# Patient Record
Sex: Male | Born: 1955 | Race: White | Hispanic: No | Marital: Married | Smoking: Never smoker
Health system: Southern US, Community
[De-identification: ages and names within clinical notes are randomized; demographics above are authoritative.]

## PROBLEM LIST (undated history)

## (undated) DIAGNOSIS — H409 Unspecified glaucoma: Secondary | ICD-10-CM

## (undated) DIAGNOSIS — K219 Gastro-esophageal reflux disease without esophagitis: Secondary | ICD-10-CM

## (undated) DIAGNOSIS — E119 Type 2 diabetes mellitus without complications: Secondary | ICD-10-CM

## (undated) DIAGNOSIS — I10 Essential (primary) hypertension: Secondary | ICD-10-CM

## (undated) DIAGNOSIS — D126 Benign neoplasm of colon, unspecified: Secondary | ICD-10-CM

## (undated) DIAGNOSIS — G4730 Sleep apnea, unspecified: Secondary | ICD-10-CM

## (undated) DIAGNOSIS — K649 Unspecified hemorrhoids: Secondary | ICD-10-CM

## (undated) DIAGNOSIS — R739 Hyperglycemia, unspecified: Secondary | ICD-10-CM

## (undated) DIAGNOSIS — E291 Testicular hypofunction: Secondary | ICD-10-CM

## (undated) DIAGNOSIS — E785 Hyperlipidemia, unspecified: Secondary | ICD-10-CM

## (undated) DIAGNOSIS — H269 Unspecified cataract: Secondary | ICD-10-CM

## (undated) DIAGNOSIS — K648 Other hemorrhoids: Secondary | ICD-10-CM

## (undated) HISTORY — DX: Essential (primary) hypertension: I10

## (undated) HISTORY — DX: Hyperglycemia, unspecified: R73.9

## (undated) HISTORY — DX: Sleep apnea, unspecified: G47.30

## (undated) HISTORY — DX: Unspecified glaucoma: H40.9

## (undated) HISTORY — DX: Type 2 diabetes mellitus without complications: E11.9

## (undated) HISTORY — PX: EYE SURGERY: SHX253

## (undated) HISTORY — DX: Hyperlipidemia, unspecified: E78.5

## (undated) HISTORY — PX: COLONOSCOPY: SHX174

## (undated) HISTORY — DX: Unspecified hemorrhoids: K64.9

## (undated) HISTORY — DX: Benign neoplasm of colon, unspecified: D12.6

## (undated) HISTORY — DX: Unspecified cataract: H26.9

## (undated) HISTORY — DX: Testicular hypofunction: E29.1

## (undated) HISTORY — DX: Other hemorrhoids: K64.8

## (undated) HISTORY — DX: Gastro-esophageal reflux disease without esophagitis: K21.9

---
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
## 1998-11-05 HISTORY — PX: LIPOMA EXCISION: SHX5283

## 2005-09-07 ENCOUNTER — Ambulatory Visit: Payer: Self-pay | Admitting: Family Medicine

## 2005-09-26 ENCOUNTER — Encounter: Admission: RE | Admit: 2005-09-26 | Discharge: 2005-09-26 | Payer: Self-pay | Admitting: Family Medicine

## 2005-10-19 ENCOUNTER — Ambulatory Visit: Payer: Self-pay | Admitting: Family Medicine

## 2006-07-27 ENCOUNTER — Emergency Department (HOSPITAL_COMMUNITY): Admission: EM | Admit: 2006-07-27 | Discharge: 2006-07-27 | Payer: Self-pay | Admitting: Emergency Medicine

## 2006-10-02 ENCOUNTER — Ambulatory Visit (HOSPITAL_COMMUNITY): Admission: RE | Admit: 2006-10-02 | Discharge: 2006-10-02 | Payer: Self-pay | Admitting: Gastroenterology

## 2008-02-25 ENCOUNTER — Ambulatory Visit (HOSPITAL_COMMUNITY): Admission: RE | Admit: 2008-02-25 | Discharge: 2008-02-25 | Payer: Self-pay | Admitting: Gastroenterology

## 2008-02-25 ENCOUNTER — Encounter (INDEPENDENT_AMBULATORY_CARE_PROVIDER_SITE_OTHER): Payer: Self-pay | Admitting: Gastroenterology

## 2008-10-26 ENCOUNTER — Emergency Department (HOSPITAL_COMMUNITY): Admission: EM | Admit: 2008-10-26 | Discharge: 2008-10-26 | Payer: Self-pay | Admitting: Emergency Medicine

## 2010-11-26 ENCOUNTER — Encounter: Payer: Self-pay | Admitting: Family Medicine

## 2011-01-09 LAB — FECAL OCCULT BLOOD, GUAIAC: Fecal Occult Blood: NEGATIVE

## 2011-01-23 ENCOUNTER — Encounter: Payer: Self-pay | Admitting: Family Medicine

## 2011-01-23 DIAGNOSIS — H409 Unspecified glaucoma: Secondary | ICD-10-CM | POA: Insufficient documentation

## 2011-01-23 DIAGNOSIS — E349 Endocrine disorder, unspecified: Secondary | ICD-10-CM | POA: Insufficient documentation

## 2011-01-23 DIAGNOSIS — I10 Essential (primary) hypertension: Secondary | ICD-10-CM | POA: Insufficient documentation

## 2011-01-23 DIAGNOSIS — E785 Hyperlipidemia, unspecified: Secondary | ICD-10-CM

## 2011-01-23 DIAGNOSIS — E291 Testicular hypofunction: Secondary | ICD-10-CM

## 2011-01-23 DIAGNOSIS — R739 Hyperglycemia, unspecified: Secondary | ICD-10-CM | POA: Insufficient documentation

## 2011-01-23 DIAGNOSIS — K649 Unspecified hemorrhoids: Secondary | ICD-10-CM | POA: Insufficient documentation

## 2011-01-23 DIAGNOSIS — K219 Gastro-esophageal reflux disease without esophagitis: Secondary | ICD-10-CM | POA: Insufficient documentation

## 2011-01-23 DIAGNOSIS — N400 Enlarged prostate without lower urinary tract symptoms: Secondary | ICD-10-CM | POA: Insufficient documentation

## 2011-03-20 NOTE — Op Note (Signed)
NAMECUINN, WESTERHOLD             ACCOUNT NO.:  [id number]   MEDICAL RECORD NO.:  [id number]          PATIENT TYPE:  AMB   LOCATION:  ENDO                         FACILITY:  [facility]   PHYSICIAN:  Anselmo Rod, M.D.  DATE OF BIRTH:  [birth year]   DATE OF PROCEDURE:  02/25/2008  DATE OF DISCHARGE:                               OPERATIVE REPORT   PROCEDURE PERFORMED:  Screening colonoscopy.   ENDOSCOPIST:  Anselmo Rod, M.D.   INSTRUMENT USED:  Pentax video panendoscope.   INDICATIONS FOR PROCEDURE:  This is a 55 year old white male underwent  a screening colonoscopy to rule out colonic polyps, mass, etc.   PREPROCEDURE PREPARATION:  Informed consent was obtained from the  patient and the patient had fasted for four hours prior to the procedure  and prep with 20 Osmoprep pills the night before and 12 pills the  morning of the procedure. The risks and benefits of the procedure  including a 10% risk for cancer and polyp were discussed with the  patient as well.   PREPROCEDURE PHYSICAL:  VITAL SIGNS: The patient had stable vital signs.  NECK: Supple.  CHEST: Clear to auscultation.  HEART: S1, S2 regular.  ABDOMEN: Soft with normal bowel sounds.   DESCRIPTION OF PROCEDURE:  The patient was placed in the left lateral  decubitus position. Sedated with Fentanyl and Versed for the EGD. No  additional sedation  was used for the colonoscopy. Once the patient was  adequately sedated and maintained on normal flow oxygen, cardiac  monitoring. The Pentax video colonoscope was advanced from the rectum to  the cecum. The appendiceal orifice was thoroughly visualized and  photographed. The terminal ileum appeared healthy without lesions. No  masses, polyps, erosions, ulcerations or diverticula were noted.  Small  internal hemorrhoids were appreciated on retroflexion in the rectum. The  patient tolerated the procedure well without any complications.   IMPRESSION:  Normal colonoscopy of  the terminal  ileum except for small  internal hemorrhoids seen on retroflexion.   RECOMMENDATIONS:  1. Continue a high fiber diet with adequate fluid intake.  2. Repeat colonoscopy in the next ten years unless the patient has any      abnormal symptoms in which case he should contact the office      immediately for  further recommendations.      Anselmo Rod, M.D.  Electronically Signed     JNM/MEDQ  D:  02/26/2008  T:  02/26/2008  Job:  [id number]   cc:   Ernestina Penna, M.D.  Fax: [phone]

## 2011-03-23 NOTE — Op Note (Signed)
Terry [name], Terry [name]             ACCOUNT NO.:  [id number]   MEDICAL RECORD NO.:  [id number]          PATIENT TYPE:  AMB   LOCATION:  ENDO                         FACILITY:  [facility]   PHYSICIAN:  Anselmo Rod, M.D.  DATE OF BIRTH:  1955-11-08   DATE OF PROCEDURE:  02/26/2008  DATE OF DISCHARGE:                               OPERATIVE REPORT   PROCEDURE PERFORMED:  Esophagogastroduodenoscopy with gastric biopsies.   ENDOSCOPIST:  Anselmo Rod, M.D.   INSTRUMENT USED:  Pentax video panendoscope.   INDICATIONS FOR PROCEDURE:  A 55 year old white male with a history of  epigastric pain, reflux on PPI undergoing EGD to rule out peptic ulcer  disease, esophagitis, gastritis, etc.   PREPROCEDURE PREPARATION:  Informed consent was procured from the  patient.  The patient fasted for 4 hours prior to the procedure and  risks and benefits of the procedure were discussed with the patient in  detail.   PREPROCEDURE PHYSICAL:  VITAL SIGNS:  Patient with stable vital signs.  NECK:  Supple.  CHEST:  Clear to auscultation.  HEART:  S1, S2 regular.  ABDOMEN:  Soft with normal bowel sounds.   DESCRIPTION OF PROCEDURE:  The patient was placed in the left lateral  decubitus position and sedated with 75 mcg of fentanyl and 5 mg of  Versed given intravenously in slow incremental doses.  Once the patient  was adequately sedated, maintained on low-flow oxygen, and continuous  cardiac monitoring, the Pentax video panendoscope was advanced through  the mouthpiece over the tongue into the esophagus under direct vision.  Grade 1 distal esophagitis was noted the rest of the esophagus was  widely patent with no evidence of ulcers or erosions.  The scope was  then advanced into the stomach.  Moderate diffuse gastritis was noted.  Antral biopsies were done to rule out the presence of H. pylori by  pathology.  The proximal small bowel appeared normal.  Retroflexion in  the high cardia revealed no  abnormalities.  The patient tolerated the  procedure well without immediate complications.   IMPRESSION:  1. Grade 1 distal esophagitis.  2. Diffuse gastritis.  Antral biopsies done for Helicobacter pylori.  3. Normal proximal small bowel.   RECOMMENDATIONS:  1. Continue PPI.  2. Avoid nonsteroidals.  3. Await pathology results.  4. Treat with antibiotics if H. pylori present on biopsies.  5. Proceed with a colonoscopy at this time.  Further recommendations      to be made thereafter.      Anselmo Rod, M.D.  Electronically Signed     JNM/MEDQ  D:  02/26/2008  T:  02/26/2008  Job:  [id number]   cc:   Ernestina Penna, M.D.  Fax: [phone]

## 2011-03-23 NOTE — Op Note (Signed)
NAMELAVONNE, CASS             ACCOUNT NO.:  [id number]   MEDICAL RECORD NO.:  [id number]          PATIENT TYPE:  AMB   LOCATION:  ENDO                         FACILITY:  [facility]   PHYSICIAN:  Anselmo Rod, M.D.  DATE OF BIRTH:  1956/01/22   DATE OF PROCEDURE:  10/02/2006  DATE OF DISCHARGE:                               OPERATIVE REPORT   PROCEDURE PERFORMED:  A screening colonoscopy.   ENDOSCOPIST:  Anselmo Rod, M.D.   INSTRUMENT USED:  Olympus video colonoscope.   INDICATIONS FOR PROCEDURE:  55 year old white male undergoing screening  colonoscopy to rule out colonic polyps, mass, etc.   PREPROCEDURE PREPARATION:  Informed consent was procured from the  patient.  The patient was fasted for 8 hours prior to the procedure and  prepped with Dulcolax pills and a gallon of TriLyte the night prior to  procedure.  Risks and benefits of the procedure including a 10% miss  rate of cancer and polyp were discussed with the patient as well.   PREPROCEDURE PHYSICAL:  The patient had stable vital signs.  NECK:  Supple.  Chest clear to auscultation.  S1, S2 regular.  Abdomen soft  with normal bowel sounds.   DESCRIPTION OF PROCEDURE:  The patient was placed in the left lateral  decubitus position and sedated with 100 mcg of fentanyl and 7.5 mg of  Versed given intravenously in slow incremental doses.  Once the patient  was adequately sedated and maintained on low-flow oxygen and continuous  cardiac monitoring, the Olympus video colonoscope was advanced from the  rectum to cecum.  The appendiceal orifice and ileocecal valve were  visualized and photographed.  The patient has significant amount of  residual stool in the colon.  No masses, polyps, erosions, ulcerations  or diverticula were seen.  Small internal hemorrhoids were seen on  retroflexion in the rectum.  The patient tolerated the procedure well  without complication.  Small lesions could be missed.   IMPRESSION:  1.  Normal colonoscopy up to the cecum.  No masses, polyps or      diverticula seen.  2. Small internal hemorrhoids seen on retroflexion.  3. Large amount of residual stool in the colon.  Small lesions could      be missed.   RECOMMENDATIONS:  1. Continue high fiber diet with liberal fluid intake.  2. Repeat colonoscopy in the 5 years unless the patient develops any      abnormal symptoms in interim.  3. Outpatient follow-up as need arises in the future.      Anselmo Rod, M.D.  Electronically Signed     JNM/MEDQ  D:  10/02/2006  T:  10/02/2006  Job:  [id number]   cc:   Fleet Contras, M.D.

## 2011-08-10 LAB — URINALYSIS, ROUTINE W REFLEX MICROSCOPIC
Bilirubin Urine: NEGATIVE
Glucose, UA: NEGATIVE mg/dL
Ketones, ur: NEGATIVE mg/dL
Protein, ur: NEGATIVE mg/dL

## 2013-02-18 ENCOUNTER — Telehealth: Payer: Self-pay | Admitting: Family Medicine

## 2013-02-18 NOTE — Telephone Encounter (Signed)
LMOM

## 2013-02-19 ENCOUNTER — Ambulatory Visit (INDEPENDENT_AMBULATORY_CARE_PROVIDER_SITE_OTHER): Admitting: Family Medicine

## 2013-02-19 ENCOUNTER — Encounter: Payer: Self-pay | Admitting: Family Medicine

## 2013-02-19 VITALS — BP 118/70 | HR 52 | Temp 98.0°F | Ht 68.25 in | Wt 177.2 lb

## 2013-02-19 DIAGNOSIS — I10 Essential (primary) hypertension: Secondary | ICD-10-CM

## 2013-02-19 DIAGNOSIS — R7309 Other abnormal glucose: Secondary | ICD-10-CM

## 2013-02-19 DIAGNOSIS — E349 Endocrine disorder, unspecified: Secondary | ICD-10-CM

## 2013-02-19 DIAGNOSIS — E785 Hyperlipidemia, unspecified: Secondary | ICD-10-CM

## 2013-02-19 DIAGNOSIS — Z79899 Other long term (current) drug therapy: Secondary | ICD-10-CM

## 2013-02-19 DIAGNOSIS — E291 Testicular hypofunction: Secondary | ICD-10-CM

## 2013-02-19 DIAGNOSIS — K649 Unspecified hemorrhoids: Secondary | ICD-10-CM

## 2013-02-19 DIAGNOSIS — E559 Vitamin D deficiency, unspecified: Secondary | ICD-10-CM

## 2013-02-19 LAB — POCT CBC
Granulocyte percent: 45.1 %G (ref 37–80)
Lymph, poc: 3 (ref 0.6–3.4)
MCV: 90.2 fL (ref 80–97)
MPV: 8.6 fL (ref 0–99.8)
POC Granulocyte: 2.8 (ref 2–6.9)
Platelet Count, POC: 248 10*3/uL (ref 142–424)
RBC: 4.7 M/uL (ref 4.69–6.13)
RDW, POC: 13.3 %

## 2013-02-19 LAB — HEPATIC FUNCTION PANEL
Bilirubin, Direct: 0.1 mg/dL (ref 0.0–0.3)
Indirect Bilirubin: 0.5 mg/dL (ref 0.0–0.9)
Total Bilirubin: 0.6 mg/dL (ref 0.3–1.2)

## 2013-02-19 LAB — BASIC METABOLIC PANEL WITH GFR
Chloride: 104 mEq/L (ref 96–112)
GFR, Est African American: >89 mL/min
GFR, Est Non African American: 79 mL/min
Potassium: 4.7 mEq/L (ref 3.5–5.3)

## 2013-02-19 NOTE — Patient Instructions (Addendum)
Continue current meds and therapeutic lifestyle changes Will hold Testim, see how he feels until we see him the next visit For allergic rhinitis try Nasacort AQ over-the-counter 1-2 sprays each nostril daily

## 2013-02-19 NOTE — Telephone Encounter (Signed)
Wants [place] to send labs to participating lab with their insurance She will find out which lab this is and let us know for the future

## 2013-02-19 NOTE — Progress Notes (Signed)
  Subjective:    Patient ID: Terry [name], male    DOB: March 03, 1956, 57 y.o.   MRN: [id number]  HPI This patient presents for recheck of multiple medical problems. No one accompanies the patient today.  Patient Active Problem List  Diagnosis  . Esophageal reflux  . Elevated blood sugar  . BPH (benign prostatic hyperplasia)  . Unspecified hemorrhoids without mention of complication  . testosterone deficiency  . hyperlipidemia  . Essential hypertension, benign  . Unspecified glaucoma    In addition, see ROS. Also has concerns about recurring hemorrhoid problems. That he ran out of testim  about a month ago, and he is not felt any different energy wise.  The allergies, current medications, past medical history, surgical history, family and social history are reviewed.  Immunizations reviewed.  Health maintenance reviewed.  The following items are outstanding:none.      Review of Systems  Constitutional: Negative.   HENT: Negative.   Eyes: Negative.   Respiratory: Negative.   Cardiovascular: Negative.   Gastrointestinal: Negative.   Genitourinary: Negative.   Musculoskeletal: Negative.   Allergic/Immunologic: Positive for environmental allergies (sesonal).  Neurological: Negative.   Psychiatric/Behavioral: Negative.        Objective:   Physical Exam BP 118/70  Pulse 52  Temp(Src) 98 F (36.7 C) (Oral)  Ht 5' 8.25" (1.734 m)  Wt 177 lb 3.2 oz (80.377 kg)  BMI 26.73 kg/m2  The patient appeared well nourished and normally developed, alert and oriented to time and place. Speech, behavior and judgement appear normal. Vital signs as documented.  Head exam is unremarkable. No scleral icterus or pallor noted. Nasal congestion bilaterally. Neck is without jugular venous distension, thyromegally, or carotid bruits. Carotid upstrokes are brisk bilaterally. No cervical adenopathy. Lungs are clear anteriorly and posteriorly to auscultation. Normal respiratory  effort. Cardiac exam reveals regular rate and rhythm. First and second heart sounds normal. No murmurs, rubs or gallops.  Abdominal exam reveals normal bowl sounds, no masses, no organomegaly and no aortic enlargement. No inguinal adenopathy.He has small external hemorrhoids. Extremities are nonedematous and both femoral and pedal pulses are normal. Skin without pallor or jaundice.  Warm and dry, without rash. Neurologic exam reveals normal deep tendon reflexes and normal sensation. Diabetic foot exam done today.         Assessment & Plan:  1. Essential hypertension, benign  2. Hyperlipemia - Hepatic function panel; Standing - NMR Lipoprofile with Lipids; Standing - Hepatic function panel - NMR Lipoprofile with Lipids  3. Testosterone deficiency - Testosterone, Total & Free Direct  4. Vitamin D deficiency - Vitamin D 25 hydroxy; Standing - Vitamin D 25 hydroxy  5. High risk medication use - POCT CBC; Standing - BASIC METABOLIC PANEL WITH GFR; Standing - POCT CBC - BASIC METABOLIC PANEL WITH GFR  6. Hemorrhoids Continue using hemorrhoid creams. Problem gets worse patient will let me know and we will get a surgeon to see him Patient Instructions  Continue current meds and therapeutic lifestyle changes Will hold Testim, see how he feels until we see him the next visit For allergic rhinitis try Nasacort AQ over-the-counter 1-2 sprays each nostril daily

## 2013-02-20 LAB — NMR LIPOPROFILE WITH LIPIDS
Cholesterol, Total: 132 mg/dL (ref <200)
HDL Particle Number: 46 umol/L (ref >=30.5)
HDL Size: 9.1 nm — ABNORMAL LOW (ref >=9.2)
HDL-C: 57 mg/dL (ref >=40)
LDL (calc): 58 mg/dL (ref <100)
LDL Particle Number: 646 nmol/L (ref <1000)
LDL Size: 19.7 nm — ABNORMAL LOW (ref >20.5)
LP-IR Score: 44 (ref <=45)
Large HDL-P: 3.2 umol/L — ABNORMAL LOW (ref >=4.8)
Large VLDL-P: 1.7 nmol/L (ref <=2.7)
Small LDL Particle Number: 491 nmol/L (ref <=527)
Triglycerides: 87 mg/dL (ref <150)
VLDL Size: 44.6 nm (ref <=46.6)

## 2013-02-20 LAB — VITAMIN D 25 HYDROXY (VIT D DEFICIENCY, FRACTURES): Vit D, 25-Hydroxy: 59 ng/mL (ref 30–89)

## 2013-02-24 LAB — POCT GLYCOSYLATED HEMOGLOBIN (HGB A1C): Hemoglobin A1C: 5.6

## 2013-02-24 NOTE — Addendum Note (Signed)
Addended by: Orma Render F on: 02/24/2013 03:20 PM   Modules accepted: Orders

## 2013-03-11 ENCOUNTER — Other Ambulatory Visit: Payer: Self-pay | Admitting: *Deleted

## 2013-03-11 MED ORDER — CANDESARTAN CILEXETIL 16 MG PO TABS: 16.0000 mg | ORAL_TABLET | Freq: Every day | ORAL | Status: DC

## 2013-04-15 ENCOUNTER — Other Ambulatory Visit: Payer: Self-pay | Admitting: *Deleted

## 2013-04-15 MED ORDER — OMEGA-3-ACID ETHYL ESTERS 1 G PO CAPS: 2.0000 g | ORAL_CAPSULE | Freq: Two times a day (BID) | ORAL | Status: DC

## 2013-04-15 NOTE — Telephone Encounter (Signed)
Have kay call pt. When printed to pick up at [phone]

## 2013-04-16 NOTE — Telephone Encounter (Signed)
Pt.notified

## 2013-04-28 ENCOUNTER — Other Ambulatory Visit: Payer: Self-pay | Admitting: Family Medicine

## 2013-06-22 ENCOUNTER — Ambulatory Visit: Admitting: Family Medicine

## 2013-07-02 ENCOUNTER — Other Ambulatory Visit: Payer: Self-pay | Admitting: Family Medicine

## 2013-07-16 ENCOUNTER — Ambulatory Visit: Payer: Self-pay | Admitting: Family Medicine

## 2013-08-24 ENCOUNTER — Ambulatory Visit (INDEPENDENT_AMBULATORY_CARE_PROVIDER_SITE_OTHER)

## 2013-08-24 ENCOUNTER — Ambulatory Visit (INDEPENDENT_AMBULATORY_CARE_PROVIDER_SITE_OTHER): Admitting: Family Medicine

## 2013-08-24 ENCOUNTER — Encounter (INDEPENDENT_AMBULATORY_CARE_PROVIDER_SITE_OTHER): Payer: Self-pay

## 2013-08-24 ENCOUNTER — Encounter: Payer: Self-pay | Admitting: Family Medicine

## 2013-08-24 VITALS — BP 135/85 | HR 69 | Temp 97.5°F | Ht 68.25 in | Wt 179.0 lb

## 2013-08-24 DIAGNOSIS — N400 Enlarged prostate without lower urinary tract symptoms: Secondary | ICD-10-CM

## 2013-08-24 DIAGNOSIS — K219 Gastro-esophageal reflux disease without esophagitis: Secondary | ICD-10-CM

## 2013-08-24 DIAGNOSIS — E559 Vitamin D deficiency, unspecified: Secondary | ICD-10-CM

## 2013-08-24 DIAGNOSIS — I10 Essential (primary) hypertension: Secondary | ICD-10-CM

## 2013-08-24 DIAGNOSIS — E119 Type 2 diabetes mellitus without complications: Secondary | ICD-10-CM

## 2013-08-24 DIAGNOSIS — E291 Testicular hypofunction: Secondary | ICD-10-CM

## 2013-08-24 DIAGNOSIS — E785 Hyperlipidemia, unspecified: Secondary | ICD-10-CM

## 2013-08-24 MED ORDER — GLUCOSE BLOOD VI STRP: ORAL_STRIP | Status: DC

## 2013-08-24 NOTE — Patient Instructions (Addendum)
Continue current medications. Continue good therapeutic lifestyle changes.  Fall precautions discussed with patient. Follow up as planned and earlier as needed.  You will be given FOBT today to return.  Try to work on the weight and get more exercise

## 2013-08-24 NOTE — Addendum Note (Signed)
Addended by: Magdalene River on: 08/24/2013 11:04 AM   Modules accepted: Orders

## 2013-08-24 NOTE — Progress Notes (Signed)
Subjective:    Patient ID: Lavance Beazer, male    DOB: 1956-06-29, 57 y.o.   MRN: [id number]  HPI Pt here for follow up and management of chronic medical problems. Patient notes that he has been under a lot of stress over the past several months. He had a recent administer retreat and indicates this is helping some. Also of note he has not been using the Testim for several months and he says that being off of this he cannot tell that it helped him anymore than being on it so he continues to not use the Testim he brings in his blood sugars and blood pressures from outside and these were reviewed and will be scanned into the chart.      Patient Active Problem List   Diagnosis Date Noted  . Esophageal reflux   . Elevated blood sugar   . BPH (benign prostatic hyperplasia)   . Unspecified hemorrhoids without mention of complication   . testosterone deficiency   . hyperlipidemia   . Essential hypertension, benign   . Unspecified glaucoma    Outpatient Encounter Prescriptions as of 08/24/2013  Medication Sig Dispense Refill  . aspirin 81 MG EC tablet Take 81 mg by mouth daily.        Marland Kitchen atorvastatin (LIPITOR) 20 MG tablet TAKE 1 TABLET DAILY AS DIRECTED  90 tablet  1  . candesartan (ATACAND) 16 MG tablet Take 1 tablet (16 mg total) by mouth daily.  90 tablet  4  . Cholecalciferol (VITAMIN D3) 2000 UNITS TABS Take 2 tablets by mouth daily.       Marland Kitchen omega-3 acid ethyl esters (LOVAZA) 1 G capsule Take 2 capsules (2 g total) by mouth 2 (two) times daily.  480 capsule  3  . TRILIPIX 135 MG capsule TAKE 1 CAPSULE DAILY  90 capsule  0  . testosterone (TESTIM) 50 MG/5GM GEL Place 5 g onto the skin daily.       No facility-administered encounter medications on file as of 08/24/2013.    Review of Systems  Constitutional: Negative.   HENT: Negative.   Eyes: Negative.   Respiratory: Negative.   Cardiovascular: Negative.   Gastrointestinal: Negative.   Endocrine: Negative.   Genitourinary:  Negative.   Musculoskeletal: Negative.   Skin: Negative.   Allergic/Immunologic: Negative.   Neurological: Negative.   Hematological: Negative.   Psychiatric/Behavioral: Negative.        Objective:   Physical Exam  Nursing note and vitals reviewed. Constitutional: He is oriented to person, place, and time. He appears well-developed and well-nourished. No distress.  HENT:  Head: Normocephalic and atraumatic.  Right Ear: External ear normal.  Left Ear: External ear normal.  Nose: Nose normal.  Mouth/Throat: Oropharynx is clear and moist. No oropharyngeal exudate.  Eyes: Conjunctivae and EOM are normal. Right eye exhibits no discharge. Left eye exhibits no discharge. No scleral icterus.  Neck: Normal range of motion. Neck supple. No tracheal deviation present. No thyromegaly present.  No bruits in the neck  Cardiovascular: Normal rate, regular rhythm, normal heart sounds and intact distal pulses.  Exam reveals no gallop and no friction rub.   No murmur heard. At 84 per minute  Pulmonary/Chest: Effort normal and breath sounds normal. No respiratory distress. He has no wheezes. He has no rales.  No axillary nodes  Abdominal: Soft. Bowel sounds are normal. He exhibits no distension and no mass. There is no tenderness. There is no rebound and no guarding.  Musculoskeletal: Normal range of  motion. He exhibits no edema and no tenderness.  Lymphadenopathy:    He has no cervical adenopathy.  Neurological: He is alert and oriented to person, place, and time. He has normal reflexes. No cranial nerve deficit.  Skin: Skin is warm and dry. No rash noted. No erythema. No pallor.  Psychiatric: He has a normal mood and affect. His behavior is normal. Judgment and thought content normal.   BP 135/85  Pulse 69  Temp(Src) 97.5 F (36.4 C) (Oral)  Ht 5' 8.25" (1.734 m)  Wt 179 lb (81.194 kg)  BMI 27 kg/m2  A. diabetic foot exam was done today.      Assessment & Plan:    1. Essential  hypertension, benign   2. Esophageal reflux   3. BPH (benign prostatic hyperplasia)   4. testosterone deficiency   5. hyperlipidemia   6. Vitamin D deficiency   7. Diabetes mellitus type 2, controlled    Orders Placed This Encounter  Procedures  . DG Chest 2 View    Standing Status: Future     Number of Occurrences:      Standing Expiration Date: 10/24/2014    Order Specific Question:  Reason for Exam (SYMPTOM  OR DIAGNOSIS REQUIRED)    Answer:  htn    Order Specific Question:  Preferred imaging location?    Answer:  Internal  . Hepatic function panel  . BMP8+EGFR  . NMR, lipoprofile  . Vit D  25 hydroxy (rtn osteoporosis monitoring)  . POCT CBC   Meds ordered this encounter  Medications  . DISCONTD: glucose blood (EQL TRUETEST TEST) test strip    Sig: Use as instructed- check blood sugar BID and PRN    Dispense:  100 each    Refill:  12  . glucose blood (EQL TRUETEST TEST) test strip    Sig: Use as instructed- check blood sugar BID and PRN    Dispense:  300 each    Refill:  3   Nyra Capes MD

## 2013-09-13 ENCOUNTER — Other Ambulatory Visit: Payer: Self-pay | Admitting: Family Medicine

## 2013-09-25 ENCOUNTER — Other Ambulatory Visit

## 2013-10-15 ENCOUNTER — Other Ambulatory Visit (INDEPENDENT_AMBULATORY_CARE_PROVIDER_SITE_OTHER)

## 2013-10-15 DIAGNOSIS — E119 Type 2 diabetes mellitus without complications: Secondary | ICD-10-CM

## 2013-10-15 DIAGNOSIS — K219 Gastro-esophageal reflux disease without esophagitis: Secondary | ICD-10-CM

## 2013-10-15 DIAGNOSIS — I10 Essential (primary) hypertension: Secondary | ICD-10-CM

## 2013-10-15 DIAGNOSIS — E785 Hyperlipidemia, unspecified: Secondary | ICD-10-CM

## 2013-10-15 DIAGNOSIS — N400 Enlarged prostate without lower urinary tract symptoms: Secondary | ICD-10-CM

## 2013-10-15 DIAGNOSIS — E559 Vitamin D deficiency, unspecified: Secondary | ICD-10-CM

## 2013-10-15 DIAGNOSIS — Z1212 Encounter for screening for malignant neoplasm of rectum: Secondary | ICD-10-CM

## 2013-10-15 DIAGNOSIS — E291 Testicular hypofunction: Secondary | ICD-10-CM

## 2013-10-16 LAB — CBC WITH DIFFERENTIAL/PLATELET
Basophils Absolute: 0 10*3/uL (ref 0.0–0.2)
Eosinophils Absolute: 0.1 10*3/uL (ref 0.0–0.4)
HCT: 40.2 % (ref 37.5–51.0)
Immature Grans (Abs): 0 10*3/uL (ref 0.0–0.1)
Immature Granulocytes: 0 %
Lymphs: 49 %
MCH: 30.4 pg (ref 26.6–33.0)
MCV: 89 fL (ref 79–97)
Monocytes Absolute: 0.4 10*3/uL (ref 0.1–0.9)
Monocytes: 6 %
Neutrophils Absolute: 2.5 10*3/uL (ref 1.4–7.0)
Neutrophils Relative %: 43 %
RBC: 4.51 x10E6/uL (ref 4.14–5.80)
RDW: 13.4 % (ref 12.3–15.4)
WBC: 5.8 10*3/uL (ref 3.4–10.8)

## 2013-10-18 LAB — BMP8+EGFR
BUN/Creatinine Ratio: 24 — ABNORMAL HIGH (ref 9–20)
BUN: 25 mg/dL — ABNORMAL HIGH (ref 6–24)
CO2: 23 mmol/L (ref 18–29)
Calcium: 10.2 mg/dL (ref 8.7–10.2)
Chloride: 100 mmol/L (ref 97–108)
Creatinine, Ser: 1.05 mg/dL (ref 0.76–1.27)
GFR calc non Af Amer: 78 mL/min/{1.73_m2} (ref >59)
Sodium: 142 mmol/L (ref 134–144)

## 2013-10-18 LAB — PSA, TOTAL AND FREE
PSA, Free: 0.78 ng/mL
PSA: 1.7 ng/mL (ref 0.0–4.0)

## 2013-10-18 LAB — TESTOSTERONE,FREE AND TOTAL: Testosterone: 202 ng/dL — ABNORMAL LOW (ref 348–1197)

## 2013-10-18 LAB — NMR, LIPOPROFILE
HDL Cholesterol by NMR: 56 mg/dL (ref >=40)
HDL Particle Number: 45.7 umol/L (ref >=30.5)
LDL Particle Number: 988 nmol/L (ref <1000)
LDL Size: 20.5 nm — ABNORMAL LOW (ref >20.5)
LDLC SERPL CALC-MCNC: 56 mg/dL (ref <100)
Triglycerides by NMR: 133 mg/dL (ref <150)

## 2013-10-18 LAB — HEPATIC FUNCTION PANEL
ALT: 41 IU/L (ref 0–44)
AST: 26 IU/L (ref 0–40)
Albumin: 5 g/dL (ref 3.5–5.5)
Bilirubin, Direct: 0.13 mg/dL (ref 0.00–0.40)

## 2013-11-03 ENCOUNTER — Telehealth: Payer: Self-pay | Admitting: Family Medicine

## 2013-11-03 NOTE — Telephone Encounter (Signed)
Message copied by Azalee Course on Tue Nov 03, 2013  2:25 PM ------      Message from: Ernestina Penna      Created: Sun Oct 18, 2013 10:49 AM       LFTs are within normal limits      The blood sugar is elevated at 120. The creatinine and electrolytes are within normal limit      Advanced lipid testing the total LDL particle number, which is the most important number on this profile, is at goal of less than 1000. Triglycerides are slightly elevated at 133. The HDL particle number is excellent.------- continue current medication and aggressive therapeutic lifestyle changes      the PSA is 1.7-----os is low and within the normal range.++++++++ please compare this reading to his previous PSA value in the paper chart+++++++      The. free and total testosterone are in the low range.------- please confirm with patient if he is using the Testim gel topically?????????????--- we may need to increase this      Vitamin D level was 39.8, have patient increase his vitamin D3 1000 daily ------

## 2013-11-23 ENCOUNTER — Encounter: Payer: Self-pay | Admitting: *Deleted

## 2013-12-28 ENCOUNTER — Ambulatory Visit: Admitting: Family Medicine

## 2014-03-06 ENCOUNTER — Other Ambulatory Visit: Payer: Self-pay | Admitting: Family Medicine

## 2014-03-17 ENCOUNTER — Ambulatory Visit: Admitting: Family Medicine

## 2014-04-06 ENCOUNTER — Other Ambulatory Visit: Payer: Self-pay | Admitting: *Deleted

## 2014-04-06 MED ORDER — TRILIPIX 135 MG PO CPDR: DELAYED_RELEASE_CAPSULE | ORAL | Status: DC

## 2014-04-06 NOTE — Telephone Encounter (Signed)
Done and up front- pt aware to pick up from the front

## 2014-04-10 ENCOUNTER — Other Ambulatory Visit: Payer: Self-pay | Admitting: Family Medicine

## 2014-04-12 NOTE — Telephone Encounter (Signed)
Patient last seen in office on 08-24-13. Please advise on refill

## 2014-07-08 ENCOUNTER — Encounter: Payer: Self-pay | Admitting: Family Medicine

## 2014-07-08 ENCOUNTER — Ambulatory Visit (INDEPENDENT_AMBULATORY_CARE_PROVIDER_SITE_OTHER): Admitting: Family Medicine

## 2014-07-08 VITALS — BP 127/79 | HR 51 | Temp 98.3°F | Ht 68.25 in | Wt 178.0 lb

## 2014-07-08 DIAGNOSIS — N400 Enlarged prostate without lower urinary tract symptoms: Secondary | ICD-10-CM

## 2014-07-08 DIAGNOSIS — I10 Essential (primary) hypertension: Secondary | ICD-10-CM

## 2014-07-08 DIAGNOSIS — R739 Hyperglycemia, unspecified: Secondary | ICD-10-CM

## 2014-07-08 DIAGNOSIS — J302 Other seasonal allergic rhinitis: Secondary | ICD-10-CM

## 2014-07-08 DIAGNOSIS — R7309 Other abnormal glucose: Secondary | ICD-10-CM

## 2014-07-08 DIAGNOSIS — E785 Hyperlipidemia, unspecified: Secondary | ICD-10-CM

## 2014-07-08 DIAGNOSIS — E559 Vitamin D deficiency, unspecified: Secondary | ICD-10-CM

## 2014-07-08 DIAGNOSIS — Z0000 Encounter for general adult medical examination without abnormal findings: Secondary | ICD-10-CM

## 2014-07-08 DIAGNOSIS — E291 Testicular hypofunction: Secondary | ICD-10-CM

## 2014-07-08 DIAGNOSIS — J3089 Other allergic rhinitis: Secondary | ICD-10-CM

## 2014-07-08 LAB — POCT CBC
GRANULOCYTE PERCENT: 51 % (ref 37–80)
HCT, POC: 39.1 % — AB (ref 43.5–53.7)
Hemoglobin: 13.9 g/dL — AB (ref 14.1–18.1)
LYMPH, POC: 2.1 (ref 0.6–3.4)
MCH, POC: 31.3 pg — AB (ref 27–31.2)
MCHC: 35.4 g/dL (ref 31.8–35.4)
MCV: 88.4 fL (ref 80–97)
MPV: 8.8 fL (ref 0–99.8)
PLATELET COUNT, POC: 212 10*3/uL (ref 142–424)
POC GRANULOCYTE: 2.5 (ref 2–6.9)
POC LYMPH %: 42.3 % (ref 10–50)
RBC: 4.4 M/uL — AB (ref 4.69–6.13)
RDW, POC: 12.9 %
WBC: 4.9 10*3/uL (ref 4.6–10.2)

## 2014-07-08 LAB — POCT UA - MICROSCOPIC ONLY
Bacteria, U Microscopic: NEGATIVE
Casts, Ur, LPF, POC: NEGATIVE
Crystals, Ur, HPF, POC: NEGATIVE
Mucus, UA: NEGATIVE
RBC, urine, microscopic: NEGATIVE
YEAST UA: NEGATIVE

## 2014-07-08 LAB — POCT URINALYSIS DIPSTICK
BILIRUBIN UA: NEGATIVE
Blood, UA: NEGATIVE
Glucose, UA: NEGATIVE
Ketones, UA: NEGATIVE
LEUKOCYTES UA: NEGATIVE
NITRITE UA: NEGATIVE
PROTEIN UA: NEGATIVE
Spec Grav, UA: 1.015
Urobilinogen, UA: NEGATIVE
pH, UA: 6.5

## 2014-07-08 LAB — POCT GLYCOSYLATED HEMOGLOBIN (HGB A1C): Hemoglobin A1C: 6.1

## 2014-07-08 MED ORDER — TRILIPIX 135 MG PO CPDR: DELAYED_RELEASE_CAPSULE | ORAL | Status: DC

## 2014-07-08 MED ORDER — CANDESARTAN CILEXETIL 16 MG PO TABS: ORAL_TABLET | ORAL | Status: DC

## 2014-07-08 NOTE — Patient Instructions (Addendum)
Continue current medications. Continue good therapeutic lifestyle changes which include good diet and exercise. Fall precautions discussed with patient. If an FOBT was given today- please return it to our front desk. If you are over 58 years old - you may need Prevnar 31 or the adult Pneumonia vaccine.  Flu Shots will be available at our office starting mid- September. Please call and schedule a FLU CLINIC APPOINTMENT.   Continue to monitor blood pressures and blood sugars Although strength plenty of fluids to keep constipation at a minimum If problems with the hemorrhoids were snoring get worse call us back and we will further evaluate these at the time If you develop any increasing daytime fatigue, we will consider getting a sleep study. Keep using saline nose spray and during allergy season, he may want to use your Flonase or fluticasone more regularly. This is over-the-counter. Continue to stay active physically and keep your weight down Check with your insurance regarding the Prevnar vaccine and the Zostavax vaccine

## 2014-07-08 NOTE — Progress Notes (Signed)
Subjective:    Patient ID: Terry [name], male    DOB: 1956/04/03, 58 y.o.   MRN: [id number]  HPI Patient is here today for annual wellness exam and follow up of chronic medical problems. He does complain of increased snoring issues. He also complains of hemorrhoids. He needs to his medications refill. He will get lab work today. He'll also get a urine specimen. He will check with his insurance regarding the Prevnar vaccine and shingles shot . He brings in blood sugars and blood pressures for review the before breakfast blood sugars are running anywhere from 101 12/06/2012. They are not above 100 during the day except one time after breakfast it was 103. Blood pressures at home are running anywhere from 119/74 up to as high as 147/92. His weight is stable.          Patient Active Problem List   Diagnosis Date Noted  . Esophageal reflux   . Elevated blood sugar   . BPH (benign prostatic hyperplasia)   . Unspecified hemorrhoids without mention of complication   . testosterone deficiency   . hyperlipidemia   . Essential hypertension, benign   . Unspecified glaucoma    Outpatient Encounter Prescriptions as of 07/08/2014  Medication Sig  . aspirin 81 MG EC tablet Take 81 mg by mouth daily.    Marland Kitchen atorvastatin (LIPITOR) 20 MG tablet TAKE 1 TABLET DAILY AS DIRECTED  . candesartan (ATACAND) 16 MG tablet TAKE 1 TABLET DAILY  . Cholecalciferol (VITAMIN D3) 2000 UNITS TABS Take 2 tablets by mouth daily.   Marland Kitchen glucose blood (EQL TRUETEST TEST) test strip Use as instructed- check blood sugar BID and PRN  . LOVAZA 1 G capsule TAKE 2 CAPSULES (2 GRAMS) TWICE A DAY  . TRILIPIX 135 MG capsule TAKE 1 CAPSULE DAILY  . [DISCONTINUED] testosterone (TESTIM) 50 MG/5GM GEL Place 5 g onto the skin daily.    Review of Systems  Constitutional: Negative.   HENT: Negative.   Eyes: Negative.   Respiratory: Negative.        Snoring  Cardiovascular: Negative.   Gastrointestinal: Negative.    Hemmorrhoids  Endocrine: Negative.   Genitourinary: Negative.   Musculoskeletal: Negative.   Skin: Negative.   Allergic/Immunologic: Negative.   Neurological: Negative.   Hematological: Negative.   Psychiatric/Behavioral: Negative.        Objective:   Physical Exam  Nursing note and vitals reviewed. Constitutional: He is oriented to person, place, and time. He appears well-developed and well-nourished. No distress.  HENT:  Head: Normocephalic and atraumatic.  Right Ear: External ear normal.  Left Ear: External ear normal.  Mouth/Throat: Oropharynx is clear and moist. No oropharyngeal exudate.  There is nasal congestion bilateral  Eyes: Conjunctivae and EOM are normal. Pupils are equal, round, and reactive to light. Right eye exhibits no discharge. Left eye exhibits no discharge. No scleral icterus.  Neck: Normal range of motion. Neck supple. No thyromegaly present.  Cardiovascular: Normal rate, regular rhythm, normal heart sounds and intact distal pulses.  Exam reveals no gallop and no friction rub.   No murmur heard. At 60 per minute  Pulmonary/Chest: Effort normal and breath sounds normal. No respiratory distress. He has no wheezes. He has no rales. He exhibits no tenderness.  Abdominal: Soft. Bowel sounds are normal. He exhibits no mass. There is no tenderness. There is no rebound and no guarding.  Genitourinary: Rectum normal and penis normal.  The prostate is slightly enlarged and there are no rectal masses or  lumps in the prostate. There is an external hemorrhoid which is not inflamed. There no hernias bilaterally. The testicles and external genitalia were within normal limits. There are no inguinal nodes.  Musculoskeletal: Normal range of motion. He exhibits no edema and no tenderness.  Lymphadenopathy:    He has no cervical adenopathy.  Neurological: He is alert and oriented to person, place, and time. He has normal reflexes. No cranial nerve deficit.  Skin: Skin is warm  and dry. No rash noted. No erythema. No pallor.  Psychiatric: He has a normal mood and affect. His behavior is normal. Judgment and thought content normal.    BP 127/79  Pulse 51  Temp(Src) 98.3 F (36.8 C) (Oral)  Ht 5' 8.25" (1.734 m)  Wt 178 lb (80.74 kg)  BMI 26.85 kg/m2       Assessment & Plan:  1. BPH (benign prostatic hyperplasia) - POCT CBC - POCT UA - Microscopic Only - POCT urinalysis dipstick - PSA, total and free - Testosterone,Free and Total - Urine culture  2. Elevated blood sugar - POCT CBC - POCT glycosylated hemoglobin (Hb A1C)  3. Essential hypertension, benign - POCT CBC - BMP8+EGFR - Hepatic function panel  4. hyperlipidemia - POCT CBC - NMR, lipoprofile  5. testosterone deficiency - POCT CBC - PSA, total and free - Testosterone,Free and Total  6. Vitamin D deficiency - Vit D  25 hydroxy (rtn osteoporosis monitoring)  7. Annual physical exam - POCT CBC - POCT glycosylated hemoglobin (Hb A1C) - POCT UA - Microscopic Only - POCT urinalysis dipstick - BMP8+EGFR - Hepatic function panel - NMR, lipoprofile - PSA, total and free - Testosterone,Free and Total - Vit D  25 hydroxy (rtn osteoporosis monitoring) - Urine culture  8. Other seasonal allergic rhinitis   Meds ordered this encounter  Medications  . candesartan (ATACAND) 16 MG tablet    Sig: TAKE 1 TABLET DAILY    Dispense:  90 tablet    Refill:  3  . TRILIPIX 135 MG capsule    Sig: TAKE 1 CAPSULE DAILY    Dispense:  90 capsule    Refill:  3   Patient Instructions  Continue current medications. Continue good therapeutic lifestyle changes which include good diet and exercise. Fall precautions discussed with patient. If an FOBT was given today- please return it to our front desk. If you are over 66 years old - you may need Prevnar 74 or the adult Pneumonia vaccine.  Flu Shots will be available at our office starting mid- September. Please call and schedule a FLU CLINIC  APPOINTMENT.   Continue to monitor blood pressures and blood sugars Although strength plenty of fluids to keep constipation at a minimum If problems with the hemorrhoids were snoring get worse call us back and we will further evaluate these at the time If you develop any increasing daytime fatigue, we will consider getting a sleep study. Keep using saline nose spray and during allergy season, he may want to use your Flonase or fluticasone more regularly. This is over-the-counter. Continue to stay active physically and keep your weight down Check with your insurance regarding the Prevnar vaccine and the Zostavax vaccine   Arrie Senate MD

## 2014-07-09 ENCOUNTER — Telehealth: Payer: Self-pay | Admitting: Family Medicine

## 2014-07-09 LAB — BMP8+EGFR
BUN/Creatinine Ratio: 18 (ref 9–20)
BUN: 21 mg/dL (ref 6–24)
CO2: 23 mmol/L (ref 18–29)
Calcium: 10.1 mg/dL (ref 8.7–10.2)
Chloride: 100 mmol/L (ref 97–108)
Creatinine, Ser: 1.15 mg/dL (ref 0.76–1.27)
GFR calc Af Amer: 81 mL/min/{1.73_m2} (ref >59)
GFR, EST NON AFRICAN AMERICAN: 70 mL/min/{1.73_m2} (ref >59)
GLUCOSE: 115 mg/dL — AB (ref 65–99)
Potassium: 4.9 mmol/L (ref 3.5–5.2)
Sodium: 141 mmol/L (ref 134–144)

## 2014-07-09 LAB — HEPATIC FUNCTION PANEL
ALT: 45 IU/L — ABNORMAL HIGH (ref 0–44)
AST: 36 IU/L (ref 0–40)
Albumin: 4.9 g/dL (ref 3.5–5.5)
Alkaline Phosphatase: 39 IU/L (ref 39–117)
BILIRUBIN DIRECT: 0.13 mg/dL (ref 0.00–0.40)
TOTAL PROTEIN: 7.1 g/dL (ref 6.0–8.5)
Total Bilirubin: 0.4 mg/dL (ref 0.0–1.2)

## 2014-07-09 LAB — NMR, LIPOPROFILE
CHOLESTEROL: 113 mg/dL (ref 100–199)
HDL Cholesterol by NMR: 51 mg/dL (ref >39)
HDL PARTICLE NUMBER: 38.7 umol/L (ref >=30.5)
LDL Particle Number: 696 nmol/L (ref <1000)
LDL SIZE: 19.9 nm (ref >20.5)
LDLC SERPL CALC-MCNC: 40 mg/dL (ref 0–99)
LP-IR SCORE: 43 (ref <=45)
Small LDL Particle Number: 475 nmol/L (ref <=527)
Triglycerides by NMR: 112 mg/dL (ref 0–149)

## 2014-07-09 LAB — PSA, TOTAL AND FREE
PSA, Free Pct: 38.9 %
PSA, Free: 0.7 ng/mL
PSA: 1.8 ng/mL (ref 0.0–4.0)

## 2014-07-09 LAB — TESTOSTERONE,FREE AND TOTAL
Testosterone, Free: 6.1 pg/mL — ABNORMAL LOW (ref 7.2–24.0)
Testosterone: 311 ng/dL — ABNORMAL LOW (ref 348–1197)

## 2014-07-09 LAB — VITAMIN D 25 HYDROXY (VIT D DEFICIENCY, FRACTURES): VIT D 25 HYDROXY: 58.9 ng/mL (ref 30.0–100.0)

## 2014-07-11 NOTE — Telephone Encounter (Signed)
Please change to fenofibrate 135 mg

## 2014-07-12 LAB — URINE CULTURE

## 2014-07-13 MED ORDER — FENOFIBRIC ACID 135 MG PO CPDR: 135.0000 mg | DELAYED_RELEASE_CAPSULE | Freq: Every day | ORAL | Status: DC

## 2014-07-14 ENCOUNTER — Other Ambulatory Visit: Payer: Self-pay | Admitting: Family Medicine

## 2014-07-14 ENCOUNTER — Telehealth: Payer: Self-pay | Admitting: Family Medicine

## 2014-07-14 MED ORDER — CIPROFLOXACIN HCL 500 MG PO TABS: 500.0000 mg | ORAL_TABLET | Freq: Two times a day (BID) | ORAL | Status: DC

## 2014-07-14 NOTE — Telephone Encounter (Signed)
Message copied by Waverly Ferrari on Wed Jul 14, 2014 11:24 AM ------      Message from: Chipper Herb      Created: Mon Jul 12, 2014  6:29 PM       The urinalysis was clear however a urine culture done did grow out Escherichia coli and an enterococcus species both sensitive to Cipro Floxin------ please call prescription in for Cipro 500 mg twice daily for 7 days ------

## 2014-07-14 NOTE — Telephone Encounter (Signed)
lmtcb

## 2014-07-14 NOTE — Telephone Encounter (Signed)
In lab results left message on both phone numbers

## 2014-07-14 NOTE — Telephone Encounter (Signed)
Please call Kailash at [phone] with lab results.

## 2014-07-14 NOTE — Telephone Encounter (Signed)
Message copied by Waverly Ferrari on Wed Jul 14, 2014 11:23 AM ------      Message from: Chipper Herb      Created: Fri Jul 09, 2014  3:16 PM       The blood sugar was elevated at 1:15. The creatinine, the most important kidney function test was within normal limits. The electrolytes including potassium were within normal limits.      One liver function test was slightly elevated, all of the other ones were normal       All cholesterol numbers by advanced lipid testing were excellent and at goal, continue current treatment and as aggressive therapeutic lifestyle changes as possible.      The PSA was low and within normal limits.      The free and direct testosterone and total testosterone were both low. The patient is having no symptoms in regard to this and at this time is not interested in any treatment.      The vitamin D level was good at 58.9. Continue current treatment ------

## 2014-07-30 ENCOUNTER — Other Ambulatory Visit: Payer: Self-pay | Admitting: Family Medicine

## 2014-07-30 ENCOUNTER — Telehealth: Payer: Self-pay | Admitting: Family Medicine

## 2014-07-30 MED ORDER — ATORVASTATIN CALCIUM 20 MG PO TABS: ORAL_TABLET | ORAL | Status: DC

## 2014-07-30 NOTE — Telephone Encounter (Signed)
done

## 2014-08-05 ENCOUNTER — Other Ambulatory Visit: Payer: Self-pay | Admitting: *Deleted

## 2014-08-05 MED ORDER — GLUCOSE BLOOD VI STRP: ORAL_STRIP | Status: DC

## 2014-12-07 ENCOUNTER — Other Ambulatory Visit: Payer: Self-pay | Admitting: *Deleted

## 2014-12-07 MED ORDER — ATORVASTATIN CALCIUM 20 MG PO TABS: ORAL_TABLET | ORAL | Status: DC

## 2015-01-06 ENCOUNTER — Ambulatory Visit: Admitting: Family Medicine

## 2015-01-25 ENCOUNTER — Ambulatory Visit (INDEPENDENT_AMBULATORY_CARE_PROVIDER_SITE_OTHER): Admitting: Physician Assistant

## 2015-01-25 ENCOUNTER — Encounter: Payer: Self-pay | Admitting: Physician Assistant

## 2015-01-25 VITALS — BP 131/86 | HR 90 | Temp 97.2°F | Ht 68.25 in | Wt 186.0 lb

## 2015-01-25 DIAGNOSIS — J018 Other acute sinusitis: Secondary | ICD-10-CM | POA: Diagnosis not present

## 2015-01-25 DIAGNOSIS — R6883 Chills (without fever): Secondary | ICD-10-CM

## 2015-01-25 LAB — POCT INFLUENZA A/B
Influenza A, POC: NEGATIVE
Influenza B, POC: NEGATIVE

## 2015-01-25 MED ORDER — AZITHROMYCIN 250 MG PO TABS: ORAL_TABLET | ORAL | Status: DC

## 2015-01-25 NOTE — Progress Notes (Signed)
   Subjective:    Patient ID: Terry [name], male    DOB: Feb 12, 1956, 59 y.o.   MRN: [id number]  HPI 59 y/o male presents with sneezing, nasal congestion, low grade fever, chills x 3 days. Has tried Dayquil and nyquil with relief but started feeling worse yesterday.     Review of Systems  Constitutional: Positive for fever (low grade), chills, appetite change (decreased) and fatigue.  HENT: Positive for congestion (nasal ) and sneezing. Negative for ear pain, postnasal drip, rhinorrhea, sinus pressure and sore throat.   Eyes: Negative.   Respiratory: Positive for cough (nonproductive, dry). Negative for shortness of breath and wheezing.   Cardiovascular: Negative.   Gastrointestinal: Negative.        Objective:   Physical Exam  Constitutional: He appears well-developed and well-nourished. No distress.  HENT:  Right Ear: External ear normal.  Left Ear: External ear normal.  Nose: Nose normal.  Mouth/Throat: Oropharynx is clear and moist. No oropharyngeal exudate.  Eyes: Right eye exhibits no discharge. Left eye exhibits no discharge.  Cardiovascular: Normal rate, regular rhythm and normal heart sounds.  Exam reveals no gallop and no friction rub.   No murmur heard. Pulmonary/Chest: Effort normal and breath sounds normal. No respiratory distress. He has no wheezes. He has no rales.  Skin: He is not diaphoretic.  Nursing note and vitals reviewed.         Assessment & Plan:  1. Sinusitis: Azithromycin 250mg  as directed.  2. Allergic Rhinitis: Flonase as directed.   If patient is still having symptoms after 1 week, rtc for f/u

## 2015-01-25 NOTE — Patient Instructions (Signed)
Drink plenty of fluids, rest, take antibiotic as prescribed. RTC at the end of next week if no improvement.

## 2015-01-27 IMAGING — CR DG CHEST 2V
2 series · 2 of 2 positions shown · non-contrast
Comparison: October 26, 2008.

CLINICAL DATA: Benign essential hypertension.

EXAM:
CHEST  2 VIEW

[view not recorded (1 of 2)]
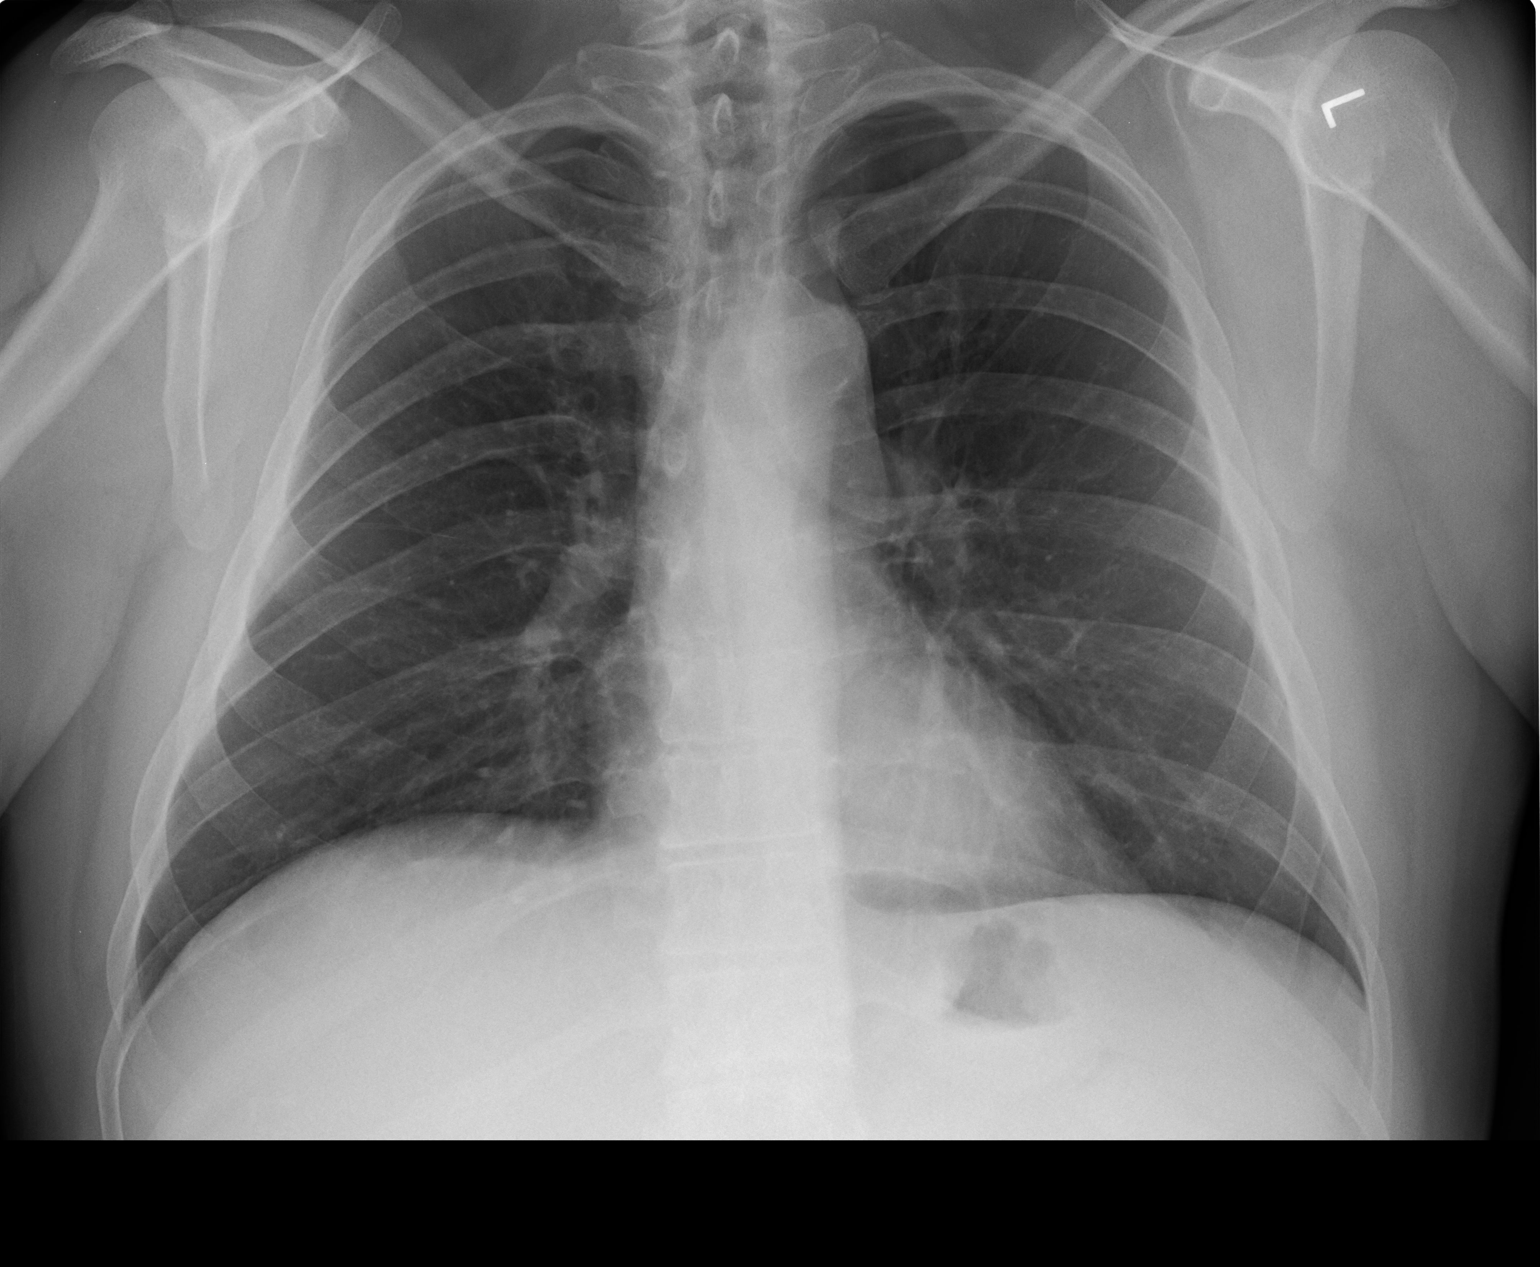

[view not recorded (2 of 2)]
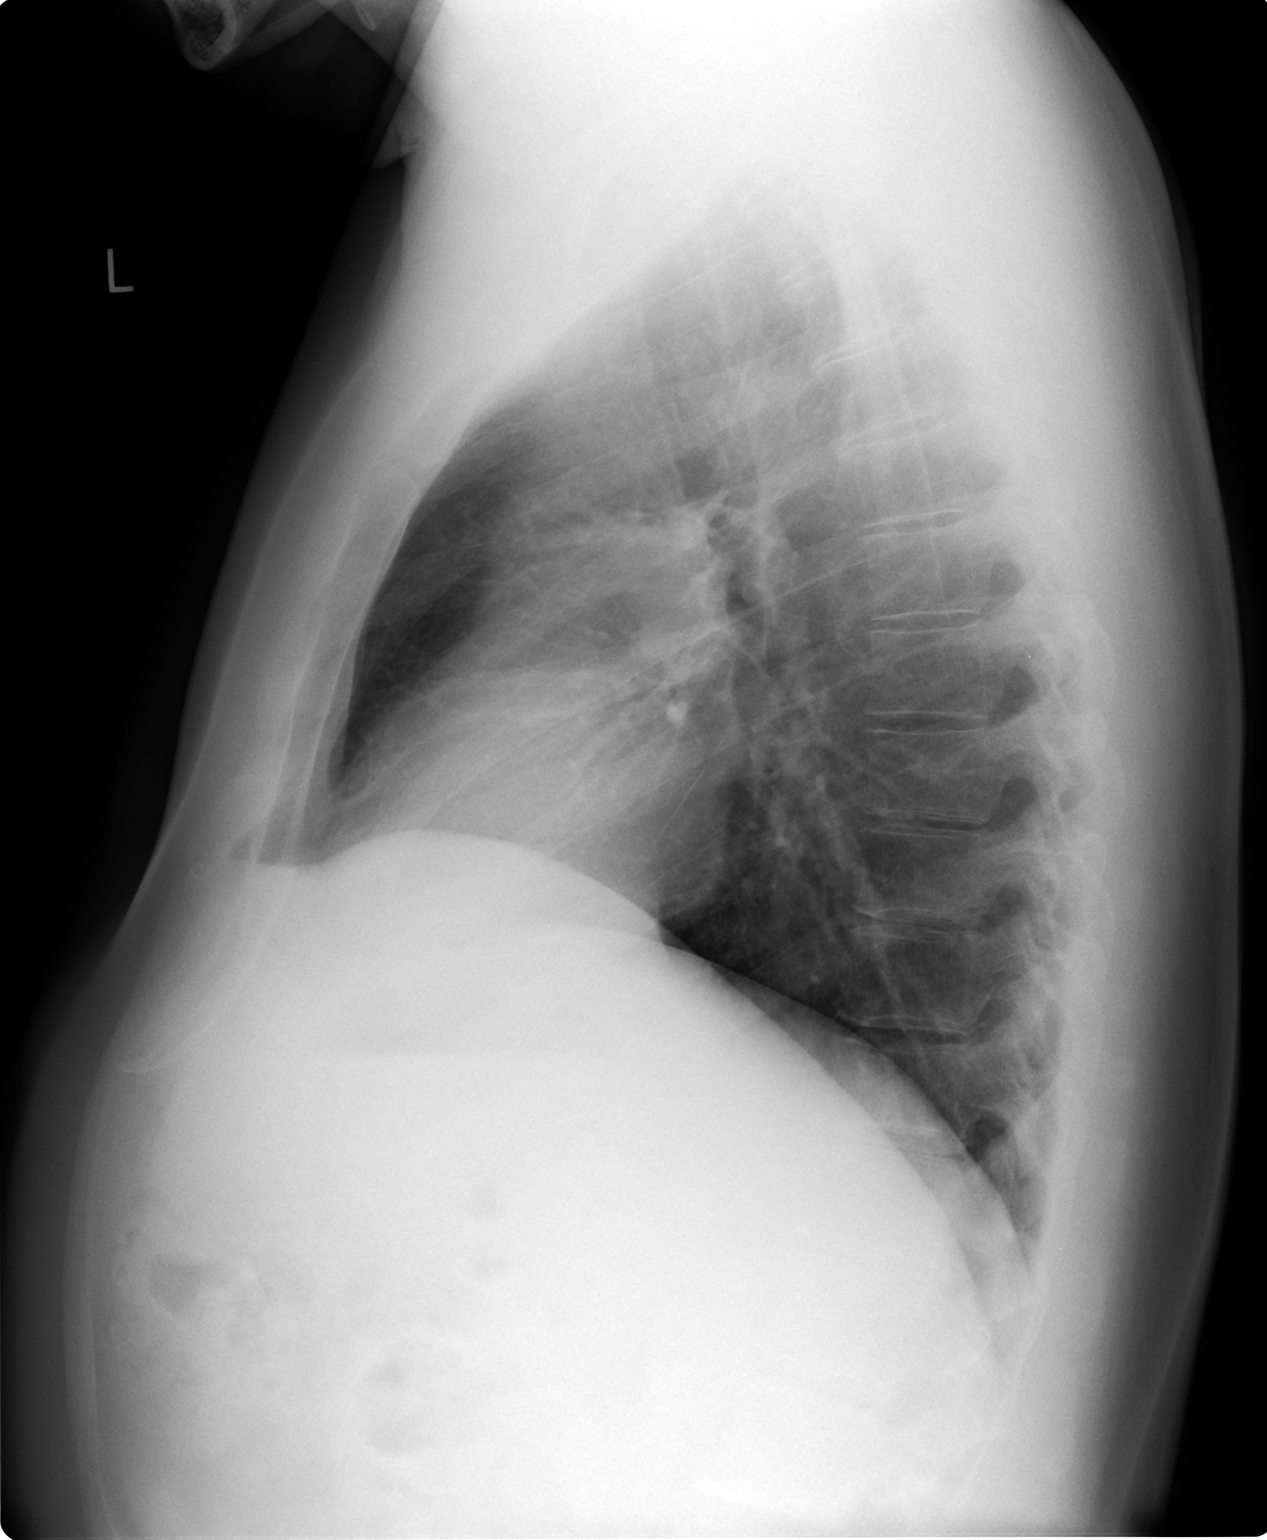

[2 of 2 positions shown; findings below may reference images not displayed]

FINDINGS: The heart size and mediastinal contours are within normal limits.
Both lungs are clear. The visualized skeletal structures are
unremarkable.
IMPRESSION: No active cardiopulmonary disease.

## 2015-02-09 ENCOUNTER — Ambulatory Visit: Admitting: Family Medicine

## 2015-02-11 ENCOUNTER — Ambulatory Visit: Admitting: Family Medicine

## 2015-03-04 ENCOUNTER — Encounter: Payer: Self-pay | Admitting: Family Medicine

## 2015-03-04 ENCOUNTER — Ambulatory Visit (INDEPENDENT_AMBULATORY_CARE_PROVIDER_SITE_OTHER): Admitting: Family Medicine

## 2015-03-04 VITALS — BP 139/80 | HR 46 | Temp 97.5°F | Ht 68.25 in | Wt 184.0 lb

## 2015-03-04 DIAGNOSIS — E291 Testicular hypofunction: Secondary | ICD-10-CM | POA: Diagnosis not present

## 2015-03-04 DIAGNOSIS — R0683 Snoring: Secondary | ICD-10-CM

## 2015-03-04 DIAGNOSIS — I10 Essential (primary) hypertension: Secondary | ICD-10-CM | POA: Diagnosis not present

## 2015-03-04 DIAGNOSIS — R5383 Other fatigue: Secondary | ICD-10-CM | POA: Diagnosis not present

## 2015-03-04 DIAGNOSIS — E349 Endocrine disorder, unspecified: Secondary | ICD-10-CM

## 2015-03-04 DIAGNOSIS — R7309 Other abnormal glucose: Secondary | ICD-10-CM

## 2015-03-04 DIAGNOSIS — E559 Vitamin D deficiency, unspecified: Secondary | ICD-10-CM

## 2015-03-04 DIAGNOSIS — R739 Hyperglycemia, unspecified: Secondary | ICD-10-CM

## 2015-03-04 LAB — POCT CBC
GRANULOCYTE PERCENT: 52.1 % (ref 37–80)
HCT, POC: 41.4 % — AB (ref 43.5–53.7)
Hemoglobin: 13.2 g/dL — AB (ref 14.1–18.1)
Lymph, poc: 2.5 (ref 0.6–3.4)
MCH, POC: 28.5 pg (ref 27–31.2)
MCHC: 31.8 g/dL (ref 31.8–35.4)
MCV: 89.5 fL (ref 80–97)
MPV: 8.8 fL (ref 0–99.8)
POC GRANULOCYTE: 2.9 (ref 2–6.9)
POC LYMPH PERCENT: 45.1 %L (ref 10–50)
Platelet Count, POC: 290 10*3/uL (ref 142–424)
RBC: 4.62 M/uL — AB (ref 4.69–6.13)
RDW, POC: 13.5 %
WBC: 5.6 10*3/uL (ref 4.6–10.2)

## 2015-03-04 NOTE — Progress Notes (Signed)
Subjective:    Patient ID: Terry [name], male    DOB: 08/11/1956, 59 y.o.   MRN: [id number]  HPI Pt here for follow up and management of chronic medical problems which includes hyperlipidemia. He is taking medications regularly. The patient has some questions regarding his testosterone deficiency for which she is not taking any medicine in a good while. His also got some complaints concerning snoring. He has a bite of some sort on the scrotum and he would like for [place] to look at this. He will get lab work today. He does not need any refills. The patient brings in blood sugars for review as well as blood pressures for review the blood pressures appear to be running anywhere from the 120s to the 150s over the 80s to 90 range. The blood sugars are running in the 120-140 range. They seem to be higher in the morning and better during the day. The patient's weight has been stable. He brings all these readings in for review and I will be scanned into the record. The patient denies chest pain shortness of breath trouble swallowing or any GI symptoms. He is voiding okay. He is concerned about his blood pressure is running slightly elevated and has trouble focusing on getting his weight down with exercise and diet.       Patient Active Problem List   Diagnosis Date Noted  . Esophageal reflux   . Elevated blood sugar   . BPH (benign prostatic hyperplasia)   . Unspecified hemorrhoids without mention of complication   . testosterone deficiency   . hyperlipidemia   . Essential hypertension, benign   . Unspecified glaucoma    Outpatient Encounter Prescriptions as of 03/04/2015  Medication Sig  . aspirin 81 MG EC tablet Take 81 mg by mouth daily.    Marland Kitchen atorvastatin (LIPITOR) 20 MG tablet TAKE 1 TABLET DAILY AS DIRECTED  . azithromycin (ZITHROMAX) 250 MG tablet Take 2 tablets on day 1, 1 tablet on day 2-5  . bimatoprost (LUMIGAN) 0.03 % ophthalmic solution 1 drop at bedtime.  . candesartan (ATACAND) 16  MG tablet TAKE 1 TABLET DAILY  . Cholecalciferol (VITAMIN D3) 2000 UNITS TABS Take 2 tablets by mouth daily.   . Choline Fenofibrate (FENOFIBRIC ACID) 135 MG CPDR Take 135 mg by mouth daily.  Marland Kitchen glucose blood (FREESTYLE LITE) test strip Test blood sugar bid Dx 250.00  . LOVAZA 1 G capsule TAKE 2 CAPSULES (2 GRAMS) TWICE A DAY  . Magnesium 500 MG CAPS Take 1 capsule by mouth daily.  . timolol (BETIMOL) 0.25 % ophthalmic solution 1-2 drops 2 (two) times daily.     Review of Systems  Constitutional: Negative.   HENT: Negative.   Eyes: Negative.   Respiratory: Negative.        Snoring  Cardiovascular: Negative.   Gastrointestinal: Negative.   Endocrine: Negative.   Genitourinary: Negative.        Bite/ bump- scrotum  Musculoskeletal: Negative.   Skin: Negative.   Allergic/Immunologic: Negative.   Neurological: Negative.   Hematological: Negative.   Psychiatric/Behavioral: Negative.        Objective:   Physical Exam  Constitutional: He is oriented to person, place, and time. He appears well-developed and well-nourished.  HENT:  Head: Normocephalic and atraumatic.  Right Ear: External ear normal.  Left Ear: External ear normal.  Mouth/Throat: Oropharynx is clear and moist. No oropharyngeal exudate.  Nasal congestion bilaterally  Eyes: Conjunctivae and EOM are normal. Pupils are equal, round, and  reactive to light. Right eye exhibits no discharge. Left eye exhibits no discharge. No scleral icterus.  Neck: Normal range of motion. Neck supple. No thyromegaly present.  No thyromegaly anterior cervical nodes or carotid bruits  Cardiovascular: Normal rate, regular rhythm, normal heart sounds and intact distal pulses.   No murmur heard. At 60/m  Pulmonary/Chest: Effort normal and breath sounds normal. No respiratory distress. He has no wheezes. He has no rales. He exhibits no tenderness.  Clear anteriorly and posteriorly  Abdominal: Soft. Bowel sounds are normal. He exhibits no mass.  There is no tenderness. There is no rebound and no guarding.  Musculoskeletal: Normal range of motion. He exhibits no edema or tenderness.  Lymphadenopathy:    He has no cervical adenopathy.  Neurological: He is alert and oriented to person, place, and time. He has normal reflexes. No cranial nerve deficit.  Skin: Skin is warm and dry. No rash noted.  There was a superficial cystic like skin lesion on the left scrotal area and this appears benign and we will continue to monitor this.  Psychiatric: He has a normal mood and affect. His behavior is normal. Judgment and thought content normal.  Nursing note and vitals reviewed.  BP 146/83 mmHg  Pulse 46  Temp(Src) 97.5 F (36.4 C) (Oral)  Ht 5' 8.25" (1.734 m)  Wt 184 lb (83.462 kg)  BMI 27.76 kg/m2        Assessment & Plan:  1. Elevated blood sugar -The patient should begin a regular exercise regimen and continue to watch his blood sugars as closely as possible. - POCT CBC  2. Vitamin D deficiency -Continue current treatment pending results of lab work - POCT CBC - Vit D  25 hydroxy (rtn osteoporosis monitoring)  3. Essential hypertension, benign -The patient's blood pressure is borderline and I got 139/80 on recheck. He should continue to watch his sodium intake and try to get more exercise and lose a little bit of weight and this will be a tremendous help in getting his blood pressure under better control. - POCT CBC - BMP8+EGFR - Hepatic function panel - NMR, lipoprofile  4. Testosterone deficiency -Because the patient is experiencing a little more fatigued than usual he was like to have the testosterone levels rechecked. He is not taking any testosterone replacement at the present time and may need to restart this. - POCT CBC - Testosterone,Free and Total  5. Other fatigue -Because of snoring and the possibility of sleep apnea we will do a sleep apnea evaluation.  6. Snoring -Sleep apnea evaluation is going to be  planned.  Meds ordered this encounter  Medications  . timolol (BETIMOL) 0.25 % ophthalmic solution    Sig: 1-2 drops 2 (two) times daily.  . bimatoprost (LUMIGAN) 0.03 % ophthalmic solution    Sig: 1 drop at bedtime.  . Magnesium 500 MG CAPS    Sig: Take 1 capsule by mouth daily.   Patient Instructions  Continue current medications. Continue good therapeutic lifestyle changes which include good diet and exercise. Fall precautions discussed with patient. If an FOBT was given today- please return it to our front desk. If you are over 27 years old - you may need Prevnar 77 or the adult Pneumonia vaccine.  Flu Shots are still available at our office. If you still haven't had one please call to set up a nurse visit to get one.   After your visit with [place] today you will receive a survey in the mail or  online from [employer] regarding your care with [place]. Please take a moment to fill this out. Your feedback is very important to [place] as you can help [place] better understand your patient needs as well as improve your experience and satisfaction. WE CARE ABOUT YOU!!!   The patient will make a special effort to try to get more exercise and, watch his sodium intake, drink more fluids and eat a healthier diet. We will consider starting him back on testosterone therapy once his lab work is returned He should return his blood sugar readings and blood pressure readings for review in 4 weeks, he should keep the hard copy and let us make a copy of that for the review. If he needs help with his dietary regimen he should contact our clinical pharmacist for more dietary education. He should also consider a weight watchers group to join. We will also make arrangements to have a sleep apnea evaluation completed. The patient should return his FOBT   Arrie Senate MD

## 2015-03-04 NOTE — Patient Instructions (Addendum)
Continue current medications. Continue good therapeutic lifestyle changes which include good diet and exercise. Fall precautions discussed with patient. If an FOBT was given today- please return it to our front desk. If you are over 59 years old - you may need Prevnar 54 or the adult Pneumonia vaccine.  Flu Shots are still available at our office. If you still haven't had one please call to set up a nurse visit to get one.   After your visit with [place] today you will receive a survey in the mail or online from [employer] regarding your care with [place]. Please take a moment to fill this out. Your feedback is very important to [place] as you can help [place] better understand your patient needs as well as improve your experience and satisfaction. WE CARE ABOUT YOU!!!   The patient will make a special effort to try to get more exercise and, watch his sodium intake, drink more fluids and eat a healthier diet. We will consider starting him back on testosterone therapy once his lab work is returned He should return his blood sugar readings and blood pressure readings for review in 4 weeks, he should keep the hard copy and let us make a copy of that for the review. If he needs help with his dietary regimen he should contact our clinical pharmacist for more dietary education. He should also consider a weight watchers group to join. We will also make arrangements to have a sleep apnea evaluation completed. The patient should return his FOBT

## 2015-03-05 LAB — TESTOSTERONE,FREE AND TOTAL
TESTOSTERONE: 233 ng/dL — AB (ref 348–1197)
Testosterone, Free: 6.3 pg/mL — ABNORMAL LOW (ref 7.2–24.0)

## 2015-03-05 LAB — HEPATIC FUNCTION PANEL
ALBUMIN: 5 g/dL (ref 3.5–5.5)
ALK PHOS: 39 IU/L (ref 39–117)
ALT: 44 IU/L (ref 0–44)
AST: 34 IU/L (ref 0–40)
Bilirubin Total: 0.4 mg/dL (ref 0.0–1.2)
Bilirubin, Direct: 0.14 mg/dL (ref 0.00–0.40)
Total Protein: 7.2 g/dL (ref 6.0–8.5)

## 2015-03-05 LAB — NMR, LIPOPROFILE
Cholesterol: 124 mg/dL (ref 100–199)
HDL Cholesterol by NMR: 58 mg/dL (ref >39)
HDL Particle Number: 39.8 umol/L (ref >=30.5)
LDL Particle Number: 575 nmol/L (ref <1000)
LDL SIZE: 20.6 nm (ref >20.5)
LDL-C: 46 mg/dL (ref 0–99)
LP-IR Score: 47 — ABNORMAL HIGH (ref <=45)
SMALL LDL PARTICLE NUMBER: 281 nmol/L (ref <=527)
Triglycerides by NMR: 102 mg/dL (ref 0–149)

## 2015-03-05 LAB — BMP8+EGFR
BUN / CREAT RATIO: 17 (ref 9–20)
BUN: 17 mg/dL (ref 6–24)
CHLORIDE: 104 mmol/L (ref 97–108)
CO2: 21 mmol/L (ref 18–29)
Calcium: 10.2 mg/dL (ref 8.7–10.2)
Creatinine, Ser: 1.03 mg/dL (ref 0.76–1.27)
GFR calc Af Amer: 92 mL/min/{1.73_m2} (ref >59)
GFR calc non Af Amer: 80 mL/min/{1.73_m2} (ref >59)
Glucose: 121 mg/dL — ABNORMAL HIGH (ref 65–99)
Potassium: 5.2 mmol/L (ref 3.5–5.2)
Sodium: 141 mmol/L (ref 134–144)

## 2015-03-05 LAB — VITAMIN D 25 HYDROXY (VIT D DEFICIENCY, FRACTURES): VIT D 25 HYDROXY: 50.3 ng/mL (ref 30.0–100.0)

## 2015-03-09 ENCOUNTER — Other Ambulatory Visit: Payer: Self-pay | Admitting: *Deleted

## 2015-03-09 ENCOUNTER — Telehealth: Payer: Self-pay | Admitting: *Deleted

## 2015-03-09 ENCOUNTER — Telehealth: Payer: Self-pay | Admitting: Family Medicine

## 2015-03-09 LAB — POCT GLYCOSYLATED HEMOGLOBIN (HGB A1C): Hemoglobin A1C: 6.3

## 2015-03-09 MED ORDER — METFORMIN HCL 500 MG PO TABS: 500.0000 mg | ORAL_TABLET | Freq: Every day | ORAL | Status: DC

## 2015-03-09 MED ORDER — TESTOSTERONE 20.25 MG/ACT (1.62%) TD GEL: 4.0000 "application " | Freq: Every day | TRANSDERMAL | Status: DC

## 2015-03-09 NOTE — Telephone Encounter (Signed)
-----   Message from Chipper Herb, MD sent at 03/07/2015 11:40 AM EDT ----- Please call the patient with his results

## 2015-03-09 NOTE — Addendum Note (Signed)
Addended by: Thana Ates on: 03/09/2015 02:38 PM   Modules accepted: Orders

## 2015-03-09 NOTE — Addendum Note (Signed)
Addended by: Zannie Cove on: 03/09/2015 04:18 PM   Modules accepted: Orders

## 2015-03-09 NOTE — Telephone Encounter (Signed)
Patient aware of results.

## 2015-03-10 ENCOUNTER — Other Ambulatory Visit

## 2015-03-10 DIAGNOSIS — Z1212 Encounter for screening for malignant neoplasm of rectum: Secondary | ICD-10-CM

## 2015-03-10 NOTE — Progress Notes (Signed)
Lab only 

## 2015-03-13 LAB — FECAL OCCULT BLOOD, IMMUNOCHEMICAL: Fecal Occult Bld: NEGATIVE

## 2015-05-17 ENCOUNTER — Other Ambulatory Visit: Payer: Self-pay | Admitting: Family Medicine

## 2015-05-30 ENCOUNTER — Encounter (INDEPENDENT_AMBULATORY_CARE_PROVIDER_SITE_OTHER): Payer: Self-pay

## 2015-05-30 ENCOUNTER — Ambulatory Visit (INDEPENDENT_AMBULATORY_CARE_PROVIDER_SITE_OTHER): Admitting: Internal Medicine

## 2015-05-30 ENCOUNTER — Encounter: Payer: Self-pay | Admitting: Internal Medicine

## 2015-05-30 VITALS — BP 122/78 | HR 54 | Ht 69.0 in | Wt 184.0 lb

## 2015-05-30 DIAGNOSIS — G4733 Obstructive sleep apnea (adult) (pediatric): Secondary | ICD-10-CM

## 2015-05-30 NOTE — Patient Instructions (Signed)
Order- Schedule unattended home sleep study   Dx OSA  We will try to schedule you back a couple of weeks after the sleep study to go over results\\  Please call as needed

## 2015-05-30 NOTE — Progress Notes (Signed)
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 He has tried various over-the-counter products and a nonprescription oral appliance. Not usually sleepy during the daytime but nods off watching TV in the evening. Sleep pattern is adequate on review. No ENT surgery. History of seasonal allergic rhinitis between April and June treated OTC. Some perennial sinus congestion. Hypertension. He denies other heart or lung disease.  Prior to Admission medications   Medication Sig Start Date End Date Taking? Authorizing Provider  aspirin 81 MG EC tablet Take 81 mg by mouth daily.     Yes Historical Provider, MD  atorvastatin (LIPITOR) 20 MG tablet TAKE 1 TABLET DAILY AS DIRECTED 12/07/14  Yes Chipper Herb, MD  bimatoprost (LUMIGAN) 0.03 % ophthalmic solution 1 drop at bedtime.   Yes Historical Provider, MD  Cholecalciferol (VITAMIN D3) 2000 UNITS TABS Take 2 tablets by mouth daily.    Yes Historical Provider, MD  Choline Fenofibrate (FENOFIBRIC ACID) 135 MG CPDR Take 135 mg by mouth daily. 07/13/14  Yes Chipper Herb, MD  glucose blood (FREESTYLE LITE) test strip Test blood sugar bid Dx 250.00 08/05/14  Yes Chipper Herb, MD  LOVAZA 1 G capsule TAKE 2 CAPSULES (2 GRAMS) TWICE A DAY   Yes Chipper Herb, MD  Magnesium 500 MG CAPS Take 1 capsule by mouth daily.   Yes Historical Provider, MD  metFORMIN (GLUCOPHAGE) 500 MG tablet Take 1 tablet (500 mg total) by mouth daily with breakfast. 03/09/15  Yes Chipper Herb, MD  Testosterone (ANDROGEL PUMP) 20.25 MG/ACT (1.62%) GEL Place 4 application onto the skin daily. 03/09/15  Yes Chipper Herb, MD  timolol (BETIMOL) 0.25 % ophthalmic solution 1-2 drops 2 (two) times daily.   Yes Historical Provider, MD  valsartan (DIOVAN) 80 MG tablet Take 1 tablet (80 mg total) by mouth daily. 06/03/15   Chipper Herb, MD   Past Medical  History  Diagnosis Date  . Esophageal reflux   . Elevated blood sugar   . Hyperplasia of prostate   . Unspecified hemorrhoids without mention of complication   . Other testicular hypofunction   . Other and unspecified hyperlipidemia   . Essential hypertension, benign   . Unspecified glaucoma    Past Surgical History  Procedure Laterality Date  . Lipoma excision Right 2000    shoulder   Family History  Problem Relation Age of Onset  . Heart disease Mother   . Stroke Father    History   Social History  . Marital Status: Married    Spouse Name: N/A  . Number of Children: N/A  . Years of Education: N/A   Occupational History  . Not on file.   Social History Main Topics  . Smoking status: Never Smoker   . Smokeless tobacco: Not on file  . Alcohol Use: Yes     Comment: VERY RARE  . Drug Use: No  . Sexual Activity: Not on file   Other Topics Concern  . Not on file   Social History Narrative   ROS-see HPI   Negative unless "+" Constitutional:    weight loss, night sweats, fevers, chills, fatigue, lassitude. HEENT:    headaches, difficulty swallowing, tooth/dental problems, sore throat,       sneezing, itching, ear ache, +nasal congestion, post nasal drip, snoring CV:    chest pain, orthopnea, PND, swelling  in lower extremities, anasarca,                                                     dizziness, palpitations Resp:   shortness of breath with exertion or at rest.                productive cough,   non-productive cough, coughing up of blood.              change in color of mucus.  wheezing.   Skin:    rash or lesions. GI:  No-   heartburn, indigestion, abdominal pain, nausea, vomiting, diarrhea,                 change in bowel habits, loss of appetite GU: dysuria, change in color of urine, no urgency or frequency.   flank pain. MS:   joint pain, stiffness, decreased range of motion, back pain. Neuro-     nothing unusual Psych:  change in mood or affect.   depression or anxiety.   memory loss.  OBJ- Physical Exam General- Alert, Oriented, Affect-appropriate, Distress- none acute, medium build Skin- rash-none, lesions- none, excoriation- none Lymphadenopathy- none Head- atraumatic            Eyes- Gross vision intact, PERRLA, conjunctivae and secretions clear            Ears- Hearing, canals-normal            Nose- Clear, Septal dev+, mucus, polyps, erosion, perforation             Throat- Mallampati IV , mucosa clear , drainage- none, tonsils- atrophic, own teeth Neck- flexible , trachea midline, no stridor , thyroid nl, carotid no bruit Chest - symmetrical excursion , unlabored           Heart/CV- RRR , no murmur , no gallop  , no rub, nl s1 s2                           - JVD- none , edema- none, stasis changes- none, varices- none           Lung- clear to P&A, wheeze- none, cough- none , dullness-none, rub- none           Chest wall-  Abd-  Br/ Gen/ Rectal- Not done, not indicated Extrem- cyanosis- none, clubbing, none, atrophy- none, strength- nl Neuro- grossly intact to observation

## 2015-06-03 ENCOUNTER — Other Ambulatory Visit: Payer: Self-pay | Admitting: *Deleted

## 2015-06-03 MED ORDER — VALSARTAN 80 MG PO TABS: 80.0000 mg | ORAL_TABLET | Freq: Every day | ORAL | Status: DC

## 2015-06-03 NOTE — Telephone Encounter (Signed)
Ins wouldn't cover Atacand. Switched to Diovan per Dr Laurance Flatten and faxed back to [facility].

## 2015-06-05 DIAGNOSIS — G4733 Obstructive sleep apnea (adult) (pediatric): Secondary | ICD-10-CM | POA: Insufficient documentation

## 2015-06-05 NOTE — Assessment & Plan Note (Signed)
Based on physical exam and history, obstructive sleep apnea is likely. We discussed distinction between simple snoring and obstructive sleep apnea and reviewed basic good sleep hygiene. Plan-schedule polysomnogram

## 2015-06-23 ENCOUNTER — Other Ambulatory Visit: Payer: Self-pay | Admitting: Family Medicine

## 2015-06-27 DIAGNOSIS — G4733 Obstructive sleep apnea (adult) (pediatric): Secondary | ICD-10-CM | POA: Diagnosis not present

## 2015-07-14 ENCOUNTER — Telehealth: Payer: Self-pay | Admitting: Internal Medicine

## 2015-07-14 DIAGNOSIS — G4733 Obstructive sleep apnea (adult) (pediatric): Secondary | ICD-10-CM

## 2015-07-14 NOTE — Telephone Encounter (Signed)
Left detailed message advising patient that we do not have the results in the chart, I will check on the results and call him back with results.  Dr. Annamaria Boots, please advise if you have received patient's Home Sleep Test results.

## 2015-07-15 ENCOUNTER — Other Ambulatory Visit: Payer: Self-pay | Admitting: *Deleted

## 2015-07-15 DIAGNOSIS — G4733 Obstructive sleep apnea (adult) (pediatric): Secondary | ICD-10-CM | POA: Diagnosis not present

## 2015-07-15 NOTE — Telephone Encounter (Signed)
Left message for pt to call back  °

## 2015-07-15 NOTE — Telephone Encounter (Signed)
The sleep study showed moderate obstructive sleep apnea. Usually the best treatment for this is to wear a CPAP air pressure mask while sleeping. Suggest order New DME, new CPAP, auto 5-20, mask of choice, humidifier, supplies   For dx OSA  We would need to see him back  31-90 days after getting CPAP, per insurance regs.

## 2015-07-18 NOTE — Telephone Encounter (Signed)
Called pt and spoke regarding results. He verbalized understanding and order placed. Pt will call to schedule appt once he is set up on CPAP. Nothing further needed

## 2015-07-18 NOTE — Telephone Encounter (Signed)
Patient returned call and can be reached at [phone]

## 2015-07-29 ENCOUNTER — Other Ambulatory Visit: Payer: Self-pay | Admitting: Family Medicine

## 2015-09-16 ENCOUNTER — Encounter: Payer: Self-pay | Admitting: Internal Medicine

## 2015-09-19 ENCOUNTER — Ambulatory Visit: Admitting: Internal Medicine

## 2015-09-20 ENCOUNTER — Other Ambulatory Visit: Payer: Self-pay | Admitting: Family Medicine

## 2015-10-07 ENCOUNTER — Other Ambulatory Visit: Payer: Self-pay | Admitting: Family Medicine

## 2015-10-07 NOTE — Telephone Encounter (Signed)
Please refill this 

## 2015-10-07 NOTE — Telephone Encounter (Signed)
This is okay to refill plus this patient needs an appointment for follow-up. He needs to be seen at least every 6 months and remind him of this.

## 2015-10-07 NOTE — Telephone Encounter (Signed)
Last seen 03/04/15  DWM  Requesting 90 day supply

## 2015-10-11 ENCOUNTER — Other Ambulatory Visit: Payer: Self-pay | Admitting: *Deleted

## 2015-10-11 MED ORDER — FENOFIBRIC ACID 135 MG PO CPDR: 135.0000 mg | DELAYED_RELEASE_CAPSULE | Freq: Every day | ORAL | Status: DC

## 2015-10-25 ENCOUNTER — Other Ambulatory Visit: Payer: Self-pay | Admitting: Family Medicine

## 2015-10-25 NOTE — Telephone Encounter (Signed)
Last seen and last lipid 03/04/15  DWM

## 2015-11-02 ENCOUNTER — Ambulatory Visit: Admitting: Internal Medicine

## 2015-11-02 ENCOUNTER — Encounter: Payer: Self-pay | Admitting: Internal Medicine

## 2015-11-02 VITALS — BP 130/76 | HR 65 | Ht 69.0 in | Wt 184.0 lb

## 2015-11-02 DIAGNOSIS — G4733 Obstructive sleep apnea (adult) (pediatric): Secondary | ICD-10-CM

## 2015-11-02 NOTE — Progress Notes (Signed)
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 He has tried various over-the-counter products and a nonprescription oral appliance. Not usually sleepy during the daytime but nods off watching TV in the evening. Sleep pattern is adequate on review. No ENT surgery. History of seasonal allergic rhinitis between April and June treated OTC. Some perennial sinus congestion. Hypertension. He denies other heart or lung disease.  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 HEENT:    headaches, difficulty swallowing, tooth/dental problems, sore throat,       sneezing, itching, ear ache, +nasal congestion, post nasal drip, snoring CV:    chest pain, orthopnea, PND, swelling in lower extremities, anasarca,                                                     dizziness, palpitations Resp:   shortness of breath with exertion or at rest.                productive cough,   non-productive cough, coughing up of blood.              change in color of mucus.  wheezing.   Skin:    rash or lesions. GI:  No-   heartburn, indigestion, abdominal pain, nausea, vomiting, diarrhea,                 change in bowel habits, loss of appetite GU: dysuria, change in color of urine, no urgency or frequency.   flank pain. MS:   joint pain, stiffness, decreased range of motion, back pain. Neuro-     nothing unusual Psych:  change in mood or affect.  depression or anxiety.   memory loss.  OBJ- Physical Exam General- Alert, Oriented, Affect-appropriate, Distress- none acute, medium build Skin- rash-none, lesions- none, excoriation-  none Lymphadenopathy- none Head- atraumatic            Eyes- Gross vision intact, PERRLA, conjunctivae and secretions clear            Ears- Hearing, canals-normal            Nose- Clear, Septal dev+, mucus, polyps, erosion, perforation             Throat- Mallampati IV , mucosa clear , drainage- none, tonsils- atrophic, own teeth Neck- flexible , trachea midline, no stridor , thyroid nl, carotid no bruit Chest - symmetrical excursion , unlabored           Heart/CV- RRR , no murmur , no gallop  , no rub, nl s1 s2                           - JVD- none , edema- none, stasis changes- none, varices- none           Lung- clear to P&A, wheeze- none, cough- none , dullness-none, rub- none           Chest wall-  Abd-  Br/ Gen/ Rectal- Not done, not indicated Extrem- cyanosis- none, clubbing, none, atrophy- none, strength- nl Neuro- grossly intact to observation

## 2015-11-02 NOTE — Patient Instructions (Signed)
Order- [facility]- change CPAP auto to 5-15 cwp       Dx OSA  Please call as needed

## 2015-11-04 ENCOUNTER — Other Ambulatory Visit: Payer: Self-pay | Admitting: *Deleted

## 2015-11-04 MED ORDER — OMEGA-3-ACID ETHYL ESTERS 1 G PO CAPS: ORAL_CAPSULE | ORAL | Status: DC

## 2015-11-08 ENCOUNTER — Other Ambulatory Visit: Payer: Self-pay | Admitting: *Deleted

## 2015-11-08 MED ORDER — TESTOSTERONE 50 MG/5GM (1%) TD GEL: 5.0000 g | Freq: Every day | TRANSDERMAL | Status: DC

## 2015-11-08 NOTE — Telephone Encounter (Signed)
PER tbe - dwm  RX READY AND PT AWARE

## 2015-11-15 ENCOUNTER — Telehealth: Payer: Self-pay | Admitting: Family Medicine

## 2015-11-16 NOTE — Telephone Encounter (Signed)
Order given verbally per Dr. Laurance Flatten, it was sent locally and he wanted it mail order

## 2015-12-18 ENCOUNTER — Other Ambulatory Visit: Payer: Self-pay | Admitting: Family Medicine

## 2015-12-20 ENCOUNTER — Other Ambulatory Visit: Payer: Self-pay | Admitting: *Deleted

## 2015-12-20 MED ORDER — ATORVASTATIN CALCIUM 20 MG PO TABS: ORAL_TABLET | ORAL | Status: DC

## 2016-01-03 ENCOUNTER — Other Ambulatory Visit: Payer: Self-pay | Admitting: Family Medicine

## 2016-01-03 NOTE — Telephone Encounter (Signed)
Last seen 03/04/15  DWM

## 2016-02-08 ENCOUNTER — Other Ambulatory Visit: Payer: Self-pay | Admitting: Family Medicine

## 2016-02-08 NOTE — Telephone Encounter (Signed)
Last seen 03/04/15 DWM

## 2016-02-27 ENCOUNTER — Other Ambulatory Visit: Payer: Self-pay | Admitting: Family Medicine

## 2016-03-07 ENCOUNTER — Encounter: Payer: Self-pay | Admitting: Family Medicine

## 2016-03-07 ENCOUNTER — Encounter (INDEPENDENT_AMBULATORY_CARE_PROVIDER_SITE_OTHER): Payer: Self-pay

## 2016-03-07 ENCOUNTER — Ambulatory Visit (INDEPENDENT_AMBULATORY_CARE_PROVIDER_SITE_OTHER): Admitting: Family Medicine

## 2016-03-07 ENCOUNTER — Encounter: Payer: Self-pay | Admitting: *Deleted

## 2016-03-07 ENCOUNTER — Ambulatory Visit (INDEPENDENT_AMBULATORY_CARE_PROVIDER_SITE_OTHER)

## 2016-03-07 VITALS — BP 123/80 | HR 56 | Temp 98.1°F | Ht 69.0 in | Wt 180.0 lb

## 2016-03-07 DIAGNOSIS — E1169 Type 2 diabetes mellitus with other specified complication: Secondary | ICD-10-CM | POA: Diagnosis not present

## 2016-03-07 DIAGNOSIS — Z1211 Encounter for screening for malignant neoplasm of colon: Secondary | ICD-10-CM | POA: Diagnosis not present

## 2016-03-07 DIAGNOSIS — E291 Testicular hypofunction: Secondary | ICD-10-CM | POA: Diagnosis not present

## 2016-03-07 DIAGNOSIS — I10 Essential (primary) hypertension: Secondary | ICD-10-CM

## 2016-03-07 DIAGNOSIS — E785 Hyperlipidemia, unspecified: Secondary | ICD-10-CM

## 2016-03-07 DIAGNOSIS — E559 Vitamin D deficiency, unspecified: Secondary | ICD-10-CM | POA: Diagnosis not present

## 2016-03-07 DIAGNOSIS — E349 Endocrine disorder, unspecified: Secondary | ICD-10-CM

## 2016-03-07 LAB — URINALYSIS, COMPLETE
Bilirubin, UA: NEGATIVE
Glucose, UA: NEGATIVE
KETONES UA: NEGATIVE
Leukocytes, UA: NEGATIVE
Nitrite, UA: NEGATIVE
PH UA: 8 — AB (ref 5.0–7.5)
Protein, UA: NEGATIVE
RBC UA: NEGATIVE
Specific Gravity, UA: 1.015 (ref 1.005–1.030)
Urobilinogen, Ur: 0.2 mg/dL (ref 0.2–1.0)

## 2016-03-07 LAB — MICROSCOPIC EXAMINATION
Bacteria, UA: NONE SEEN
EPITHELIAL CELLS (NON RENAL): NONE SEEN /HPF (ref 0–10)
RBC MICROSCOPIC, UA: NONE SEEN /HPF (ref 0–?)

## 2016-03-07 NOTE — Patient Instructions (Addendum)
Continue current medications. Continue good therapeutic lifestyle changes which include good diet and exercise. Fall precautions discussed with patient. If an FOBT was given today- please return it to our front desk. If you are over 60 years old - you may need Prevnar 72 or the adult Pneumonia vaccine.  **Flu shots are available--- please call and schedule a FLU-CLINIC appointment**  After your visit with [place] today you will receive a survey in the mail or online from [employer] regarding your care with [place]. Please take a moment to fill this out. Your feedback is very important to [place] as you can help [place] better understand your patient needs as well as improve your experience and satisfaction. WE CARE ABOUT YOU!!!   Bring medicines in the morning so we can get the blood pressure medicines correctly printed It is important that you come to the office at least every 4 months because of your blood sugar history We will do rectal exams and PSAs every 8 months Bring pill bottles in the morning as directed

## 2016-03-07 NOTE — Progress Notes (Signed)
Subjective:    Patient ID: Terry [name], male    DOB: 21-May-1956, 60 y.o.   MRN: [id number]  HPI Pt here for follow up and management of chronic medical problems which includes testosterone def. And hypertension. He is taking medications regularly.The last office visit was over 1 year ago. The patient brings in blood sugars and blood pressures for review over the past several months. The blood sugars seem to be pretty stable and running in the 120-150 range in the morning and in the 150 range at bedtime. Blood pressures appear to be running somewhat higher in the 140 over the 80-90 range. Also the patient appears to be taking two angiotensin receptor blockers. Patient today has no specific complaints.He denies chest pain or shortness of breath. He has no trouble with his GI tract and no trouble with swallowing heartburn indigestion nausea vomiting diarrhea blood in the stool or black tarry bowel movements. He is passing his water without problems. He is back on a testosterone gel that he gets from the [place]. He has had an eye exam in the past couple of months with Dr. Dolores Lory office. His energy level is good and he exercises regularly.     Patient Active Problem List   Diagnosis Date Noted  . Obstructive sleep apnea 06/05/2015  . Esophageal reflux   . Elevated blood sugar   . BPH (benign prostatic hyperplasia)   . Unspecified hemorrhoids without mention of complication   . testosterone deficiency   . hyperlipidemia   . Essential hypertension, benign   . Unspecified glaucoma    Outpatient Encounter Prescriptions as of 03/07/2016  Medication Sig  . aspirin 81 MG EC tablet Take 81 mg by mouth daily.    . ATACAND 16 MG tablet TAKE 1 TABLET DAILY  . atorvastatin (LIPITOR) 20 MG tablet TAKE 1 TABLET DAILY AS DIRECTED  . bimatoprost (LUMIGAN) 0.01 % SOLN 1 drop at bedtime.  . Cholecalciferol (VITAMIN D3) 2000 UNITS TABS Take 2 tablets by mouth daily.   . Choline Fenofibrate (FENOFIBRIC ACID) 135 MG  CPDR Take 135 mg by mouth daily.  Marland Kitchen FREESTYLE LITE test strip USE TO TEST BLOOD SUGAR TWICE A DAY  . Magnesium 500 MG CAPS Take 1 capsule by mouth daily.  . metFORMIN (GLUCOPHAGE) 500 MG tablet Take 1 tablet (500 mg total) by mouth daily with breakfast.  . omega-3 acid ethyl esters (LOVAZA) 1 g capsule TAKE 2 CAPSULES TWICE A DAY  . OVER THE COUNTER MEDICATION Allergy relief otc, nasal spray, fluticasone spray  . testosterone (ANDROGEL) 50 MG/5GM (1%) GEL Place 5 g onto the skin daily.  . timolol (BETIMOL) 0.25 % ophthalmic solution 1-2 drops 2 (two) times daily.  . valsartan (DIOVAN) 80 MG tablet Take 1 tablet (80 mg total) by mouth daily.  . [DISCONTINUED] bimatoprost (LUMIGAN) 0.03 % ophthalmic solution 1 drop at bedtime.   No facility-administered encounter medications on file as of 03/07/2016.      Review of Systems  Constitutional: Negative.   HENT: Negative.   Eyes: Negative.   Respiratory: Negative.   Cardiovascular: Negative.   Gastrointestinal: Negative.   Endocrine: Negative.   Genitourinary: Negative.   Musculoskeletal: Negative.   Skin: Negative.   Allergic/Immunologic: Negative.   Neurological: Negative.   Hematological: Negative.   Psychiatric/Behavioral: Negative.        Objective:   Physical Exam  Constitutional: He is oriented to person, place, and time. He appears well-developed and well-nourished. No distress.  HENT:  Head: Normocephalic  and atraumatic.  Right Ear: External ear normal.  Left Ear: External ear normal.  Nose: Nose normal.  Mouth/Throat: Oropharynx is clear and moist. No oropharyngeal exudate.  Eyes: Conjunctivae and EOM are normal. Pupils are equal, round, and reactive to light. Right eye exhibits no discharge. Left eye exhibits no discharge. No scleral icterus.  Neck: Normal range of motion. Neck supple. No thyromegaly present.  Cardiovascular: Normal rate, regular rhythm, normal heart sounds and intact distal pulses.   No murmur  heard. At 72/m  Pulmonary/Chest: Effort normal and breath sounds normal. No respiratory distress. He has no wheezes. He has no rales. He exhibits no tenderness.  Clear anteriorly and posteriorly  Abdominal: Soft. Bowel sounds are normal. He exhibits no mass. There is no tenderness. There is no rebound and no guarding.  No bruits organ enlargement or masses  Genitourinary: Rectum normal and penis normal.  The prostate is slightly enlarged but smooth. There are no rectal masses. The external genitalia appear within normal limits with no inguinal hernias or inguinal nodes palpable.  Musculoskeletal: Normal range of motion. He exhibits no edema or tenderness.  Lymphadenopathy:    He has no cervical adenopathy.  Neurological: He is alert and oriented to person, place, and time. He has normal reflexes. No cranial nerve deficit.  Skin: Skin is warm and dry. No rash noted.  Psychiatric: He has a normal mood and affect. His behavior is normal. Judgment and thought content normal.  Nursing note and vitals reviewed.   BP 123/80 mmHg  Pulse 56  Temp(Src) 98.1 F (36.7 C) (Oral)  Ht _0  (1.753 m)  Wt 180 lb (81.647 kg)  BMI 26.57 kg/m2       Assessment & Plan:  1. Vitamin D deficiency -Continue current treatment pending results of lab work - CBC with Differential/Platelet - VITAMIN D 25 Hydroxy (Vit-D Deficiency, Fractures)  2. Essential hypertension, benign -The patient currently has 2 angiotensin receptor blockers. We not exactly certain of the milligrams of the dosage of these pills. He will bring these bottles to the office in the morning so we can take him off of one and increase the other. He is currently taking Atacand and valsartan. - BMP8+EGFR - CBC with Differential/Platelet - Hepatic function panel - NMR, lipoprofile - DG Chest 2 View; Future  3. Testosterone deficiency -He is applying testosterone gel once daily. He stopped this in the past but has started using this  again. - CBC with Differential/Platelet - PSA, total and free - Testosterone,Free and Total - Urinalysis, Complete  4. Special screening for malignant neoplasms, colon - Fecal occult blood, imunochemical; Future  5. Hyperlipidemia associated with type 2 diabetes mellitus ([place]) -Continue with aggressive therapeutic lifestyle changes and metformin therapy. -Continue with atorvastatin. This is all pending results of lab work.  Meds ordered this encounter  Medications  . bimatoprost (LUMIGAN) 0.01 % SOLN    Sig: 1 drop at bedtime.  Marland Kitchen OVER THE COUNTER MEDICATION    Sig: Allergy relief otc, nasal spray, fluticasone spray   Patient Instructions  Continue current medications. Continue good therapeutic lifestyle changes which include good diet and exercise. Fall precautions discussed with patient. If an FOBT was given today- please return it to our front desk. If you are over 17 years old - you may need Prevnar 4 or the adult Pneumonia vaccine.  **Flu shots are available--- please call and schedule a FLU-CLINIC appointment**  After your visit with [place] today you will receive a survey in the  mail or online from [employer] regarding your care with [place]. Please take a moment to fill this out. Your feedback is very important to [place] as you can help [place] better understand your patient needs as well as improve your experience and satisfaction. WE CARE ABOUT YOU!!!   Bring medicines in the morning so we can get the blood pressure medicines correctly printed It is important that you come to the office at least every 4 months because of your blood sugar history We will do rectal exams and PSAs every 8 months Bring pill bottles in the morning as directed   Arrie Senate MD

## 2016-03-08 ENCOUNTER — Telehealth: Payer: Self-pay | Admitting: *Deleted

## 2016-03-08 LAB — CBC WITH DIFFERENTIAL/PLATELET
BASOS ABS: 0 10*3/uL (ref 0.0–0.2)
Basos: 1 %
EOS (ABSOLUTE): 0.1 10*3/uL (ref 0.0–0.4)
Eos: 2 %
HEMATOCRIT: 42.3 % (ref 37.5–51.0)
HEMOGLOBIN: 14 g/dL (ref 12.6–17.7)
IMMATURE GRANS (ABS): 0 10*3/uL (ref 0.0–0.1)
Immature Granulocytes: 0 %
LYMPHS ABS: 3 10*3/uL (ref 0.7–3.1)
Lymphs: 53 %
MCH: 29.9 pg (ref 26.6–33.0)
MCHC: 33.1 g/dL (ref 31.5–35.7)
MCV: 90 fL (ref 79–97)
MONOS ABS: 0.4 10*3/uL (ref 0.1–0.9)
Monocytes: 6 %
NEUTROS ABS: 2.1 10*3/uL (ref 1.4–7.0)
Neutrophils: 38 %
Platelets: 260 10*3/uL (ref 150–379)
RBC: 4.68 x10E6/uL (ref 4.14–5.80)
RDW: 13.7 % (ref 12.3–15.4)
WBC: 5.6 10*3/uL (ref 3.4–10.8)

## 2016-03-08 LAB — PSA, TOTAL AND FREE
PROSTATE SPECIFIC AG, SERUM: 2.4 ng/mL (ref 0.0–4.0)
PSA FREE: 0.9 ng/mL
PSA, Free Pct: 37.5 %

## 2016-03-08 LAB — NMR, LIPOPROFILE
CHOLESTEROL: 160 mg/dL (ref 100–199)
HDL Cholesterol by NMR: 52 mg/dL (ref >39)
HDL PARTICLE NUMBER: 40 umol/L (ref >=30.5)
LDL PARTICLE NUMBER: 1004 nmol/L — AB (ref <1000)
LDL SIZE: 20.5 nm (ref >20.5)
LDL-C: 74 mg/dL (ref 0–99)
LP-IR SCORE: 52 — AB (ref <=45)
SMALL LDL PARTICLE NUMBER: 458 nmol/L (ref <=527)
Triglycerides by NMR: 169 mg/dL — ABNORMAL HIGH (ref 0–149)

## 2016-03-08 LAB — BMP8+EGFR
BUN / CREAT RATIO: 18 (ref 9–20)
BUN: 20 mg/dL (ref 6–24)
CALCIUM: 10.1 mg/dL (ref 8.7–10.2)
CHLORIDE: 101 mmol/L (ref 96–106)
CO2: 22 mmol/L (ref 18–29)
Creatinine, Ser: 1.09 mg/dL (ref 0.76–1.27)
GFR calc non Af Amer: 74 mL/min/{1.73_m2} (ref >59)
GFR, EST AFRICAN AMERICAN: 85 mL/min/{1.73_m2} (ref >59)
Glucose: 101 mg/dL — ABNORMAL HIGH (ref 65–99)
POTASSIUM: 4.8 mmol/L (ref 3.5–5.2)
SODIUM: 141 mmol/L (ref 134–144)

## 2016-03-08 LAB — HEPATIC FUNCTION PANEL
ALK PHOS: 39 IU/L (ref 39–117)
ALT: 42 IU/L (ref 0–44)
AST: 37 IU/L (ref 0–40)
Albumin: 5.1 g/dL (ref 3.5–5.5)
BILIRUBIN, DIRECT: 0.15 mg/dL (ref 0.00–0.40)
Bilirubin Total: 0.5 mg/dL (ref 0.0–1.2)
TOTAL PROTEIN: 7.5 g/dL (ref 6.0–8.5)

## 2016-03-08 LAB — VITAMIN D 25 HYDROXY (VIT D DEFICIENCY, FRACTURES): Vit D, 25-Hydroxy: 47.2 ng/mL (ref 30.0–100.0)

## 2016-03-08 LAB — TESTOSTERONE,FREE AND TOTAL
TESTOSTERONE: 214 ng/dL — AB (ref 348–1197)
Testosterone, Free: 6.6 pg/mL — ABNORMAL LOW (ref 7.2–24.0)

## 2016-03-08 MED ORDER — VALSARTAN 160 MG PO TABS: 160.0000 mg | ORAL_TABLET | Freq: Every day | ORAL | Status: DC

## 2016-03-08 MED ORDER — METFORMIN HCL 500 MG PO TABS: 500.0000 mg | ORAL_TABLET | Freq: Every day | ORAL | Status: DC

## 2016-03-08 NOTE — Addendum Note (Signed)
Addended by: Zannie Cove on: 03/08/2016 09:05 AM   Modules accepted: SmartSet

## 2016-03-08 NOTE — Telephone Encounter (Signed)
Pt came by today with all medications in hand - we needed to clarify what all he is currently taking.  meds changed in chart to reflect correct meds.  Note - DWM will  D/C Atacand 16 mg and we are increasing Valsartan from 80 to 160mg . ----    Pt aware to come by for BP and BMP in 4 weeks.  We are waiting in the testosterone labs to return - this may need to be changed as well

## 2016-03-08 NOTE — Addendum Note (Signed)
Addended by: Zannie Cove on: 03/08/2016 09:16 AM   Modules accepted: Miquel Dunn

## 2016-03-13 ENCOUNTER — Other Ambulatory Visit

## 2016-03-13 DIAGNOSIS — Z1211 Encounter for screening for malignant neoplasm of colon: Secondary | ICD-10-CM

## 2016-03-16 LAB — FECAL OCCULT BLOOD, IMMUNOCHEMICAL: Fecal Occult Bld: NEGATIVE

## 2016-03-17 ENCOUNTER — Other Ambulatory Visit: Payer: Self-pay | Admitting: Family Medicine

## 2016-04-06 ENCOUNTER — Other Ambulatory Visit

## 2016-04-06 ENCOUNTER — Telehealth: Payer: Self-pay | Admitting: Family Medicine

## 2016-04-06 DIAGNOSIS — I10 Essential (primary) hypertension: Secondary | ICD-10-CM

## 2016-04-06 DIAGNOSIS — R7309 Other abnormal glucose: Secondary | ICD-10-CM

## 2016-04-07 LAB — BMP8+EGFR
BUN/Creatinine Ratio: 21 — ABNORMAL HIGH (ref 9–20)
BUN: 21 mg/dL (ref 6–24)
CO2: 21 mmol/L (ref 18–29)
CREATININE: 0.98 mg/dL (ref 0.76–1.27)
Calcium: 10 mg/dL (ref 8.7–10.2)
Chloride: 102 mmol/L (ref 96–106)
GFR calc Af Amer: 97 mL/min/{1.73_m2} (ref >59)
GFR, EST NON AFRICAN AMERICAN: 84 mL/min/{1.73_m2} (ref >59)
GLUCOSE: 151 mg/dL — AB (ref 65–99)
Potassium: 4.6 mmol/L (ref 3.5–5.2)
SODIUM: 142 mmol/L (ref 134–144)

## 2016-04-09 ENCOUNTER — Other Ambulatory Visit: Payer: Self-pay | Admitting: *Deleted

## 2016-04-09 MED ORDER — VALSARTAN 160 MG PO TABS: ORAL_TABLET | ORAL | Status: DC

## 2016-04-09 NOTE — Addendum Note (Signed)
Addended by: Wardell Heath on: 04/09/2016 04:18 PM   Modules accepted: Orders

## 2016-04-09 NOTE — Telephone Encounter (Signed)
diovan dose change - per DWM  Paper BP readings - to be scanned in

## 2016-04-13 NOTE — Telephone Encounter (Signed)
Pt aware by CB today

## 2016-05-21 ENCOUNTER — Ambulatory Visit: Admitting: Internal Medicine

## 2016-05-21 ENCOUNTER — Ambulatory Visit (INDEPENDENT_AMBULATORY_CARE_PROVIDER_SITE_OTHER): Admitting: Internal Medicine

## 2016-05-21 ENCOUNTER — Encounter: Payer: Self-pay | Admitting: Internal Medicine

## 2016-05-21 VITALS — BP 110/78 | HR 45 | Ht 69.0 in | Wt 182.6 lb

## 2016-05-21 DIAGNOSIS — J309 Allergic rhinitis, unspecified: Secondary | ICD-10-CM

## 2016-05-21 DIAGNOSIS — G4733 Obstructive sleep apnea (adult) (pediatric): Secondary | ICD-10-CM

## 2016-05-21 DIAGNOSIS — J302 Other seasonal allergic rhinitis: Secondary | ICD-10-CM

## 2016-05-21 DIAGNOSIS — J3089 Other allergic rhinitis: Secondary | ICD-10-CM

## 2016-05-21 NOTE — Patient Instructions (Signed)
We can continue CPAP auto 5-15/ [facility]  We talked about trying Sudafed at bedtime, otc nasal saline gel, Breathe Right nasal strips, resumption of fluticasone nasal spray, as ways to help your nose work a little more comfortably  Please call as needed

## 2016-05-21 NOTE — Assessment & Plan Note (Signed)
We discussed compliance goals. He seems to be doing very well. Download demonstrates sufficient use. Apparently nasal congestion gets in his way at times.

## 2016-05-21 NOTE — Assessment & Plan Note (Signed)
Recognizes seasonal variation. Never skin tested. He has some septal deviation but has not been interested in ENT evaluation for possible repair. We discussed resumption of Flonase which has helped in the past, consider Singulair, consider Breathe Right nasal strips.

## 2016-05-21 NOTE — Progress Notes (Signed)
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 Morrie Sheldon consult for sleeping & snoring,wife states he makes a pa-pa-pa noise while sleeping His snoring disturbs his wife's sleep. He has tried various over-the-counter products and a nonprescription oral appliance. Not usually sleepy during the daytime but nods off watching TV in the evening. Sleep pattern is adequate on review. No ENT surgery. History of seasonal allergic rhinitis between April and June treated OTC. Some perennial sinus congestion. Hypertension. He denies other heart or lung disease.  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 needs new supplies.  sometimes gets nasal congestion at night wearing CPAP.  He is very comfortable with this pressure. Compliance is adequate and control is good. He notices some nasal congestion especially in pollen season that sometimes interferes with CPAP use, but is not caused by CPAP. Uses occasional decongestant nasal spray and understands to watch out for overuse and rebound.  ROS-see HPI   Negative unless "+" Constitutional:    weight loss, night sweats, fevers, chills, fatigue, lassitude. HEENT:    headaches, difficulty swallowing, tooth/dental problems, sore throat,       sneezing, itching, ear ache, +nasal congestion, post nasal drip, snoring CV:    chest pain, orthopnea, PND, swelling in lower extremities, anasarca,                                                     dizziness, palpitations Resp:   shortness of breath with exertion or at rest.                productive cough,   non-productive cough, coughing up of blood.              change in color of mucus.  wheezing.   Skin:     rash or lesions. GI:  No-   heartburn, indigestion, abdominal pain, nausea, vomiting, diarrhea,                 change in bowel habits, loss of appetite GU: dysuria, change in color of urine, no urgency or frequency.   flank pain. MS:   joint pain, stiffness, decreased range of motion, back pain. Neuro-     nothing unusual Psych:  change in mood or affect.  depression or anxiety.   memory loss.  OBJ- Physical Exam General- Alert, Oriented, Affect-appropriate, Distress- none acute, medium build Skin- rash-none, lesions- none, excoriation- none Lymphadenopathy- none Head- atraumatic            Eyes- Gross vision intact, PERRLA, conjunctivae and secretions clear            Ears- Hearing, canals-normal            Nose- Clear, Septal dev+, mucus, polyps, erosion, perforation             Throat- Mallampati IV , mucosa clear , drainage- none, tonsils- atrophic, own teeth Neck- flexible , trachea midline, no stridor , thyroid nl, carotid no bruit Chest - symmetrical excursion , unlabored           Heart/CV- RRR , no  murmur , no gallop  , no rub, nl s1 s2                           - JVD- none , edema- none, stasis changes- none, varices- none           Lung- clear to P&A, wheeze- none, cough- none , dullness-none, rub- none           Chest wall-  Abd-  Br/ Gen/ Rectal- Not done, not indicated Extrem- cyanosis- none, clubbing, none, atrophy- none, strength- nl Neuro- grossly intact to observation

## 2016-05-24 ENCOUNTER — Other Ambulatory Visit

## 2016-05-24 DIAGNOSIS — E875 Hyperkalemia: Secondary | ICD-10-CM

## 2016-05-24 DIAGNOSIS — R7309 Other abnormal glucose: Secondary | ICD-10-CM

## 2016-05-24 LAB — BAYER DCA HB A1C WAIVED: HB A1C (BAYER DCA - WAIVED): 6.5 % (ref <7.0)

## 2016-05-25 ENCOUNTER — Other Ambulatory Visit: Payer: Self-pay | Admitting: *Deleted

## 2016-05-25 DIAGNOSIS — E875 Hyperkalemia: Secondary | ICD-10-CM

## 2016-05-25 LAB — BMP8+EGFR
BUN/Creatinine Ratio: 23 — ABNORMAL HIGH (ref 9–20)
BUN: 24 mg/dL (ref 6–24)
CALCIUM: 10.5 mg/dL — AB (ref 8.7–10.2)
CHLORIDE: 100 mmol/L (ref 96–106)
CO2: 23 mmol/L (ref 18–29)
Creatinine, Ser: 1.04 mg/dL (ref 0.76–1.27)
GFR calc Af Amer: 90 mL/min/{1.73_m2} (ref >59)
GFR calc non Af Amer: 78 mL/min/{1.73_m2} (ref >59)
GLUCOSE: 122 mg/dL — AB (ref 65–99)
POTASSIUM: 5.9 mmol/L — AB (ref 3.5–5.2)
SODIUM: 141 mmol/L (ref 134–144)

## 2016-05-29 ENCOUNTER — Encounter: Payer: Self-pay | Admitting: Internal Medicine

## 2016-06-01 ENCOUNTER — Other Ambulatory Visit

## 2016-06-01 ENCOUNTER — Telehealth: Payer: Self-pay | Admitting: Family Medicine

## 2016-06-01 ENCOUNTER — Other Ambulatory Visit: Payer: Self-pay | Admitting: Family Medicine

## 2016-06-01 DIAGNOSIS — E875 Hyperkalemia: Secondary | ICD-10-CM

## 2016-06-02 LAB — BMP8+EGFR
BUN / CREAT RATIO: 25 — AB (ref 9–20)
BUN: 24 mg/dL (ref 6–24)
CALCIUM: 10.5 mg/dL — AB (ref 8.7–10.2)
CHLORIDE: 101 mmol/L (ref 96–106)
CO2: 23 mmol/L (ref 18–29)
CREATININE: 0.96 mg/dL (ref 0.76–1.27)
GFR, EST AFRICAN AMERICAN: 100 mL/min/{1.73_m2} (ref >59)
GFR, EST NON AFRICAN AMERICAN: 86 mL/min/{1.73_m2} (ref >59)
Glucose: 115 mg/dL — ABNORMAL HIGH (ref 65–99)
Potassium: 4.5 mmol/L (ref 3.5–5.2)
Sodium: 140 mmol/L (ref 134–144)

## 2016-06-04 ENCOUNTER — Other Ambulatory Visit: Payer: Self-pay | Admitting: *Deleted

## 2016-06-04 MED ORDER — TESTOSTERONE 50 MG/5GM (1%) TD GEL: 5.0000 g | Freq: Every day | TRANSDERMAL | 1 refills | Status: DC

## 2016-06-04 MED ORDER — OMEGA-3-ACID ETHYL ESTERS 1 G PO CAPS: 2.0000 | ORAL_CAPSULE | Freq: Two times a day (BID) | ORAL | 3 refills | Status: DC

## 2016-06-04 NOTE — Telephone Encounter (Signed)
Pt aware to pick one up and other sent to mail order

## 2016-06-05 ENCOUNTER — Telehealth: Payer: Self-pay | Admitting: Family Medicine

## 2016-06-06 NOTE — Telephone Encounter (Signed)
lmtcb 8/2/jhb

## 2016-06-07 NOTE — Telephone Encounter (Signed)
Pt called and wants PTH and vit d  - labs planned and he will also get shingles with nurse next week

## 2016-06-14 ENCOUNTER — Other Ambulatory Visit

## 2016-06-15 LAB — PTH, INTACT AND CALCIUM
Calcium: 10.2 mg/dL (ref 8.6–10.2)
PTH: 17 pg/mL (ref 15–65)

## 2016-06-15 LAB — VITAMIN D 25 HYDROXY (VIT D DEFICIENCY, FRACTURES): VIT D 25 HYDROXY: 53.3 ng/mL (ref 30.0–100.0)

## 2016-07-11 ENCOUNTER — Ambulatory Visit: Admitting: Family Medicine

## 2016-07-12 ENCOUNTER — Other Ambulatory Visit: Payer: Self-pay | Admitting: Family Medicine

## 2016-07-26 ENCOUNTER — Other Ambulatory Visit: Payer: Self-pay | Admitting: Family Medicine

## 2016-07-26 NOTE — Telephone Encounter (Signed)
Refill request for Candesartan, I don't see on his med list. Ok to refill?

## 2016-08-01 ENCOUNTER — Encounter: Payer: Self-pay | Admitting: Family Medicine

## 2016-08-01 ENCOUNTER — Ambulatory Visit (INDEPENDENT_AMBULATORY_CARE_PROVIDER_SITE_OTHER): Admitting: Family Medicine

## 2016-08-01 VITALS — BP 119/81 | HR 42 | Temp 97.7°F | Ht 69.0 in | Wt 175.0 lb

## 2016-08-01 DIAGNOSIS — E785 Hyperlipidemia, unspecified: Secondary | ICD-10-CM | POA: Diagnosis not present

## 2016-08-01 DIAGNOSIS — E291 Testicular hypofunction: Secondary | ICD-10-CM | POA: Diagnosis not present

## 2016-08-01 DIAGNOSIS — E559 Vitamin D deficiency, unspecified: Secondary | ICD-10-CM | POA: Diagnosis not present

## 2016-08-01 DIAGNOSIS — I10 Essential (primary) hypertension: Secondary | ICD-10-CM

## 2016-08-01 DIAGNOSIS — E119 Type 2 diabetes mellitus without complications: Secondary | ICD-10-CM | POA: Diagnosis not present

## 2016-08-01 DIAGNOSIS — Z23 Encounter for immunization: Secondary | ICD-10-CM | POA: Diagnosis not present

## 2016-08-01 DIAGNOSIS — E349 Endocrine disorder, unspecified: Secondary | ICD-10-CM

## 2016-08-01 DIAGNOSIS — E1169 Type 2 diabetes mellitus with other specified complication: Secondary | ICD-10-CM

## 2016-08-01 NOTE — Progress Notes (Signed)
Subjective:    Patient ID: Terry [name], male    DOB: 1956/10/20, 60 y.o.   MRN: [id number]  HPI Pt here for follow up and management of chronic medical problems which includes hyperlipidemia and diabetes. He is taking medications regularly.The patient is doing well overall. He brings in a good record of blood sugars and blood pressures from the past several months for review today. The blood sugars appear to be improving and the blood pressures also appear to be improving. His weight today is down about 8 pounds from his last visit here back in July. His vital signs are stable. Pulse rate was a little bit low and we will recheck that we go into the room. He is going to get his flu shot today and will check with his insurance regarding the Prevnar vaccine. The patient indicates he is sort of been down and out and has recently gotten motivated to do more about his weight and exercise more and this has made him feel a lot better. His weight is down 8 pounds. He exercises regularly goes to the fitness center when he doesn't exercise and this has made him feel better. He denies any chest pain palpitations or shortness of breath. He denies any problems with his intestinal tract including nausea vomiting or blood in the stool black tarry bowel movements or change in bowel habits. He's passing his water well. He continues to use his testosterone regularly and feels good with this and has lots of energy. His attitude is definitely more positive.     Patient Active Problem List   Diagnosis Date Noted  . Seasonal and perennial allergic rhinitis 05/21/2016  . Obstructive sleep apnea 06/05/2015  . Esophageal reflux   . Elevated blood sugar   . BPH (benign prostatic hyperplasia)   . Unspecified hemorrhoids without mention of complication   . Testosterone deficiency   . Hyperlipidemia associated with type 2 diabetes mellitus ([place])   . Essential hypertension, benign   . Unspecified glaucoma    Outpatient  Encounter Prescriptions as of 08/01/2016  Medication Sig  . aspirin 81 MG EC tablet Take 81 mg by mouth daily.    Marland Kitchen atorvastatin (LIPITOR) 20 MG tablet TAKE 1 TABLET DAILY AS DIRECTED (MUST BE SEEN BEFORE NEXT REFILL)  . bimatoprost (LUMIGAN) 0.01 % SOLN 1 drop at bedtime.  . candesartan (ATACAND) 16 MG tablet TAKE 1 TABLET DAILY  . Cholecalciferol (VITAMIN D3) 2000 UNITS TABS Take 2 tablets by mouth daily.   . Choline Fenofibrate (FENOFIBRIC ACID) 135 MG CPDR TAKE 1 CAPSULE DAILY  . FREESTYLE LITE test strip USE TO TEST BLOOD SUGAR TWICE A DAY  . metFORMIN (GLUCOPHAGE) 500 MG tablet Take 1 tablet (500 mg total) by mouth daily with breakfast.  . omega-3 acid ethyl esters (LOVAZA) 1 g capsule Take 2 capsules (2 g total) by mouth 2 (two) times daily.  Marland Kitchen testosterone (ANDROGEL) 50 MG/5GM (1%) GEL Place 5 g onto the skin daily.  . timolol (BETIMOL) 0.25 % ophthalmic solution 1-2 drops 2 (two) times daily.  . valsartan (DIOVAN) 160 MG tablet Take 1 whole tab in the AM and 1/2 tab in the afternoon  . OVER THE COUNTER MEDICATION Allergy relief otc, nasal spray, fluticasone spray  . [DISCONTINUED] Magnesium 500 MG CAPS Take 1 capsule by mouth daily.   No facility-administered encounter medications on file as of 08/01/2016.       Review of Systems  Constitutional: Negative.   HENT: Negative.   Eyes:  Negative.   Respiratory: Negative.   Cardiovascular: Negative.   Gastrointestinal: Negative.   Endocrine: Negative.   Genitourinary: Negative.   Musculoskeletal: Negative.   Skin: Negative.   Allergic/Immunologic: Negative.   Neurological: Negative.   Hematological: Negative.   Psychiatric/Behavioral: Negative.        Objective:   Physical Exam  Constitutional: He is oriented to person, place, and time. He appears well-developed and well-nourished. No distress.  HENT:  Head: Normocephalic and atraumatic.  Right Ear: External ear normal.  Left Ear: External ear normal.  Nose: Nose  normal.  Mouth/Throat: Oropharynx is clear and moist. No oropharyngeal exudate.  Eyes: Conjunctivae and EOM are normal. Pupils are equal, round, and reactive to light. Right eye exhibits no discharge. Left eye exhibits no discharge. No scleral icterus.  Neck: Normal range of motion. Neck supple. No thyromegaly present.  Cardiovascular: Normal rate, regular rhythm, normal heart sounds and intact distal pulses.   No murmur heard. The heart was regular at 48/m.  Pulmonary/Chest: Effort normal and breath sounds normal. No respiratory distress. He has no wheezes. He has no rales. He exhibits no tenderness.  No axillary adenopathy and lungs were clear anteriorly and posteriorly  Abdominal: Soft. Bowel sounds are normal. He exhibits no mass. There is no tenderness. There is no rebound and no guarding.  No abdominal tenderness masses or organ enlargement  Genitourinary: Rectum normal and penis normal.  Genitourinary Comments: The prostate was minimally enlarged. There were no lumps or masses. It was smooth in character. The rectal exam was clear of masses. The external genitalia revealed no hernia and testicles were normal and everything was within normal limits.  Musculoskeletal: Normal range of motion. He exhibits no edema.  Lymphadenopathy:    He has no cervical adenopathy.  Neurological: He is alert and oriented to person, place, and time. He has normal reflexes. No cranial nerve deficit.  Skin: Skin is warm and dry. No rash noted.  Psychiatric: He has a normal mood and affect. His behavior is normal. Judgment and thought content normal.  Nursing note and vitals reviewed.  BP 119/81 (BP Location: Left Arm)   Pulse (!) 42   Temp 97.7 F (36.5 C) (Oral)   Ht _0  (1.753 m)   Wt 175 lb (79.4 kg)   BMI 25.84 kg/m         Assessment & Plan:  1. Vitamin D deficiency -Continue current treatment pending results of lab work - CBC with Differential/Platelet; Future - VITAMIN D 25 Hydroxy  (Vit-D Deficiency, Fractures); Future  2. Essential hypertension, benign -The blood pressure was good today and all of his home readings especially in the past couple months have been better since his weight loss. He will continue to manage this with his current treatment and with sodium restriction and exercise - BMP8+EGFR; Future - CBC with Differential/Platelet; Future - Hepatic function panel; Future  3. Hyperlipidemia associated with type 2 diabetes mellitus ([place]) -Continue with current treatment pending results of lab work - CBC with Differential/Platelet; Future - Lipid panel; Future  4. Testosterone deficiency -The patient's energy level is good and improved and he will continue with his current AndroGel treatment. - CBC with Differential/Platelet; Future - Testosterone,Free and Total; Future - PSA, total and free; Future  5. Type 2 diabetes mellitus without complication, without long-term current use of insulin ([place]) -Continue with metformin and therapeutic lifestyle changes pending results of lab work - [facility] Hb A1c Waived; Future - CBC with Differential/Platelet; Future  Patient Instructions  Continue current medications. Continue good therapeutic lifestyle changes which include good diet and exercise. Fall precautions discussed with patient. If an FOBT was given today- please return it to our front desk. If you are over 80 years old - you may need Prevnar 54 or the adult Pneumonia vaccine.  **Flu shots are available--- please call and schedule a FLU-CLINIC appointment**  After your visit with [place] today you will receive a survey in the mail or online from [employer] regarding your care with [place]. Please take a moment to fill this out. Your feedback is very important to [place] as you can help [place] better understand your patient needs as well as improve your experience and satisfaction. WE CARE ABOUT YOU!!!   The patient should check with his insurance regarding the Prevnar  vaccine and the shingles shot He will receive the flu shot today and it may make his arm sore He is to be congratulated on losing weight and taking care of his body by exercising more and following his diet more closely. He should continue these therapeutic lifestyle changes Continue to monitor blood pressures and blood sugars at home and bring these readings to the next visit    Arrie Senate MD

## 2016-08-01 NOTE — Patient Instructions (Addendum)
Continue current medications. Continue good therapeutic lifestyle changes which include good diet and exercise. Fall precautions discussed with patient. If an FOBT was given today- please return it to our front desk. If you are over 60 years old - you may need Prevnar 31 or the adult Pneumonia vaccine.  **Flu shots are available--- please call and schedule a FLU-CLINIC appointment**  After your visit with [place] today you will receive a survey in the mail or online from [employer] regarding your care with [place]. Please take a moment to fill this out. Your feedback is very important to [place] as you can help [place] better understand your patient needs as well as improve your experience and satisfaction. WE CARE ABOUT YOU!!!   The patient should check with his insurance regarding the Prevnar vaccine and the shingles shot He will receive the flu shot today and it may make his arm sore He is to be congratulated on losing weight and taking care of his body by exercising more and following his diet more closely. He should continue these therapeutic lifestyle changes Continue to monitor blood pressures and blood sugars at home and bring these readings to the next visit

## 2016-08-13 ENCOUNTER — Encounter: Payer: Self-pay | Admitting: *Deleted

## 2016-08-17 ENCOUNTER — Other Ambulatory Visit: Payer: Self-pay | Admitting: *Deleted

## 2016-08-17 MED ORDER — VALSARTAN 160 MG PO TABS: ORAL_TABLET | ORAL | 1 refills | Status: DC

## 2016-09-07 ENCOUNTER — Other Ambulatory Visit (INDEPENDENT_AMBULATORY_CARE_PROVIDER_SITE_OTHER)

## 2016-09-07 DIAGNOSIS — E349 Endocrine disorder, unspecified: Secondary | ICD-10-CM

## 2016-09-07 DIAGNOSIS — I10 Essential (primary) hypertension: Secondary | ICD-10-CM

## 2016-09-07 DIAGNOSIS — E785 Hyperlipidemia, unspecified: Secondary | ICD-10-CM

## 2016-09-07 DIAGNOSIS — E559 Vitamin D deficiency, unspecified: Secondary | ICD-10-CM

## 2016-09-07 DIAGNOSIS — E1169 Type 2 diabetes mellitus with other specified complication: Secondary | ICD-10-CM

## 2016-09-07 DIAGNOSIS — E119 Type 2 diabetes mellitus without complications: Secondary | ICD-10-CM

## 2016-09-07 LAB — BAYER DCA HB A1C WAIVED: HB A1C (BAYER DCA - WAIVED): 6.1 % (ref <7.0)

## 2016-09-08 LAB — BMP8+EGFR
BUN/Creatinine Ratio: 23 (ref 10–24)
BUN: 24 mg/dL (ref 8–27)
CALCIUM: 10.3 mg/dL — AB (ref 8.6–10.2)
CO2: 21 mmol/L (ref 18–29)
CREATININE: 1.05 mg/dL (ref 0.76–1.27)
Chloride: 99 mmol/L (ref 96–106)
GFR calc Af Amer: 89 mL/min/{1.73_m2} (ref >59)
GFR calc non Af Amer: 77 mL/min/{1.73_m2} (ref >59)
GLUCOSE: 123 mg/dL — AB (ref 65–99)
Potassium: 5.4 mmol/L — ABNORMAL HIGH (ref 3.5–5.2)
Sodium: 141 mmol/L (ref 134–144)

## 2016-09-08 LAB — LIPID PANEL
CHOL/HDL RATIO: 2.6 ratio (ref 0.0–5.0)
Cholesterol, Total: 139 mg/dL (ref 100–199)
HDL: 54 mg/dL (ref >39)
LDL CALC: 60 mg/dL (ref 0–99)
TRIGLYCERIDES: 126 mg/dL (ref 0–149)
VLDL CHOLESTEROL CAL: 25 mg/dL (ref 5–40)

## 2016-09-08 LAB — TESTOSTERONE,FREE AND TOTAL
Testosterone, Free: 14 pg/mL (ref 6.6–18.1)
Testosterone: 529 ng/dL (ref 264–916)

## 2016-09-08 LAB — HEPATIC FUNCTION PANEL
ALBUMIN: 5.1 g/dL — AB (ref 3.6–4.8)
ALT: 41 IU/L (ref 0–44)
AST: 29 IU/L (ref 0–40)
Alkaline Phosphatase: 40 IU/L (ref 39–117)
BILIRUBIN TOTAL: 0.5 mg/dL (ref 0.0–1.2)
Bilirubin, Direct: 0.14 mg/dL (ref 0.00–0.40)
TOTAL PROTEIN: 7.3 g/dL (ref 6.0–8.5)

## 2016-09-08 LAB — CBC WITH DIFFERENTIAL/PLATELET
Basophils Absolute: 0 10*3/uL (ref 0.0–0.2)
Basos: 1 %
EOS (ABSOLUTE): 0.1 10*3/uL (ref 0.0–0.4)
EOS: 1 %
HEMATOCRIT: 41.9 % (ref 37.5–51.0)
HEMOGLOBIN: 14.3 g/dL (ref 12.6–17.7)
IMMATURE GRANULOCYTES: 0 %
Immature Grans (Abs): 0 10*3/uL (ref 0.0–0.1)
Lymphocytes Absolute: 2.9 10*3/uL (ref 0.7–3.1)
Lymphs: 43 %
MCH: 30.1 pg (ref 26.6–33.0)
MCHC: 34.1 g/dL (ref 31.5–35.7)
MCV: 88 fL (ref 79–97)
MONOCYTES: 8 %
MONOS ABS: 0.5 10*3/uL (ref 0.1–0.9)
NEUTROS PCT: 47 %
Neutrophils Absolute: 3.1 10*3/uL (ref 1.4–7.0)
Platelets: 315 10*3/uL (ref 150–379)
RBC: 4.75 x10E6/uL (ref 4.14–5.80)
RDW: 13.2 % (ref 12.3–15.4)
WBC: 6.7 10*3/uL (ref 3.4–10.8)

## 2016-09-08 LAB — PSA, TOTAL AND FREE
PROSTATE SPECIFIC AG, SERUM: 2.4 ng/mL (ref 0.0–4.0)
PSA, Free Pct: 45.4 %
PSA, Free: 1.09 ng/mL

## 2016-09-08 LAB — VITAMIN D 25 HYDROXY (VIT D DEFICIENCY, FRACTURES): Vit D, 25-Hydroxy: 29.5 ng/mL — ABNORMAL LOW (ref 30.0–100.0)

## 2016-09-10 ENCOUNTER — Other Ambulatory Visit: Payer: Self-pay | Admitting: *Deleted

## 2016-09-10 MED ORDER — CANDESARTAN CILEXETIL 16 MG PO TABS: 16.0000 mg | ORAL_TABLET | Freq: Every day | ORAL | 2 refills | Status: DC

## 2016-09-14 ENCOUNTER — Other Ambulatory Visit

## 2016-09-14 DIAGNOSIS — E875 Hyperkalemia: Secondary | ICD-10-CM

## 2016-09-14 LAB — BMP8+EGFR
BUN / CREAT RATIO: 25 — AB (ref 10–24)
BUN: 28 mg/dL — AB (ref 8–27)
CALCIUM: 10.2 mg/dL (ref 8.6–10.2)
CO2: 25 mmol/L (ref 18–29)
Chloride: 102 mmol/L (ref 96–106)
Creatinine, Ser: 1.1 mg/dL (ref 0.76–1.27)
GFR, EST AFRICAN AMERICAN: 84 mL/min/{1.73_m2} (ref >59)
GFR, EST NON AFRICAN AMERICAN: 73 mL/min/{1.73_m2} (ref >59)
Glucose: 118 mg/dL — ABNORMAL HIGH (ref 65–99)
Potassium: 5.6 mmol/L — ABNORMAL HIGH (ref 3.5–5.2)
Sodium: 140 mmol/L (ref 134–144)

## 2016-09-15 ENCOUNTER — Other Ambulatory Visit: Payer: Self-pay | Admitting: Family Medicine

## 2016-09-21 ENCOUNTER — Encounter: Payer: Self-pay | Admitting: Pharmacist

## 2016-09-21 ENCOUNTER — Ambulatory Visit (INDEPENDENT_AMBULATORY_CARE_PROVIDER_SITE_OTHER): Admitting: Pharmacist

## 2016-09-21 VITALS — BP 132/80 | HR 64

## 2016-09-21 DIAGNOSIS — I10 Essential (primary) hypertension: Secondary | ICD-10-CM | POA: Diagnosis not present

## 2016-09-21 DIAGNOSIS — E875 Hyperkalemia: Secondary | ICD-10-CM | POA: Diagnosis not present

## 2016-09-21 NOTE — Progress Notes (Signed)
Subjective:    Patient here for medication review and to see if any meds are cause of recent elevated potassium.  Terry [name] is a 60 yo male.  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, [place].  09/07/16 patient has labs checked and potassium was found to be elevated at 5.4.  BMET was repeated 09/14/16 and potassium was 5.6.   When I reviewed medications 09/17/16 - noticed that it appreared patient was taking both candasartan 16mg  and valsartan 160mg  1 tablet qam and 1/2 tablet qpm. We was and he stopped candasartan 16mg  on Tuesday, November 14th.  He did not realize that candasartan and Atacand were the same medication.  Review of Systems  Cardiovascular: Negative for chest pain, palpitations, orthopnea and leg swelling.  Neurological: Negative for dizziness.     Current Outpatient Prescriptions:  .  aspirin 81 MG EC tablet, Take 81 mg by mouth daily.  , Disp: , Rfl:  .  atorvastatin (LIPITOR) 20 MG tablet, TAKE 1 TABLET DAILY AS DIRECTED (MUST BE SEEN BEFORE NEXT REFILL), Disp: 90 tablet, Rfl: 1 .  bimatoprost (LUMIGAN) 0.01 % SOLN, 1 drop at bedtime., Disp: , Rfl:   - candasartan 16mg  - take 1 tablet daily (just stopped Tuesday 09/18/16 at our request) .  Cholecalciferol (VITAMIN D3) 5000 units CAPS, Take 1 capsule by mouth 3 (three) times a week., Disp: , Rfl:  .  Choline Fenofibrate (FENOFIBRIC ACID) 135 MG CPDR, TAKE 1 CAPSULE DAILY, Disp: 90 capsule, Rfl: 0 .  FREESTYLE LITE test strip, USE TO TEST BLOOD SUGAR TWICE A DAY, Disp: 300 each, Rfl: 2 .  metFORMIN (GLUCOPHAGE) 500 MG tablet, Take 1 tablet (500 mg total) by mouth daily with breakfast., Disp: 90 tablet, Rfl: 3 .  omega-3 acid ethyl esters (LOVAZA) 1 g capsule, Take 2 capsules (2 g total) by mouth 2 (two) times daily., Disp: 360 capsule, Rfl: 3 .  OVER THE COUNTER MEDICATION, Allergy relief otc, nasal spray, fluticasone spray, Disp: , Rfl:  .  testosterone (ANDROGEL) 50 MG/5GM (1%) GEL, Place 5 g onto the skin  daily., Disp: 450 Tube, Rfl: 1 .  timolol (BETIMOL) 0.25 % ophthalmic solution, 1-2 drops 2 (two) times daily., Disp: , Rfl:  .  valsartan (DIOVAN) 160 MG tablet, Take 1 whole tab in the AM and 1/2 tab in the afternoon, Disp: 135 tablet, Rfl: 1  Objective:   Vitals:   09/21/16 1220  BP: 132/80  Pulse: 64      Assessment:    Hypertension, controlled - even with discontinuation of candasartan Hyperkalemia - likely related to taking 2 ARBs   Plan:    1.  Remain off candasartan 2.  Reviewed all other medications and update medication list.  3.  RTC in about 7 days to recheck BMET / potassium  Cherre Robins, PharmD, CPP

## 2016-10-04 ENCOUNTER — Other Ambulatory Visit

## 2016-10-04 DIAGNOSIS — E875 Hyperkalemia: Secondary | ICD-10-CM

## 2016-10-04 LAB — BMP8+EGFR
BUN / CREAT RATIO: 20 (ref 10–24)
BUN: 23 mg/dL (ref 8–27)
CHLORIDE: 99 mmol/L (ref 96–106)
CO2: 25 mmol/L (ref 18–29)
Calcium: 10 mg/dL (ref 8.6–10.2)
Creatinine, Ser: 1.16 mg/dL (ref 0.76–1.27)
GFR, EST AFRICAN AMERICAN: 79 mL/min/{1.73_m2} (ref >59)
GFR, EST NON AFRICAN AMERICAN: 68 mL/min/{1.73_m2} (ref >59)
Glucose: 155 mg/dL — ABNORMAL HIGH (ref 65–99)
POTASSIUM: 4.7 mmol/L (ref 3.5–5.2)
SODIUM: 143 mmol/L (ref 134–144)

## 2016-10-05 ENCOUNTER — Telehealth: Payer: Self-pay | Admitting: Family Medicine

## 2016-10-05 NOTE — Telephone Encounter (Signed)
NA

## 2016-10-19 ENCOUNTER — Ambulatory Visit (INDEPENDENT_AMBULATORY_CARE_PROVIDER_SITE_OTHER): Admitting: Physician Assistant

## 2016-10-19 ENCOUNTER — Encounter: Payer: Self-pay | Admitting: Physician Assistant

## 2016-10-19 ENCOUNTER — Other Ambulatory Visit: Payer: Self-pay | Admitting: Family Medicine

## 2016-10-19 VITALS — BP 132/89 | HR 56 | Temp 97.0°F | Ht 69.0 in | Wt 182.0 lb

## 2016-10-19 DIAGNOSIS — H60391 Other infective otitis externa, right ear: Secondary | ICD-10-CM

## 2016-10-19 MED ORDER — NEOMYCIN-POLYMYXIN-HC 1 % OT SOLN: 3.0000 [drp] | Freq: Three times a day (TID) | OTIC | 0 refills | Status: DC

## 2016-10-19 NOTE — Progress Notes (Signed)
BP 132/89   Pulse (!) 56   Temp 97 F (36.1 C) (Oral)   Ht 5\' 9"  (1.753 m)   Wt 182 lb (82.6 kg)   BMI 26.88 kg/m    Subjective:    Patient ID: Terry [name], male    DOB: May 04, 1956, 60 y.o.   MRN: [id number]  HPI: Terry [name] is a 60 y.o. male presenting on 10/19/2016 for Ear Pain (right )  One day ago start with right ear pain and worsened through day. Had ibuprofen that helped minimally. Slight bit of drainage when he right ear. It hurts to lay on that side. He denies any fever or chills. No other URI symptoms.  Relevant past medical, surgical, family and social history reviewed and updated as indicated. Allergies and medications reviewed and updated.  Past Medical History:  Diagnosis Date  . Elevated blood sugar   . Esophageal reflux   . Essential hypertension, benign   . Hyperplasia of prostate   . Other and unspecified hyperlipidemia   . Other testicular hypofunction   . Unspecified glaucoma(365.9)   . Unspecified hemorrhoids without mention of complication     Past Surgical History:  Procedure Laterality Date  . LIPOMA EXCISION Right 2000   shoulder    Review of Systems  Constitutional: Negative.  Negative for appetite change, fatigue and fever.  HENT: Positive for ear discharge and ear pain.   Eyes: Negative for pain and visual disturbance.  Respiratory: Negative.  Negative for cough, chest tightness, shortness of breath and wheezing.   Cardiovascular: Negative.  Negative for chest pain, palpitations and leg swelling.  Gastrointestinal: Negative.  Negative for abdominal pain, diarrhea, nausea and vomiting.  Genitourinary: Negative.   Skin: Negative.  Negative for color change and rash.  Neurological: Negative.  Negative for weakness, numbness and headaches.  Psychiatric/Behavioral: Negative.     Allergies as of 10/19/2016      Reactions   Penicillins       Medication List       Accurate as of 10/19/16  9:15 AM. Always use your most recent med  list.          aspirin 81 MG EC tablet Take 81 mg by mouth daily.   atorvastatin 20 MG tablet Commonly known as:  LIPITOR TAKE 1 TABLET DAILY AS DIRECTED (MUST BE SEEN BEFORE NEXT REFILL)   bimatoprost 0.01 % Soln Commonly known as:  LUMIGAN 1 drop at bedtime.   Fenofibric Acid 135 MG Cpdr TAKE 1 CAPSULE DAILY   FREESTYLE LITE test strip Generic drug:  glucose blood USE TO TEST BLOOD SUGAR TWICE A DAY   metFORMIN 500 MG tablet Commonly known as:  GLUCOPHAGE Take 1 tablet (500 mg total) by mouth daily with breakfast.   NEOMYCIN-POLYMYXIN-HYDROCORTISONE 1 % Soln otic solution Commonly known as:  CORTISPORIN Place 3 drops into the right ear every 8 (eight) hours.   omega-3 acid ethyl esters 1 g capsule Commonly known as:  LOVAZA Take 2 capsules (2 g total) by mouth 2 (two) times daily.   OVER THE COUNTER MEDICATION Allergy relief otc, nasal spray, fluticasone spray   testosterone 50 MG/5GM (1%) Gel Commonly known as:  ANDROGEL Place 5 g onto the skin daily.   timolol 0.25 % ophthalmic solution Commonly known as:  BETIMOL 1-2 drops 2 (two) times daily.   valsartan 160 MG tablet Commonly known as:  DIOVAN Take 1 whole tab in the AM and 1/2 tab in the afternoon   Vitamin D3 5000  units Caps Take 1 capsule by mouth 3 (three) times a week.          Objective:    BP 132/89   Pulse (!) 56   Temp 97 F (36.1 C) (Oral)   Ht 5\' 9"  (1.753 m)   Wt 182 lb (82.6 kg)   BMI 26.88 kg/m   Allergies  Allergen Reactions  . Penicillins     Physical Exam  Constitutional: He appears well-developed and well-nourished. No distress.  HENT:  Head: Normocephalic and atraumatic.  Right Ear: Hearing and tympanic membrane normal. There is drainage.  Eyes: Conjunctivae and EOM are normal. Pupils are equal, round, and reactive to light.  Cardiovascular: Normal rate, regular rhythm and normal heart sounds.   Pulmonary/Chest: Effort normal and breath sounds normal. No  respiratory distress.  Skin: Skin is warm and dry.  Psychiatric: He has a normal mood and affect. His behavior is normal.  Nursing note and vitals reviewed.       Assessment & Plan:   1. Infective otitis externa of right ear - NEOMYCIN-POLYMYXIN-HYDROCORTISONE (CORTISPORIN) 1 % SOLN otic solution; Place 3 drops into the right ear every 8 (eight) hours.  Dispense: 10 mL; Refill: 0   Continue all other maintenance medications as listed above.  Follow up plan: Follow up as needed.  Educational handout given for otitis externa  Terald Sleeper PA-C [place] [address]  [place], [place] [address] [phone]   10/19/2016, 9:15 AM

## 2016-10-19 NOTE — Patient Instructions (Signed)
Otitis Externa Otitis externa is a germ infection in the outer ear. The outer ear is the area from the eardrum to the outside of the ear. Otitis externa is sometimes called "swimmer's ear." HOME CARE  Put drops in the ear as told by your doctor.  Only take medicine as told by your doctor.  If you have diabetes, your doctor may give you more directions. Follow your doctor's directions.  Keep all doctor visits as told. To avoid another infection:  Keep your ear dry. Use the corner of a towel to dry your ear after swimming or bathing.  Avoid scratching or putting things inside your ear.  Avoid swimming in lakes, dirty water, or pools that use a chemical called chlorine poorly.  You may use ear drops after swimming. Combine equal amounts of white vinegar and alcohol in a bottle. Put 3 or 4 drops in each ear. GET HELP IF:   You have a fever.  Your ear is still red, puffy (swollen), or painful after 3 days.  You still have yellowish-white fluid (pus) coming from the ear after 3 days.  Your redness, puffiness, or pain gets worse.  You have a really bad headache.  You have redness, puffiness, pain, or tenderness behind your ear. MAKE SURE YOU:   Understand these instructions.  Will watch your condition.  Will get help right away if you are not doing well or get worse. This information is not intended to replace advice given to you by your health care provider. Make sure you discuss any questions you have with your health care provider. Document Released: 04/09/2008 Document Revised: 11/12/2014 Document Reviewed: 08/01/2015 Elsevier Interactive Patient Education  2017 [employer]  

## 2016-10-25 ENCOUNTER — Encounter: Payer: Self-pay | Admitting: *Deleted

## 2016-11-20 ENCOUNTER — Other Ambulatory Visit: Payer: Self-pay | Admitting: *Deleted

## 2017-01-01 ENCOUNTER — Ambulatory Visit: Admitting: Family Medicine

## 2017-01-11 ENCOUNTER — Ambulatory Visit (INDEPENDENT_AMBULATORY_CARE_PROVIDER_SITE_OTHER): Admitting: Family Medicine

## 2017-01-11 ENCOUNTER — Encounter: Payer: Self-pay | Admitting: Family Medicine

## 2017-01-11 VITALS — BP 118/81 | HR 78 | Temp 97.3°F | Ht 69.0 in | Wt 175.0 lb

## 2017-01-11 DIAGNOSIS — E349 Endocrine disorder, unspecified: Secondary | ICD-10-CM | POA: Diagnosis not present

## 2017-01-11 DIAGNOSIS — Z23 Encounter for immunization: Secondary | ICD-10-CM | POA: Diagnosis not present

## 2017-01-11 DIAGNOSIS — E559 Vitamin D deficiency, unspecified: Secondary | ICD-10-CM

## 2017-01-11 DIAGNOSIS — E785 Hyperlipidemia, unspecified: Secondary | ICD-10-CM

## 2017-01-11 DIAGNOSIS — E119 Type 2 diabetes mellitus without complications: Secondary | ICD-10-CM

## 2017-01-11 DIAGNOSIS — I10 Essential (primary) hypertension: Secondary | ICD-10-CM

## 2017-01-11 DIAGNOSIS — E1169 Type 2 diabetes mellitus with other specified complication: Secondary | ICD-10-CM | POA: Diagnosis not present

## 2017-01-11 LAB — BAYER DCA HB A1C WAIVED: HB A1C (BAYER DCA - WAIVED): 6.5 % (ref <7.0)

## 2017-01-11 NOTE — Addendum Note (Signed)
Addended by: Zannie Cove on: 01/11/2017 02:58 PM   Modules accepted: Orders

## 2017-01-11 NOTE — Progress Notes (Signed)
Subjective:    Patient ID: Terry [name], male    DOB: June 28, 1956, 61 y.o.   MRN: [id number]  HPI Pt here for follow up and management of chronic medical problems which includes diabetes, hypertension and hyperlipidemia. He is taking medication regularly.The patient is doing well today with no specific complaints. He does not need any refills. He does have diabetes hyperlipidemia and low testosterone levels. He is going to get his shingles shot today. He brings in blood pressures for review since December and the majority of the's are running in the upper 130s and sometimes occasionally up to as high as 160. The reading today in the office is good and we will not make any changes based on the home readings. His blood sugar readings are running in the 1:30 to 150 range fasting and much slower during the day. The most recent blood sugars are running averaging in the 1:30 range fasting. His readings will be scanned into the record. The patient says he's been under a lot of family stress recently. He has a mother that lives in [place] that is having periodic transfusions because of a GI bleed and she is 47 years old. Had a cousin recently that died from alcohol abuse because of dealing with PTSD. He personally denies any chest pain or shortness of breath. He's passing his water without problems and his sexual function is good. He denies any shortness of breath. He denies any problems with his stomach including nausea vomiting diarrhea blood in the stool or black tarry bowel movements. His weight today is down 7 pounds from the previous weight in December he is to be applauded for this.     Patient Active Problem List   Diagnosis Date Noted  . Seasonal and perennial allergic rhinitis 05/21/2016  . Obstructive sleep apnea 06/05/2015  . Esophageal reflux   . Elevated blood sugar   . BPH (benign prostatic hyperplasia)   . Unspecified hemorrhoids without mention of complication   . Testosterone  deficiency   . Hyperlipidemia associated with type 2 diabetes mellitus ([place])   . Essential hypertension, benign   . Unspecified glaucoma(365.9)    Outpatient Encounter Prescriptions as of 01/11/2017  Medication Sig  . aspirin 81 MG EC tablet Take 81 mg by mouth daily.    Marland Kitchen atorvastatin (LIPITOR) 20 MG tablet TAKE 1 TABLET DAILY AS DIRECTED (MUST BE SEEN BEFORE NEXT REFILL)  . bimatoprost (LUMIGAN) 0.01 % SOLN 1 drop at bedtime.  . Cholecalciferol (VITAMIN D3) 5000 units CAPS Take 1 capsule by mouth 3 (three) times a week.  . Choline Fenofibrate (FENOFIBRIC ACID) 135 MG CPDR TAKE 1 CAPSULE DAILY  . FREESTYLE LITE test strip USE TO TEST BLOOD SUGAR TWICE A DAY  . metFORMIN (GLUCOPHAGE) 500 MG tablet Take 1 tablet (500 mg total) by mouth daily with breakfast.  . NEOMYCIN-POLYMYXIN-HYDROCORTISONE (CORTISPORIN) 1 % SOLN otic solution Place 3 drops into the right ear every 8 (eight) hours.  Marland Kitchen omega-3 acid ethyl esters (LOVAZA) 1 g capsule Take 2 capsules (2 g total) by mouth 2 (two) times daily.  Marland Kitchen OVER THE COUNTER MEDICATION Allergy relief otc, nasal spray, fluticasone spray  . testosterone (ANDROGEL) 50 MG/5GM (1%) GEL Place 5 g onto the skin daily.  . timolol (BETIMOL) 0.25 % ophthalmic solution 1-2 drops 2 (two) times daily.  . valsartan (DIOVAN) 160 MG tablet Take 1 whole tab in the AM and 1/2 tab in the afternoon   No facility-administered encounter medications on file as of  01/11/2017.      Review of Systems  Constitutional: Negative.   HENT: Negative.   Eyes: Negative.   Respiratory: Negative.   Cardiovascular: Negative.   Gastrointestinal: Negative.   Endocrine: Negative.   Genitourinary: Negative.   Musculoskeletal: Negative.   Skin: Negative.   Allergic/Immunologic: Negative.   Neurological: Negative.   Hematological: Negative.   Psychiatric/Behavioral: Negative.        Objective:   Physical Exam  Constitutional: He is oriented to person, place, and time. He appears  well-developed and well-nourished. No distress.  Pleasant and alert  HENT:  Head: Normocephalic and atraumatic.  Right Ear: External ear normal.  Left Ear: External ear normal.  Nose: Nose normal.  Mouth/Throat: Oropharynx is clear and moist. No oropharyngeal exudate.  Eyes: Conjunctivae and EOM are normal. Pupils are equal, round, and reactive to light. Right eye exhibits no discharge. Left eye exhibits no discharge. No scleral icterus.  Neck: Normal range of motion. Neck supple. No thyromegaly present.  No bruits thyromegaly or anterior cervical adenopathy  Cardiovascular: Normal rate, regular rhythm, normal heart sounds and intact distal pulses.   No murmur heard. The heart has a regular rate and rhythm at 72/m  Pulmonary/Chest: Effort normal and breath sounds normal. No respiratory distress. He has no wheezes. He has no rales. He exhibits no tenderness.  Lungs are clear anteriorly and posteriorly and there is no axillary adenopathy  Abdominal: Soft. Bowel sounds are normal. He exhibits no mass. There is no tenderness. There is no rebound and no guarding.  No abdominal tenderness masses or organ enlargement. No inguinal adenopathy.  Musculoskeletal: Normal range of motion. He exhibits no edema.  Lymphadenopathy:    He has no cervical adenopathy.  Neurological: He is alert and oriented to person, place, and time. He has normal reflexes. No cranial nerve deficit.  Skin: Skin is warm and dry. No rash noted.  Psychiatric: He has a normal mood and affect. His behavior is normal. Judgment and thought content normal.  Nursing note and vitals reviewed.  BP 118/81 (BP Location: Left Arm)   Pulse 78   Temp 97.3 F (36.3 C) (Oral)   Ht 5' 9" (1.753 m)   Wt 175 lb (79.4 kg)   BMI 25.84 kg/m         Assessment & Plan:  1. Essential hypertension, benign -The blood pressure is good today with a reading. Some of his readings are actually higher but we will make no change in his current  treatment especially since he is working with his diet and losing weight. - BMP8+EGFR - CBC with Differential/Platelet - Hepatic function panel  2. Testosterone deficiency -The patient is continuing to use testosterone replacement and is doing well with this and has no complaints about this at this time. - CBC with Differential/Platelet  3. Hyperlipidemia associated with type 2 diabetes mellitus ([place]) -He will continue with current cholesterol treatment pending results of lab work - CBC with Differential/Platelet - Lipid panel  4. Vitamin D deficiency -Continue with vitamin D replacement pending results of lab work - CBC with Differential/Platelet - VITAMIN D 25 Hydroxy (Vit-D Deficiency, Fractures)  5. Type 2 diabetes mellitus without complication, without long-term current use of insulin ([facility]) -Home blood sugar readings are running higher in the morning than during the day. We will see what his A1c is and make any adjustments based on that. - CBC with Differential/Platelet - Bayer DCA Hb A1c Waived  Patient Instructions  Continue current medications. Continue good  therapeutic lifestyle changes which include good diet and exercise. Fall precautions discussed with patient. If an FOBT was given today- please return it to our front desk. If you are over 26 years old - you may need Prevnar 47 or the adult Pneumonia vaccine.  **Flu shots are available--- please call and schedule a FLU-CLINIC appointment**  After your visit with [place] today you will receive a survey in the mail or online from [employer] regarding your care with [place]. Please take a moment to fill this out. Your feedback is very important to [place] as you can help [place] better understand your patient needs as well as improve your experience and satisfaction. WE CARE ABOUT YOU!!!   Continue with aggressive therapeutic lifestyle changes We will keep you in our prayers regarding her mom's situation. You will receive the shingles shot  today and you will not need another one. Sometimes the arm will be sore after the shingles shot. Continue to monitor blood pressures at home and watch sodium intake  Arrie Senate MD

## 2017-01-11 NOTE — Patient Instructions (Addendum)
Continue current medications. Continue good therapeutic lifestyle changes which include good diet and exercise. Fall precautions discussed with patient. If an FOBT was given today- please return it to our front desk. If you are over 61 years old - you may need Prevnar 82 or the adult Pneumonia vaccine.  **Flu shots are available--- please call and schedule a FLU-CLINIC appointment**  After your visit with [place] today you will receive a survey in the mail or online from [employer] regarding your care with [place]. Please take a moment to fill this out. Your feedback is very important to [place] as you can help [place] better understand your patient needs as well as improve your experience and satisfaction. WE CARE ABOUT YOU!!!   Continue with aggressive therapeutic lifestyle changes We will keep you in our prayers regarding her mom's situation. You will receive the shingles shot today and you will not need another one. Sometimes the arm will be sore after the shingles shot. Continue to monitor blood pressures at home and watch sodium intake

## 2017-01-12 LAB — CBC WITH DIFFERENTIAL/PLATELET
BASOS: 0 %
Basophils Absolute: 0 10*3/uL (ref 0.0–0.2)
EOS (ABSOLUTE): 0.1 10*3/uL (ref 0.0–0.4)
EOS: 2 %
HEMATOCRIT: 43.9 % (ref 37.5–51.0)
Hemoglobin: 15.2 g/dL (ref 13.0–17.7)
IMMATURE GRANS (ABS): 0 10*3/uL (ref 0.0–0.1)
IMMATURE GRANULOCYTES: 0 %
LYMPHS: 50 %
Lymphocytes Absolute: 3.6 10*3/uL — ABNORMAL HIGH (ref 0.7–3.1)
MCH: 30.7 pg (ref 26.6–33.0)
MCHC: 34.6 g/dL (ref 31.5–35.7)
MCV: 89 fL (ref 79–97)
MONOS ABS: 0.6 10*3/uL (ref 0.1–0.9)
Monocytes: 8 %
NEUTROS ABS: 2.9 10*3/uL (ref 1.4–7.0)
NEUTROS PCT: 40 %
Platelets: 298 10*3/uL (ref 150–379)
RBC: 4.95 x10E6/uL (ref 4.14–5.80)
RDW: 13.8 % (ref 12.3–15.4)
WBC: 7.1 10*3/uL (ref 3.4–10.8)

## 2017-01-12 LAB — HEPATIC FUNCTION PANEL
ALT: 46 IU/L — ABNORMAL HIGH (ref 0–44)
AST: 38 IU/L (ref 0–40)
Albumin: 5.3 g/dL — ABNORMAL HIGH (ref 3.6–4.8)
Alkaline Phosphatase: 42 IU/L (ref 39–117)
BILIRUBIN TOTAL: 0.5 mg/dL (ref 0.0–1.2)
BILIRUBIN, DIRECT: 0.17 mg/dL (ref 0.00–0.40)
Total Protein: 7.9 g/dL (ref 6.0–8.5)

## 2017-01-12 LAB — BMP8+EGFR
BUN/Creatinine Ratio: 19 (ref 10–24)
BUN: 21 mg/dL (ref 8–27)
CO2: 20 mmol/L (ref 18–29)
CREATININE: 1.11 mg/dL (ref 0.76–1.27)
Calcium: 10.4 mg/dL — ABNORMAL HIGH (ref 8.6–10.2)
Chloride: 99 mmol/L (ref 96–106)
GFR, EST AFRICAN AMERICAN: 83 mL/min/{1.73_m2} (ref >59)
GFR, EST NON AFRICAN AMERICAN: 72 mL/min/{1.73_m2} (ref >59)
Glucose: 122 mg/dL — ABNORMAL HIGH (ref 65–99)
Potassium: 5.5 mmol/L — ABNORMAL HIGH (ref 3.5–5.2)
SODIUM: 139 mmol/L (ref 134–144)

## 2017-01-12 LAB — LIPID PANEL
CHOLESTEROL TOTAL: 144 mg/dL (ref 100–199)
Chol/HDL Ratio: 2.7 ratio units (ref 0.0–5.0)
HDL: 54 mg/dL (ref >39)
LDL Calculated: 56 mg/dL (ref 0–99)
Triglycerides: 168 mg/dL — ABNORMAL HIGH (ref 0–149)
VLDL CHOLESTEROL CAL: 34 mg/dL (ref 5–40)

## 2017-01-12 LAB — VITAMIN D 25 HYDROXY (VIT D DEFICIENCY, FRACTURES): Vit D, 25-Hydroxy: 39.1 ng/mL (ref 30.0–100.0)

## 2017-01-14 NOTE — Addendum Note (Signed)
Addended by: Thana Ates on: 01/14/2017 10:15 AM   Modules accepted: Orders

## 2017-01-25 ENCOUNTER — Other Ambulatory Visit (INDEPENDENT_AMBULATORY_CARE_PROVIDER_SITE_OTHER)

## 2017-01-25 LAB — BMP8+EGFR
BUN/Creatinine Ratio: 21 (ref 10–24)
BUN: 24 mg/dL (ref 8–27)
CALCIUM: 10.1 mg/dL (ref 8.6–10.2)
CHLORIDE: 102 mmol/L (ref 96–106)
CO2: 25 mmol/L (ref 18–29)
Creatinine, Ser: 1.12 mg/dL (ref 0.76–1.27)
GFR calc Af Amer: 82 mL/min/{1.73_m2} (ref >59)
GFR, EST NON AFRICAN AMERICAN: 71 mL/min/{1.73_m2} (ref >59)
GLUCOSE: 154 mg/dL — AB (ref 65–99)
POTASSIUM: 5.4 mmol/L — AB (ref 3.5–5.2)
SODIUM: 142 mmol/L (ref 134–144)

## 2017-01-26 LAB — PARATHYROID HORMONE, INTACT (NO CA): PTH: 19 pg/mL (ref 15–65)

## 2017-02-13 ENCOUNTER — Other Ambulatory Visit: Payer: Self-pay | Admitting: Family Medicine

## 2017-03-03 ENCOUNTER — Other Ambulatory Visit: Payer: Self-pay | Admitting: Family Medicine

## 2017-03-16 ENCOUNTER — Other Ambulatory Visit: Payer: Self-pay | Admitting: Family Medicine

## 2017-05-04 ENCOUNTER — Other Ambulatory Visit: Payer: Self-pay | Admitting: Family Medicine

## 2017-05-09 ENCOUNTER — Telehealth: Payer: Self-pay | Admitting: Family Medicine

## 2017-05-09 DIAGNOSIS — E785 Hyperlipidemia, unspecified: Secondary | ICD-10-CM

## 2017-05-09 DIAGNOSIS — E1159 Type 2 diabetes mellitus with other circulatory complications: Secondary | ICD-10-CM

## 2017-05-09 DIAGNOSIS — I10 Essential (primary) hypertension: Principal | ICD-10-CM

## 2017-05-09 DIAGNOSIS — E1169 Type 2 diabetes mellitus with other specified complication: Secondary | ICD-10-CM

## 2017-05-09 NOTE — Telephone Encounter (Signed)
Patient aware that order for future labs will be placed in the chart so that he may come in for labwork before appointment.

## 2017-05-22 ENCOUNTER — Ambulatory Visit: Admitting: Family Medicine

## 2017-05-23 ENCOUNTER — Ambulatory Visit (INDEPENDENT_AMBULATORY_CARE_PROVIDER_SITE_OTHER): Admitting: Internal Medicine

## 2017-05-23 ENCOUNTER — Telehealth: Payer: Self-pay | Admitting: *Deleted

## 2017-05-23 ENCOUNTER — Telehealth: Payer: Self-pay | Admitting: Family Medicine

## 2017-05-23 ENCOUNTER — Encounter: Payer: Self-pay | Admitting: Internal Medicine

## 2017-05-23 VITALS — BP 122/78 | HR 52 | Ht 69.0 in | Wt 178.2 lb

## 2017-05-23 DIAGNOSIS — J302 Other seasonal allergic rhinitis: Secondary | ICD-10-CM | POA: Diagnosis not present

## 2017-05-23 DIAGNOSIS — J3089 Other allergic rhinitis: Secondary | ICD-10-CM

## 2017-05-23 DIAGNOSIS — G4733 Obstructive sleep apnea (adult) (pediatric): Secondary | ICD-10-CM | POA: Diagnosis not present

## 2017-05-23 MED ORDER — LOSARTAN POTASSIUM 100 MG PO TABS: 100.0000 mg | ORAL_TABLET | Freq: Every day | ORAL | 3 refills | Status: DC

## 2017-05-23 NOTE — Telephone Encounter (Signed)
Please review and advise.

## 2017-05-23 NOTE — Telephone Encounter (Signed)
Pt called regarding recall on Valsartan Per Dr Laurance Flatten change to Losartan RX sent into mail order per pt request

## 2017-05-23 NOTE — Progress Notes (Signed)
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 needs new supplies.  sometimes gets nasal congestion at night wearing CPAP.  He is very comfortable with this pressure. Compliance is adequate and control is good. He notices some nasal congestion especially in pollen season that sometimes interferes with CPAP use, but is not caused by CPAP. Uses occasional decongestant nasal spray and understands to watch out for overuse and rebound.  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 Pt wears CPAP nightly and pressure works well for patient. Pt will need new supplies-needs head strap ASAP. DL attached.  He gets up frequently at night and sometimes falls asleep as he puts mask back on, before he gets it looked up. We discussed compliance goals. Sometimes feels pressure isn't high enough. Download 63% compliance, AHI 1.2/hour. Pressure ranges between 5.7 and 8.8 which should be within the range of current settings. We discussed trying to move the whole range a little higher to see what happens.  ROS-see HPI   Negative unless "+" Constitutional:    weight loss, night sweats, fevers, chills, fatigue, lassitude. HEENT:    headaches, difficulty swallowing, tooth/dental problems, sore throat,       sneezing, itching, ear ache, +nasal congestion, post nasal drip, snoring CV:    chest pain, orthopnea, PND, swelling in lower extremities, anasarca,                                                      dizziness, palpitations Resp:   shortness of breath with exertion or at rest.                productive cough,   non-productive cough, coughing up of blood.              change in color of mucus.  wheezing.   Skin:    rash or lesions. GI:  No-   heartburn, indigestion, abdominal pain, nausea, vomiting, diarrhea,                 change in bowel habits, loss of appetite GU: dysuria, change in color of urine, no urgency or frequency.   flank pain. MS:   joint pain, stiffness, decreased range of motion, back pain. Neuro-     nothing unusual Psych:  change in mood or affect.  depression or anxiety.   memory loss.  OBJ- Physical Exam          stable baseline exam General- Alert, Oriented, Affect-appropriate, Distress- none acute, medium build Skin- rash-none, lesions- none, excoriation- none Lymphadenopathy- none Head- atraumatic            Eyes- Gross vision intact, PERRLA, conjunctivae and secretions clear            Ears- Hearing, canals-normal            Nose- Clear, Septal dev+, mucus, polyps, erosion, perforation             Throat- Mallampati IV ,  mucosa clear , drainage- none, tonsils- atrophic, own teeth Neck- flexible , trachea midline, no stridor , thyroid nl, carotid no bruit Chest - symmetrical excursion , unlabored           Heart/CV- RRR , no murmur , no gallop  , no rub, nl s1 s2                           - JVD- none , edema- none, stasis changes- none, varices- none           Lung- clear to P&A, wheeze- none, cough- none , dullness-none, rub- none           Chest wall-  Abd-  Br/ Gen/ Rectal- Not done, not indicated Extrem- cyanosis- none, clubbing, none, atrophy- none, strength- nl Neuro- grossly intact to observation

## 2017-05-23 NOTE — Assessment & Plan Note (Signed)
We will review compliance goals. He would like to try a little higher pressure so we are increasing the range to 8-20 to see how that feels.

## 2017-05-23 NOTE — Telephone Encounter (Signed)
Please have clinical pharmacist convert the patient to a different ARB with equivalent dose

## 2017-05-23 NOTE — Assessment & Plan Note (Signed)
Known deviated septum but not having increased rhinitis symptoms at this visit.

## 2017-05-23 NOTE — Patient Instructions (Signed)
Order- [facility]ME [facility]- please change CPAP auto range to 8-20. Continue mask of choice, humidifier, supplies, AirView      Dx OSA.  Patient needs new head strap and supplies now please.  Please call as needed

## 2017-05-24 ENCOUNTER — Other Ambulatory Visit: Payer: Self-pay | Admitting: Pharmacist

## 2017-05-24 NOTE — Telephone Encounter (Signed)
RX sent into pharamcy Okayed per Dr Laurance Flatten

## 2017-06-14 ENCOUNTER — Other Ambulatory Visit: Payer: Self-pay | Admitting: Family Medicine

## 2017-06-20 ENCOUNTER — Other Ambulatory Visit

## 2017-06-20 DIAGNOSIS — E119 Type 2 diabetes mellitus without complications: Secondary | ICD-10-CM

## 2017-06-20 DIAGNOSIS — E1169 Type 2 diabetes mellitus with other specified complication: Secondary | ICD-10-CM

## 2017-06-20 DIAGNOSIS — E785 Hyperlipidemia, unspecified: Principal | ICD-10-CM

## 2017-06-20 DIAGNOSIS — I10 Essential (primary) hypertension: Secondary | ICD-10-CM

## 2017-06-20 LAB — BAYER DCA HB A1C WAIVED: HB A1C (BAYER DCA - WAIVED): 6.2 % (ref <7.0)

## 2017-06-21 LAB — LIPID PANEL
CHOLESTEROL TOTAL: 133 mg/dL (ref 100–199)
Chol/HDL Ratio: 2.6 ratio (ref 0.0–5.0)
HDL: 51 mg/dL (ref >39)
LDL CALC: 61 mg/dL (ref 0–99)
TRIGLYCERIDES: 105 mg/dL (ref 0–149)
VLDL Cholesterol Cal: 21 mg/dL (ref 5–40)

## 2017-06-21 LAB — CBC WITH DIFFERENTIAL/PLATELET
BASOS: 0 %
Basophils Absolute: 0 10*3/uL (ref 0.0–0.2)
EOS (ABSOLUTE): 0.1 10*3/uL (ref 0.0–0.4)
Eos: 1 %
Hematocrit: 42.4 % (ref 37.5–51.0)
Hemoglobin: 14.2 g/dL (ref 13.0–17.7)
Immature Grans (Abs): 0 10*3/uL (ref 0.0–0.1)
Immature Granulocytes: 0 %
Lymphocytes Absolute: 2.7 10*3/uL (ref 0.7–3.1)
Lymphs: 47 %
MCH: 30 pg (ref 26.6–33.0)
MCHC: 33.5 g/dL (ref 31.5–35.7)
MCV: 90 fL (ref 79–97)
MONOS ABS: 0.4 10*3/uL (ref 0.1–0.9)
Monocytes: 8 %
NEUTROS ABS: 2.6 10*3/uL (ref 1.4–7.0)
NEUTROS PCT: 44 %
PLATELETS: 296 10*3/uL (ref 150–379)
RBC: 4.73 x10E6/uL (ref 4.14–5.80)
RDW: 13.7 % (ref 12.3–15.4)
WBC: 5.8 10*3/uL (ref 3.4–10.8)

## 2017-06-21 LAB — BMP8+EGFR
BUN/Creatinine Ratio: 19 (ref 10–24)
BUN: 19 mg/dL (ref 8–27)
CO2: 20 mmol/L (ref 20–29)
Calcium: 10 mg/dL (ref 8.6–10.2)
Chloride: 104 mmol/L (ref 96–106)
Creatinine, Ser: 1.01 mg/dL (ref 0.76–1.27)
GFR, EST AFRICAN AMERICAN: 92 mL/min/{1.73_m2} (ref >59)
GFR, EST NON AFRICAN AMERICAN: 80 mL/min/{1.73_m2} (ref >59)
Glucose: 122 mg/dL — ABNORMAL HIGH (ref 65–99)
Potassium: 4.8 mmol/L (ref 3.5–5.2)
SODIUM: 140 mmol/L (ref 134–144)

## 2017-06-26 ENCOUNTER — Encounter: Payer: Self-pay | Admitting: Family Medicine

## 2017-06-26 ENCOUNTER — Ambulatory Visit (INDEPENDENT_AMBULATORY_CARE_PROVIDER_SITE_OTHER): Admitting: Family Medicine

## 2017-06-26 VITALS — BP 119/81 | HR 99 | Temp 97.4°F | Ht 69.0 in | Wt 175.8 lb

## 2017-06-26 DIAGNOSIS — E559 Vitamin D deficiency, unspecified: Secondary | ICD-10-CM

## 2017-06-26 DIAGNOSIS — E785 Hyperlipidemia, unspecified: Secondary | ICD-10-CM

## 2017-06-26 DIAGNOSIS — E1169 Type 2 diabetes mellitus with other specified complication: Secondary | ICD-10-CM

## 2017-06-26 DIAGNOSIS — I10 Essential (primary) hypertension: Secondary | ICD-10-CM | POA: Diagnosis not present

## 2017-06-26 DIAGNOSIS — E119 Type 2 diabetes mellitus without complications: Secondary | ICD-10-CM | POA: Diagnosis not present

## 2017-06-26 DIAGNOSIS — E1159 Type 2 diabetes mellitus with other circulatory complications: Secondary | ICD-10-CM

## 2017-06-26 DIAGNOSIS — E349 Endocrine disorder, unspecified: Secondary | ICD-10-CM

## 2017-06-26 MED ORDER — TESTOSTERONE 50 MG/5GM (1%) TD GEL: 5.0000 g | Freq: Every day | TRANSDERMAL | 1 refills | Status: DC

## 2017-06-26 NOTE — Patient Instructions (Signed)
We have added a testosterone level and PSA to your blood work is reluctant to try to check this about every 6 months. We will do a rectal exam at your next visit as long as the PSA test and testosterone levels are good. Continue to exercise and walk regularly Continue to watch the diet closely and drink plenty of water and fluids and stay well hydrated Return the FOBT See the eye doctor regularly as you have been doing every July You will be due to have a colonoscopy next spring or in April 2019. If he did not get a call from the gastroenterologist please call us and we will make sure that this gets arrange for you.

## 2017-06-26 NOTE — Addendum Note (Signed)
Addended by: Zannie Cove on: 06/26/2017 03:45 PM   Modules accepted: Orders

## 2017-06-26 NOTE — Progress Notes (Signed)
Subjective:    Patient ID: Terry [name], male    DOB: [birth year], 61 y.o.   MRN: [id number]  HPI Patient is here today for a 4 month follow up on his HTN, hyperlipidemia, and diabetes.This patient brings in blood sugars and blood pressures for review today. The majority of the blood sugars fasting are running in about the 140-160 range. During the day they are all less than 130. The more recent blood sugars fasting in the morning are running in the 120 to 1:30 range maybe in the 140 range. Blood pressures at home are anywhere from 120 over the 80s to as high as 150 over the upper 80s. Initial blood pressure reading here today was excellent at 119/81. The patient has had his lab work done and this will be reviewed with him during the visit. Office cholesterol numbers were excellent with an LDL C being 61 and triglycerides being 105. The good cholesterol remains good. The CBC had a normal white blood cell count a good hemoglobin at 14.2 and an adequate platelet count. The BMP had an elevated blood sugar at 122 a good creatinine at 1.01 and normal electrolytes including potassium. Hemoglobin A1c was 6.2%. The patient is applauded for bringing his weight down and getting even better handles on his blood sugar control. Patient is up-to-date on his eye exams. He denies any chest pain shortness of breath trouble swallowing heartburn indigestion nausea vomiting diarrhea or change in bowel habits. He's passing his water without problems. He says that he notices at home that his blood pressures very from morning until evening.    Patient Active Problem List   Diagnosis Date Noted  . Seasonal and perennial allergic rhinitis 05/21/2016  . Obstructive sleep apnea 06/05/2015  . Esophageal reflux   . Elevated blood sugar   . BPH (benign prostatic hyperplasia)   . Unspecified hemorrhoids without mention of complication   . Testosterone deficiency   . Unspecified glaucoma(365.9)    Outpatient Encounter  Prescriptions as of 06/26/2017  Medication Sig  . aspirin 81 MG EC tablet Take 81 mg by mouth daily.    Marland Kitchen atorvastatin (LIPITOR) 20 MG tablet TAKE 1 TABLET DAILY AS DIRECTED (MUST BE SEEN BEFORE NEXT REFILL)  . bimatoprost (LUMIGAN) 0.01 % SOLN 1 drop at bedtime.  . Cholecalciferol (VITAMIN D3) 5000 units CAPS Take 1 capsule by mouth 3 (three) times a week.  . Choline Fenofibrate (FENOFIBRIC ACID) 135 MG CPDR TAKE 1 CAPSULE DAILY  . FREESTYLE LITE test strip USE TO TEST BLOOD SUGAR TWICE A DAY  . losartan (COZAAR) 100 MG tablet Take 1 tablet (100 mg total) by mouth daily.  . metFORMIN (GLUCOPHAGE) 500 MG tablet TAKE 1 TABLET DAILY WITH BREAKFAST  . omega-3 acid ethyl esters (LOVAZA) 1 g capsule Take 2 capsules (2 g total) by mouth 2 (two) times daily.  Marland Kitchen OVER THE COUNTER MEDICATION Allergy relief otc, nasal spray, fluticasone spray  . testosterone (ANDROGEL) 50 MG/5GM (1%) GEL Place 5 g onto the skin daily.  . timolol (BETIMOL) 0.25 % ophthalmic solution 1-2 drops 2 (two) times daily.   No facility-administered encounter medications on file as of 06/26/2017.       Review of Systems  Constitutional: Negative.   HENT: Negative.   Eyes: Negative.   Respiratory: Negative.   Cardiovascular: Negative.   Gastrointestinal: Negative.   Endocrine: Negative.   Genitourinary: Negative.   Musculoskeletal: Negative.   Skin: Negative.   Allergic/Immunologic: Negative.   Neurological: Negative.  Hematological: Negative.   Psychiatric/Behavioral: Negative.             Objective:   Physical Exam  Constitutional: He is oriented to person, place, and time. He appears well-developed and well-nourished. No distress.  The patient is pleasant and alert and in good spirits.  HENT:  Head: Normocephalic and atraumatic.  Right Ear: External ear normal.  Left Ear: External ear normal.  Mouth/Throat: Oropharynx is clear and moist. No oropharyngeal exudate.  Slight nasal congestion left more than  right  Eyes: Pupils are equal, round, and reactive to light. Conjunctivae and EOM are normal. Right eye exhibits no discharge. Left eye exhibits no discharge. No scleral icterus.  Neck: Normal range of motion. Neck supple. No thyromegaly present.  No bruits thyromegaly or anterior cervical adenopathy  Cardiovascular: Normal rate, regular rhythm, normal heart sounds and intact distal pulses.   No murmur heard. The heart has a regular rate and rhythm at 72/m  Pulmonary/Chest: Effort normal and breath sounds normal. No respiratory distress. He has no wheezes. He has no rales. He exhibits no tenderness.  Clear anteriorly and posteriorly and no axillary adenopathy  Abdominal: Soft. Bowel sounds are normal. He exhibits no mass. There is no tenderness. There is no rebound and no guarding.  No liver or spleen enlargement. No masses. No inguinal adenopathy.  Musculoskeletal: Normal range of motion. He exhibits no edema or tenderness.  Lymphadenopathy:    He has no cervical adenopathy.  Neurological: He is alert and oriented to person, place, and time. He has normal reflexes. No cranial nerve deficit.  Skin: Skin is warm and dry. No rash noted.  Psychiatric: He has a normal mood and affect. His behavior is normal. Judgment and thought content normal.  Nursing note and vitals reviewed.  BP 119/81   Pulse 99   Temp (!) 97.4 F (36.3 C) (Oral)   Ht 5\' 9"  (1.753 m)   Wt 175 lb 12.8 oz (79.7 kg)   BMI 25.96 kg/m         Assessment & Plan:  1. Hypertension associated with diabetes ([place]) -Repeat blood pressure was 136/88 in the right arm sitting with a regular cuff. He will continue with current treatment  2. Hyperlipidemia associated with type 2 diabetes mellitus ([place]) -Cholesterol numbers and blood sugar were great he will continue with current treatment  3. Essential hypertension, benign -Continue with current treatment and sodium restriction  4. Testosterone deficiency -Continue with  AndroGel and we have added a PSA and testosterone level to the blood work that was recently drawn.  5. Vitamin D deficiency -Continue with vitamin D replacement  6. Type 2 diabetes mellitus without complication, without long-term current use of insulin ([facility]) -Hemoglobin A1c was 6.2% he will continue with current treatment and therapeutic lifestyle changes  Meds ordered this encounter  Medications  . testosterone (ANDROGEL) 50 MG/5GM (1%) GEL    Sig: Place 5 g onto the skin daily.    Dispense:  450 g    Refill:  1   Patient Instructions  We have added a testosterone level and PSA to your blood work is reluctant to try to check this about every 6 months. We will do a rectal exam at your next visit as long as the PSA test and testosterone levels are good. Continue to exercise and walk regularly Continue to watch the diet closely and drink plenty of water and fluids and stay well hydrated Return the FOBT See the eye doctor regularly as you  have been doing every July You will be due to have a colonoscopy next spring or in April 2019. If he did not get a call from the gastroenterologist please call us and we will make sure that this gets arrange for you.  Arrie Senate MD

## 2017-06-27 LAB — URINALYSIS, COMPLETE
Bilirubin, UA: NEGATIVE
Glucose, UA: NEGATIVE
Ketones, UA: NEGATIVE
LEUKOCYTES UA: NEGATIVE
Nitrite, UA: NEGATIVE
PH UA: 7 (ref 5.0–7.5)
Protein, UA: NEGATIVE
RBC, UA: NEGATIVE
Specific Gravity, UA: 1.023 (ref 1.005–1.030)
UUROB: 0.2 mg/dL (ref 0.2–1.0)

## 2017-06-27 LAB — MICROSCOPIC EXAMINATION
BACTERIA UA: NONE SEEN
CASTS: NONE SEEN /LPF
EPITHELIAL CELLS (NON RENAL): NONE SEEN /HPF (ref 0–10)

## 2017-06-28 ENCOUNTER — Other Ambulatory Visit

## 2017-06-28 DIAGNOSIS — Z1211 Encounter for screening for malignant neoplasm of colon: Secondary | ICD-10-CM

## 2017-06-28 LAB — PSA, TOTAL AND FREE
PROSTATE SPECIFIC AG, SERUM: 1.8 ng/mL (ref 0.0–4.0)
PSA, Free Pct: 21.7 %
PSA, Free: 0.39 ng/mL

## 2017-06-28 LAB — SPECIMEN STATUS REPORT

## 2017-06-28 LAB — TESTOSTERONE,FREE AND TOTAL
TESTOSTERONE FREE: 17 pg/mL (ref 6.6–18.1)
Testosterone: 624 ng/dL (ref 264–916)

## 2017-07-02 LAB — FECAL OCCULT BLOOD, IMMUNOCHEMICAL: FECAL OCCULT BLD: NEGATIVE

## 2017-07-26 ENCOUNTER — Other Ambulatory Visit: Payer: Self-pay | Admitting: Family Medicine

## 2017-08-30 ENCOUNTER — Ambulatory Visit (INDEPENDENT_AMBULATORY_CARE_PROVIDER_SITE_OTHER): Admitting: *Deleted

## 2017-08-30 ENCOUNTER — Other Ambulatory Visit: Payer: Self-pay | Admitting: Family Medicine

## 2017-08-30 ENCOUNTER — Other Ambulatory Visit

## 2017-08-30 ENCOUNTER — Telehealth: Payer: Self-pay | Admitting: Family Medicine

## 2017-08-30 DIAGNOSIS — Z1159 Encounter for screening for other viral diseases: Secondary | ICD-10-CM

## 2017-08-30 DIAGNOSIS — Z23 Encounter for immunization: Secondary | ICD-10-CM

## 2017-08-30 NOTE — Telephone Encounter (Signed)
Patient aware that he may have hep c test done. Order placed.

## 2017-08-31 LAB — HEPATITIS C ANTIBODY

## 2017-10-18 ENCOUNTER — Other Ambulatory Visit

## 2017-10-18 DIAGNOSIS — E119 Type 2 diabetes mellitus without complications: Secondary | ICD-10-CM

## 2017-10-18 DIAGNOSIS — E349 Endocrine disorder, unspecified: Secondary | ICD-10-CM

## 2017-10-18 DIAGNOSIS — E785 Hyperlipidemia, unspecified: Secondary | ICD-10-CM

## 2017-10-18 DIAGNOSIS — E1169 Type 2 diabetes mellitus with other specified complication: Secondary | ICD-10-CM

## 2017-10-18 DIAGNOSIS — E559 Vitamin D deficiency, unspecified: Secondary | ICD-10-CM

## 2017-10-18 DIAGNOSIS — I10 Essential (primary) hypertension: Secondary | ICD-10-CM

## 2017-10-18 LAB — BAYER DCA HB A1C WAIVED: HB A1C: 6.9 % (ref <7.0)

## 2017-10-19 LAB — HEPATIC FUNCTION PANEL
ALK PHOS: 32 IU/L — AB (ref 39–117)
ALT: 47 IU/L — AB (ref 0–44)
AST: 31 IU/L (ref 0–40)
Albumin: 5.2 g/dL — ABNORMAL HIGH (ref 3.6–4.8)
BILIRUBIN TOTAL: 0.4 mg/dL (ref 0.0–1.2)
BILIRUBIN, DIRECT: 0.15 mg/dL (ref 0.00–0.40)
Total Protein: 7.3 g/dL (ref 6.0–8.5)

## 2017-10-19 LAB — BMP8+EGFR
BUN/Creatinine Ratio: 21 (ref 10–24)
BUN: 22 mg/dL (ref 8–27)
CALCIUM: 10.2 mg/dL (ref 8.6–10.2)
CHLORIDE: 101 mmol/L (ref 96–106)
CO2: 26 mmol/L (ref 20–29)
Creatinine, Ser: 1.03 mg/dL (ref 0.76–1.27)
GFR calc Af Amer: 90 mL/min/{1.73_m2} (ref >59)
GFR, EST NON AFRICAN AMERICAN: 78 mL/min/{1.73_m2} (ref >59)
GLUCOSE: 154 mg/dL — AB (ref 65–99)
POTASSIUM: 4 mmol/L (ref 3.5–5.2)
Sodium: 144 mmol/L (ref 134–144)

## 2017-10-19 LAB — CBC WITH DIFFERENTIAL/PLATELET
BASOS ABS: 0 10*3/uL (ref 0.0–0.2)
BASOS: 1 %
EOS (ABSOLUTE): 0.1 10*3/uL (ref 0.0–0.4)
Eos: 2 %
Hematocrit: 42.7 % (ref 37.5–51.0)
Hemoglobin: 14 g/dL (ref 13.0–17.7)
IMMATURE GRANS (ABS): 0 10*3/uL (ref 0.0–0.1)
IMMATURE GRANULOCYTES: 0 %
LYMPHS: 39 %
Lymphocytes Absolute: 2.8 10*3/uL (ref 0.7–3.1)
MCH: 30.6 pg (ref 26.6–33.0)
MCHC: 32.8 g/dL (ref 31.5–35.7)
MCV: 93 fL (ref 79–97)
MONOS ABS: 0.5 10*3/uL (ref 0.1–0.9)
Monocytes: 7 %
NEUTROS PCT: 51 %
Neutrophils Absolute: 3.8 10*3/uL (ref 1.4–7.0)
PLATELETS: 273 10*3/uL (ref 150–379)
RBC: 4.58 x10E6/uL (ref 4.14–5.80)
RDW: 14.5 % (ref 12.3–15.4)
WBC: 7.3 10*3/uL (ref 3.4–10.8)

## 2017-10-19 LAB — LIPID PANEL
CHOLESTEROL TOTAL: 151 mg/dL (ref 100–199)
Chol/HDL Ratio: 3.1 ratio (ref 0.0–5.0)
HDL: 49 mg/dL (ref >39)
LDL Calculated: 64 mg/dL (ref 0–99)
TRIGLYCERIDES: 192 mg/dL — AB (ref 0–149)
VLDL CHOLESTEROL CAL: 38 mg/dL (ref 5–40)

## 2017-10-19 LAB — VITAMIN D 25 HYDROXY (VIT D DEFICIENCY, FRACTURES): Vit D, 25-Hydroxy: 36.1 ng/mL (ref 30.0–100.0)

## 2017-10-21 ENCOUNTER — Ambulatory Visit (INDEPENDENT_AMBULATORY_CARE_PROVIDER_SITE_OTHER): Admitting: Family Medicine

## 2017-10-21 ENCOUNTER — Encounter: Payer: Self-pay | Admitting: Family Medicine

## 2017-10-21 VITALS — BP 135/77 | HR 79 | Temp 98.1°F | Ht 69.0 in | Wt 181.0 lb

## 2017-10-21 DIAGNOSIS — E785 Hyperlipidemia, unspecified: Secondary | ICD-10-CM | POA: Diagnosis not present

## 2017-10-21 DIAGNOSIS — E1169 Type 2 diabetes mellitus with other specified complication: Secondary | ICD-10-CM | POA: Diagnosis not present

## 2017-10-21 DIAGNOSIS — E349 Endocrine disorder, unspecified: Secondary | ICD-10-CM | POA: Diagnosis not present

## 2017-10-21 DIAGNOSIS — I10 Essential (primary) hypertension: Secondary | ICD-10-CM

## 2017-10-21 DIAGNOSIS — E119 Type 2 diabetes mellitus without complications: Secondary | ICD-10-CM

## 2017-10-21 DIAGNOSIS — E559 Vitamin D deficiency, unspecified: Secondary | ICD-10-CM | POA: Diagnosis not present

## 2017-10-21 MED ORDER — AMLODIPINE BESYLATE 2.5 MG PO TABS: 2.5000 mg | ORAL_TABLET | Freq: Every day | ORAL | 3 refills | Status: DC

## 2017-10-21 NOTE — Patient Instructions (Addendum)
Continue current medications. Continue good therapeutic lifestyle changes which include good diet and exercise. Fall precautions discussed with patient. If an FOBT was given today- please return it to our front desk. If you are over 61 years old - you may need Prevnar 65 or the adult Pneumonia vaccine.  **Flu shots are available--- please call and schedule a FLU-CLINIC appointment**  After your visit with [place] today you will receive a survey in the mail or online from [employer] regarding your care with [place]. Please take a moment to fill this out. Your feedback is very important to [place] as you can help [place] better understand your patient needs as well as improve your experience and satisfaction. WE CARE ABOUT YOU!!!   Watch sodium intake Make all efforts to lose 4 or 5 pounds of weight Eat less sodium Drink more water Take amlodipine 2-1/2 mg daily Bring blood pressure readings by in about 4 weeks and have the nurse check your blood pressure here and let us review the blood pressures at that time.  Bring your cuff so we can compare it to our readings also Make every effort to watch her blood sugar and reduce your caloric intake as much as possible Increase the vitamin D to 5000 units daily

## 2017-10-21 NOTE — Progress Notes (Signed)
Subjective:    Patient ID: Terry [name], male    DOB: [birth year], 61 y.o.   MRN: [id number]  HPI Pt here for follow up and management of chronic medical problems which includes diabetes, hypertension and hyperlipidemia. He is taking medication regularly.  Patient is doing well with no specific complaints.  He brings in blood sugars and blood pressures for review.  He also brings in weights.  The weights are staying between 170 and 175.  The blood sugars are running anywhere from the 118 level up to as high as 140 250 level.  This is mostly fasting.  The weights are stable.  Blood pressure is are running somewhat higher but more recently are better.  This patient has hyperlipidemia low vitamin D levels hypertension and diabetes as well as testosterone deficiency.  He is using AndroGel 1% gel.  The patient is dealing with a lot of stress this time a year and has had a lot of of his church members in the hospital and several deaths.  His weight is crept up also since Thanksgiving.  He has not been exercising as much.  He has an elliptical machine that he just received from his son he hopes to use this more and be more aggressive with its use.  I did review his blood work with him.  The triglycerides were elevated in the 190 range.  He had an albumin that was slightly increased as it has been before an alk phos that was low.  The blood sugar was also elevated with a normal creatinine and normal electrolytes.  The hemoglobin A1c was 6.9%.  This is higher than it has been been in the past.  The blood sugar control that is not is good and the elevated triglycerides are probably tied together and he understands the importance with exercising more regularly and watching his diet more closely.  His blood pressure readings have generally been higher than they should be especially with him having diabetes and we will most likely add amlodipine 2-1/2 mg to his current dose of losartan.     Patient Active Problem  List   Diagnosis Date Noted  . Seasonal and perennial allergic rhinitis 05/21/2016  . Obstructive sleep apnea 06/05/2015  . Esophageal reflux   . Elevated blood sugar   . BPH (benign prostatic hyperplasia)   . Unspecified hemorrhoids without mention of complication   . Testosterone deficiency   . Unspecified glaucoma(365.9)    Outpatient Encounter Medications as of 10/21/2017  Medication Sig  . aspirin 81 MG EC tablet Take 81 mg by mouth daily.    Marland Kitchen atorvastatin (LIPITOR) 20 MG tablet TAKE 1 TABLET DAILY AS DIRECTED (MUST BE SEEN BEFORE NEXT REFILL)  . bimatoprost (LUMIGAN) 0.01 % SOLN 1 drop at bedtime.  . Cholecalciferol (VITAMIN D3) 5000 units CAPS Take 1 capsule by mouth 3 (three) times a week.  . Choline Fenofibrate (FENOFIBRIC ACID) 135 MG CPDR TAKE 1 CAPSULE DAILY  . FREESTYLE LITE test strip USE TO TEST BLOOD SUGAR TWICE A DAY  . losartan (COZAAR) 100 MG tablet Take 1 tablet (100 mg total) by mouth daily.  . metFORMIN (GLUCOPHAGE) 500 MG tablet TAKE 1 TABLET DAILY WITH BREAKFAST  . omega-3 acid ethyl esters (LOVAZA) 1 g capsule TAKE 2 CAPSULES TWICE A DAY  . OVER THE COUNTER MEDICATION Allergy relief otc, nasal spray, fluticasone spray  . testosterone (ANDROGEL) 50 MG/5GM (1%) GEL Place 5 g onto the skin daily.  . timolol (BETIMOL) 0.25 %  ophthalmic solution Place 1 drop into both eyes 2 (two) times daily.    No facility-administered encounter medications on file as of 10/21/2017.      Review of Systems  Constitutional: Negative.   HENT: Negative.   Eyes: Negative.   Respiratory: Negative.   Cardiovascular: Negative.   Gastrointestinal: Negative.   Endocrine: Negative.   Genitourinary: Negative.   Musculoskeletal: Negative.   Skin: Negative.   Allergic/Immunologic: Negative.   Neurological: Negative.   Hematological: Negative.   Psychiatric/Behavioral: Negative.        Objective:   Physical Exam  Constitutional: He is oriented to person, place, and time. He  appears well-developed and well-nourished. No distress.  The patient is pleasant and may be slightly tense with all the stress he has been under recently  HENT:  Head: Normocephalic and atraumatic.  Right Ear: External ear normal.  Left Ear: External ear normal.  Mouth/Throat: Oropharynx is clear and moist. No oropharyngeal exudate.  Nasal congestion right greater than left, patient says he has been recovering from a sinus infection and has been using nasal saline for this with a Nettie pot and is doing better.  Eyes: Conjunctivae and EOM are normal. Pupils are equal, round, and reactive to light. Right eye exhibits no discharge. Left eye exhibits no discharge. No scleral icterus.  Neck: Normal range of motion. Neck supple. No thyromegaly present.  No bruits thyromegaly or anterior cervical  Cardiovascular: Normal rate, regular rhythm, normal heart sounds and intact distal pulses.  No murmur heard. Heart is regular at 84/min  Pulmonary/Chest: Effort normal and breath sounds normal. No respiratory distress. He has no wheezes. He has no rales. He exhibits no tenderness.  Lungs are clear anteriorly and posteriorly no axillary adenopathy and no chest wall masses  Abdominal: Soft. Bowel sounds are normal. He exhibits no mass. There is no tenderness. There is no rebound and no guarding.  Abdomen without liver or spleen enlargement tenderness or inguinal adenopathy or bruits.  Genitourinary: Rectum normal.  Musculoskeletal: Normal range of motion. He exhibits no edema.  Lymphadenopathy:    He has no cervical adenopathy.  Neurological: He is alert and oriented to person, place, and time. He has normal reflexes. No cranial nerve deficit.  Skin: Skin is warm and dry. No rash noted.  Psychiatric: He has a normal mood and affect. His behavior is normal. Judgment and thought content normal.  Nursing note and vitals reviewed.   BP 135/77 (BP Location: Left Arm)   Pulse 79   Temp 98.1 F (36.7 C)  (Oral)   Ht '5\' 9"'$  (1.753 m)   Wt 181 lb (82.1 kg)   BMI 26.73 kg/m      Assessment & Plan:  1. Essential hypertension, benign -Blood pressure overall is running more in the 140s than less than 921 systolic..  We will add amlodipine 2-1/2 mg to his current dose of losartan and continue to encourage weight loss sodium restriction and more exercise  2. Hyperlipidemia associated with type 2 diabetes mellitus ([place]) -Continue atorvastatin and aggressive therapeutic lifestyle changes only the triglyceride is elevated this time the LDL C is good.  3. Testosterone deficiency -Continue with current treatment  4. Vitamin D deficiency -Increase vitamin D to 5000 units daily  5. Type 2 diabetes mellitus without complication, without long-term current use of insulin ([place]) -Do better with therapeutic lifestyle changes including diet and exercise and weight loss  Meds ordered this encounter  Medications  . amLODipine (NORVASC) 2.5 MG tablet  Sig: Take 1 tablet (2.5 mg total) by mouth daily.    Dispense:  90 tablet    Refill:  3   Patient Instructions  Continue current medications. Continue good therapeutic lifestyle changes which include good diet and exercise. Fall precautions discussed with patient. If an FOBT was given today- please return it to our front desk. If you are over 71 years old - you may need Prevnar 53 or the adult Pneumonia vaccine.  **Flu shots are available--- please call and schedule a FLU-CLINIC appointment**  After your visit with [place] today you will receive a survey in the mail or online from [employer] regarding your care with [place]. Please take a moment to fill this out. Your feedback is very important to [place] as you can help [place] better understand your patient needs as well as improve your experience and satisfaction. WE CARE ABOUT YOU!!!   Watch sodium intake Make all efforts to lose 4 or 5 pounds of weight Eat less sodium Drink more water Take amlodipine 2-1/2 mg  daily Bring blood pressure readings by in about 4 weeks and have the nurse check your blood pressure here and let us review the blood pressures at that time.  Bring your cuff so we can compare it to our readings also Make every effort to watch her blood sugar and reduce your caloric intake as much as possible Increase the vitamin D to 5000 units daily  Arrie Senate MD

## 2017-11-05 HISTORY — PX: COLONOSCOPY: SHX174

## 2017-11-19 ENCOUNTER — Other Ambulatory Visit: Payer: Self-pay | Admitting: Family Medicine

## 2017-11-21 ENCOUNTER — Encounter (INDEPENDENT_AMBULATORY_CARE_PROVIDER_SITE_OTHER): Admitting: *Deleted

## 2017-11-28 ENCOUNTER — Other Ambulatory Visit: Payer: Self-pay | Admitting: Family Medicine

## 2017-12-11 ENCOUNTER — Telehealth: Payer: Self-pay | Admitting: Family Medicine

## 2017-12-11 ENCOUNTER — Other Ambulatory Visit: Payer: Self-pay | Admitting: *Deleted

## 2017-12-11 MED ORDER — AMLODIPINE BESYLATE 5 MG PO TABS: 5.0000 mg | ORAL_TABLET | Freq: Every day | ORAL | 3 refills | Status: DC

## 2017-12-11 NOTE — Telephone Encounter (Signed)
Patient brought notes by office that were given to provider.

## 2017-12-11 NOTE — Telephone Encounter (Signed)
Per BP records - we are changing Amlodipine from 2.5 mg to 5 mg daily - he will take this in the mornings  Losartan 100 will stay 1 a day in the PM. DWM reviewed and pt aware of changes / rx sent in for new strength

## 2018-01-13 ENCOUNTER — Telehealth: Payer: Self-pay | Admitting: *Deleted

## 2018-01-13 MED ORDER — HYDROCHLOROTHIAZIDE 12.5 MG PO CAPS: 12.5000 mg | ORAL_CAPSULE | Freq: Every day | ORAL | 3 refills | Status: DC

## 2018-01-13 NOTE — Telephone Encounter (Signed)
Pt called about his BP reading that were brought by (scanned in).  He is on Losartan 100 QD  And amlodipine 5 mg QD.  AM BP is too high - today we will add HCTZ 12.5 q am   Pt aware

## 2018-01-27 ENCOUNTER — Other Ambulatory Visit: Payer: Self-pay | Admitting: Family Medicine

## 2018-01-27 MED ORDER — TESTOSTERONE 50 MG/5GM (1%) TD GEL: 5.0000 g | Freq: Every day | TRANSDERMAL | 1 refills | Status: DC

## 2018-01-27 NOTE — Telephone Encounter (Signed)
What is the name of the medication? Testosterone 50 mg/5mg  1% gel  Have you contacted your pharmacy to request a refill? YES  Which pharmacy would you like this sent to? [facility]   Patient notified that their request is being sent to the clinical staff for review and that they should receive a call once it is complete. If they do not receive a call within 24 hours they can check with their pharmacy or our office.

## 2018-01-30 ENCOUNTER — Other Ambulatory Visit: Payer: Self-pay | Admitting: Family Medicine

## 2018-02-24 ENCOUNTER — Encounter: Payer: Self-pay | Admitting: Family Medicine

## 2018-02-24 ENCOUNTER — Ambulatory Visit (INDEPENDENT_AMBULATORY_CARE_PROVIDER_SITE_OTHER): Admitting: Family Medicine

## 2018-02-24 VITALS — BP 126/73 | HR 65 | Temp 96.6°F | Ht 69.0 in | Wt 172.0 lb

## 2018-02-24 DIAGNOSIS — E1169 Type 2 diabetes mellitus with other specified complication: Secondary | ICD-10-CM | POA: Diagnosis not present

## 2018-02-24 DIAGNOSIS — E119 Type 2 diabetes mellitus without complications: Secondary | ICD-10-CM

## 2018-02-24 DIAGNOSIS — E559 Vitamin D deficiency, unspecified: Secondary | ICD-10-CM

## 2018-02-24 DIAGNOSIS — E349 Endocrine disorder, unspecified: Secondary | ICD-10-CM | POA: Diagnosis not present

## 2018-02-24 DIAGNOSIS — Z1211 Encounter for screening for malignant neoplasm of colon: Secondary | ICD-10-CM | POA: Diagnosis not present

## 2018-02-24 DIAGNOSIS — H6123 Impacted cerumen, bilateral: Secondary | ICD-10-CM | POA: Diagnosis not present

## 2018-02-24 DIAGNOSIS — I10 Essential (primary) hypertension: Secondary | ICD-10-CM | POA: Diagnosis not present

## 2018-02-24 DIAGNOSIS — E785 Hyperlipidemia, unspecified: Secondary | ICD-10-CM

## 2018-02-24 DIAGNOSIS — M67431 Ganglion, right wrist: Secondary | ICD-10-CM | POA: Diagnosis not present

## 2018-02-24 LAB — MICROSCOPIC EXAMINATION
Epithelial Cells (non renal): NONE SEEN /hpf (ref 0–10)
Renal Epithel, UA: NONE SEEN /hpf

## 2018-02-24 LAB — URINALYSIS, COMPLETE
Bilirubin, UA: NEGATIVE
GLUCOSE, UA: NEGATIVE
KETONES UA: NEGATIVE
Leukocytes, UA: NEGATIVE
Nitrite, UA: NEGATIVE
PROTEIN UA: NEGATIVE
RBC, UA: NEGATIVE
SPEC GRAV UA: 1.02 (ref 1.005–1.030)
UUROB: 1 mg/dL (ref 0.2–1.0)
pH, UA: 7 (ref 5.0–7.5)

## 2018-02-24 MED ORDER — ICOSAPENT ETHYL 1 G PO CAPS: 2.0000 | ORAL_CAPSULE | Freq: Two times a day (BID) | ORAL | 3 refills | Status: DC

## 2018-02-24 NOTE — Patient Instructions (Addendum)
Continue current medications. Continue good therapeutic lifestyle changes which include good diet and exercise. Fall precautions discussed with patient. If an FOBT was given today- please return it to our front desk. If you are over 62 years old - you may need Prevnar 57 or the adult Pneumonia vaccine.  **Flu shots are available--- please call and schedule a FLU-CLINIC appointment**  After your visit with [place] today you will receive a survey in the mail or online from [employer] regarding your care with [place]. Please take a moment to fill this out. Your feedback is very important to [place] as you can help [place] better understand your patient needs as well as improve your experience and satisfaction. WE CARE ABOUT YOU!!!   Continue to exercise follow your diet and do as aggressive therapeutic lifestyle changes as possible Continue to monitor blood pressures and blood sugars as you have been doing We will send in a prescription for the vascepa and if insurance does not pay for this you should continue with fenofibrate and Lovaza. Return to the office for fasting lab work

## 2018-02-24 NOTE — Addendum Note (Signed)
Addended by: Zannie Cove on: 02/24/2018 10:48 AM   Modules accepted: Orders

## 2018-02-24 NOTE — Progress Notes (Signed)
Subjective:    Patient ID: Terry [name], male    DOB: [birth year], 62 y.o.   MRN: [id number]  HPI Pt here for follow up and management of chronic medical problems which includes diabetes, hyperlipidemia and hypertension. He is taking medication regularly.  The patient is doing well overall other than some complaints with arm pain.  He is due to have a rectal exam today because he is on testosterone replacement.  He will get a urine today and will return to the office for fasting lab work.  His vital signs are stable and his weight is down.  His blood pressure is 126/73.  He continues to use his AndroGel.  He is taking medicine for his blood pressure and his cholesterol as well as vitamin D replacement.  Assurance to see if it will pay for Vascepa.  The patient is pleasant and alert.  He denies any chest pain or shortness of breath.  He denies any trouble with his stomach including nausea vomiting diarrhea blood in the stool or black tarry bowel movements.  He is due to get a colonoscopy now.  We will work on this for him and he is agreeable to getting this.  He is passing his water well and having no problems with his sexual function.  He is in agreement to try vascepa if his insurance will pay for it.      Patient Active Problem List   Diagnosis Date Noted  . Seasonal and perennial allergic rhinitis 05/21/2016  . Obstructive sleep apnea 06/05/2015  . Esophageal reflux   . Elevated blood sugar   . BPH (benign prostatic hyperplasia)   . Unspecified hemorrhoids without mention of complication   . Testosterone deficiency   . Unspecified glaucoma(365.9)    Outpatient Encounter Medications as of 02/24/2018  Medication Sig  . amLODipine (NORVASC) 5 MG tablet Take 1 tablet (5 mg total) by mouth daily.  Marland Kitchen aspirin 81 MG EC tablet Take 81 mg by mouth daily.    Marland Kitchen atorvastatin (LIPITOR) 20 MG tablet TAKE 1 TABLET DAILY AS DIRECTED (MUST BE SEEN BEFORE NEXT REFILL)  . bimatoprost (LUMIGAN) 0.01 % SOLN  1 drop at bedtime.  . Cholecalciferol (VITAMIN D3) 5000 units CAPS Take 1 capsule by mouth 3 (three) times a week.  . Choline Fenofibrate (FENOFIBRIC ACID) 135 MG CPDR TAKE 1 CAPSULE DAILY  . FREESTYLE LITE test strip USE TO TEST BLOOD SUGAR TWICE A DAY  . hydrochlorothiazide (MICROZIDE) 12.5 MG capsule Take 1 capsule (12.5 mg total) by mouth daily.  Marland Kitchen losartan (COZAAR) 100 MG tablet Take 1 tablet (100 mg total) by mouth daily.  . metFORMIN (GLUCOPHAGE) 500 MG tablet TAKE 1 TABLET DAILY WITH BREAKFAST  . omega-3 acid ethyl esters (LOVAZA) 1 g capsule TAKE 2 CAPSULES TWICE A DAY  . OVER THE COUNTER MEDICATION Allergy relief otc, nasal spray, fluticasone spray  . testosterone (ANDROGEL) 50 MG/5GM (1%) GEL Place 5 g onto the skin daily.  . timolol (BETIMOL) 0.25 % ophthalmic solution Place 1 drop into both eyes 2 (two) times daily.    No facility-administered encounter medications on file as of 02/24/2018.      Review of Systems  Constitutional: Negative.   HENT: Negative.   Eyes: Negative.   Respiratory: Negative.   Cardiovascular: Negative.   Gastrointestinal: Negative.   Endocrine: Negative.   Genitourinary: Negative.   Musculoskeletal: Positive for arthralgias (right arm pain ).  Skin: Negative.   Allergic/Immunologic: Negative.   Neurological: Negative.  Hematological: Negative.   Psychiatric/Behavioral: Negative.         Objective:   Physical Exam  Constitutional: He is oriented to person, place, and time. He appears well-developed and well-nourished. No distress.  The patient is pleasant and alert and is commended for losing weight through exercise and diet  HENT:  Head: Normocephalic and atraumatic.  Nose: Nose normal.  Mouth/Throat: Oropharynx is clear and moist. No oropharyngeal exudate.  Bilateral ear cerumen  Eyes: Pupils are equal, round, and reactive to light. Conjunctivae and EOM are normal. Right eye exhibits no discharge. Left eye exhibits no discharge. No  scleral icterus.  Keep appointment in May as planned  Neck: Normal range of motion. Neck supple. No thyromegaly present.  No bruits thyromegaly or anterior cervical adenopathy  Cardiovascular: Normal rate, regular rhythm, normal heart sounds and intact distal pulses.  No murmur heard. Heart has a regular rate and rhythm at 72/min  Pulmonary/Chest: Effort normal and breath sounds normal. No respiratory distress. He has no wheezes. He has no rales. He exhibits no tenderness.  Clear anteriorly and posteriorly and no axillary adenopathy or chest wall masses  Abdominal: Soft. Bowel sounds are normal. He exhibits no mass. There is no tenderness. There is no rebound and no guarding.  No abdominal tenderness masses organ enlargement or bruits or inguinal adenopathy  Genitourinary: Rectum normal and penis normal.  Genitourinary Comments: The prostate is slightly enlarged but soft and smooth.  The rectal exam was negative for masses except for a small posterior hemorrhoidal tag.  The external genitalia were normal.  Musculoskeletal: Normal range of motion. He exhibits deformity. He exhibits no edema or tenderness.  Patient has a dorsal ganglion cyst of the right wrist.  We will do a referral for him to see the orthopedic surgeon.  Lymphadenopathy:    He has no cervical adenopathy.  Neurological: He is alert and oriented to person, place, and time. He has normal reflexes. No cranial nerve deficit.  Skin: Skin is warm and dry. No rash noted.  Dry fair skin.  Patient sees dermatologist regularly.  Psychiatric: He has a normal mood and affect. His behavior is normal. Judgment and thought content normal.  Nursing note and vitals reviewed.   BP 126/73 (BP Location: Left Arm)   Pulse 65   Temp (!) 96.6 F (35.9 C) (Oral)   Ht _0  (1.753 m)   Wt 172 lb (78 kg)   BMI 25.40 kg/m   Ear irrigation to remove cerumen both ears.     Assessment & Plan:  1. Hyperlipidemia associated with type 2 diabetes  mellitus ([place]) -Continue current treatment and as aggressive therapeutic lifestyle changes as possible - CBC with Differential/Platelet; Future - Lipid panel; Future  2. Essential hypertension, benign -Blood pressure is good today and he will continue with current treatment - CBC with Differential/Platelet; Future - BMP8+EGFR; Future - Hepatic function panel; Future  3. Testosterone deficiency -Patient is feeling well regarding testosterone replacement and rectal exam appeared negative without any prostate nodules being palpated. - CBC with Differential/Platelet; Future - PSA, total and free; Future - Testosterone,Free and Total; Future - Urinalysis, Complete  4. Vitamin D deficiency -Continue current treatment pending results of lab work - CBC with Differential/Platelet; Future - VITAMIN D 25 Hydroxy (Vit-D Deficiency, Fractures); Future  5. Type 2 diabetes mellitus without complication, without long-term current use of insulin ([facility]) -Home blood sugars and blood pressures were reviewed and the blood sugars seem to be the highest in the  morning in the 150-170 range. - CBC with Differential/Platelet; Future  6. Screen for colon cancer -She is due for colonoscopy as it has been 10 years since the last one. - Ambulatory referral to Gastroenterology  7. Ganglion cyst of dorsum of right wrist -Referred to orthopedic surgeon  8. Excessive cerumen in ear canal, bilateral -Ear irrigation to remove cerumen  Meds ordered this encounter  Medications  . Icosapent Ethyl (VASCEPA) 1 g CAPS    Sig: Take 2 capsules (2 g total) by mouth 2 (two) times daily.    Dispense:  360 capsule    Refill:  3   Patient Instructions  Continue current medications. Continue good therapeutic lifestyle changes which include good diet and exercise. Fall precautions discussed with patient. If an FOBT was given today- please return it to our front desk. If you are over 40 years old - you may need Prevnar  62 or the adult Pneumonia vaccine.  **Flu shots are available--- please call and schedule a FLU-CLINIC appointment**  After your visit with [place] today you will receive a survey in the mail or online from [employer] regarding your care with [place]. Please take a moment to fill this out. Your feedback is very important to [place] as you can help [place] better understand your patient needs as well as improve your experience and satisfaction. WE CARE ABOUT YOU!!!   Continue to exercise follow your diet and do as aggressive therapeutic lifestyle changes as possible Continue to monitor blood pressures and blood sugars as you have been doing We will send in a prescription for the vascepa and if insurance does not pay for this you should continue with fenofibrate and Lovaza. Return to the office for fasting lab work  Arrie Senate MD

## 2018-02-26 ENCOUNTER — Other Ambulatory Visit: Payer: Self-pay | Admitting: Family Medicine

## 2018-02-27 ENCOUNTER — Other Ambulatory Visit

## 2018-02-27 DIAGNOSIS — E119 Type 2 diabetes mellitus without complications: Secondary | ICD-10-CM

## 2018-02-27 DIAGNOSIS — E349 Endocrine disorder, unspecified: Secondary | ICD-10-CM

## 2018-02-27 DIAGNOSIS — E559 Vitamin D deficiency, unspecified: Secondary | ICD-10-CM

## 2018-02-27 DIAGNOSIS — E785 Hyperlipidemia, unspecified: Secondary | ICD-10-CM

## 2018-02-27 DIAGNOSIS — E1169 Type 2 diabetes mellitus with other specified complication: Secondary | ICD-10-CM

## 2018-02-27 DIAGNOSIS — I10 Essential (primary) hypertension: Secondary | ICD-10-CM

## 2018-02-28 LAB — CBC WITH DIFFERENTIAL/PLATELET
Basophils Absolute: 0 10*3/uL (ref 0.0–0.2)
Basos: 0 %
EOS (ABSOLUTE): 0.1 10*3/uL (ref 0.0–0.4)
EOS: 1 %
HEMATOCRIT: 42.2 % (ref 37.5–51.0)
HEMOGLOBIN: 14.4 g/dL (ref 13.0–17.7)
Immature Grans (Abs): 0 10*3/uL (ref 0.0–0.1)
Immature Granulocytes: 0 %
LYMPHS ABS: 2.6 10*3/uL (ref 0.7–3.1)
Lymphs: 43 %
MCH: 30.4 pg (ref 26.6–33.0)
MCHC: 34.1 g/dL (ref 31.5–35.7)
MCV: 89 fL (ref 79–97)
MONOCYTES: 7 %
Monocytes Absolute: 0.4 10*3/uL (ref 0.1–0.9)
Neutrophils Absolute: 3 10*3/uL (ref 1.4–7.0)
Neutrophils: 49 %
Platelets: 324 10*3/uL (ref 150–379)
RBC: 4.73 x10E6/uL (ref 4.14–5.80)
RDW: 14.3 % (ref 12.3–15.4)
WBC: 6.2 10*3/uL (ref 3.4–10.8)

## 2018-02-28 LAB — BMP8+EGFR
BUN/Creatinine Ratio: 22 (ref 10–24)
BUN: 23 mg/dL (ref 8–27)
CALCIUM: 10.1 mg/dL (ref 8.6–10.2)
CHLORIDE: 102 mmol/L (ref 96–106)
CO2: 23 mmol/L (ref 20–29)
CREATININE: 1.06 mg/dL (ref 0.76–1.27)
GFR calc Af Amer: 87 mL/min/{1.73_m2} (ref >59)
GFR calc non Af Amer: 75 mL/min/{1.73_m2} (ref >59)
GLUCOSE: 137 mg/dL — AB (ref 65–99)
Potassium: 4.6 mmol/L (ref 3.5–5.2)
Sodium: 142 mmol/L (ref 134–144)

## 2018-02-28 LAB — PSA, TOTAL AND FREE
PSA FREE PCT: 33.9 %
PSA FREE: 1.12 ng/mL
Prostate Specific Ag, Serum: 3.3 ng/mL (ref 0.0–4.0)

## 2018-02-28 LAB — LIPID PANEL
CHOLESTEROL TOTAL: 159 mg/dL (ref 100–199)
Chol/HDL Ratio: 3.1 ratio (ref 0.0–5.0)
HDL: 52 mg/dL (ref >39)
LDL Calculated: 73 mg/dL (ref 0–99)
Triglycerides: 169 mg/dL — ABNORMAL HIGH (ref 0–149)
VLDL Cholesterol Cal: 34 mg/dL (ref 5–40)

## 2018-02-28 LAB — TESTOSTERONE,FREE AND TOTAL
TESTOSTERONE: 895 ng/dL (ref 264–916)
Testosterone, Free: 25.1 pg/mL — ABNORMAL HIGH (ref 6.6–18.1)

## 2018-02-28 LAB — VITAMIN D 25 HYDROXY (VIT D DEFICIENCY, FRACTURES): VIT D 25 HYDROXY: 52.8 ng/mL (ref 30.0–100.0)

## 2018-02-28 LAB — HEPATIC FUNCTION PANEL
ALBUMIN: 5 g/dL — AB (ref 3.6–4.8)
ALT: 32 IU/L (ref 0–44)
AST: 30 IU/L (ref 0–40)
Alkaline Phosphatase: 34 IU/L — ABNORMAL LOW (ref 39–117)
BILIRUBIN, DIRECT: 0.14 mg/dL (ref 0.00–0.40)
Bilirubin Total: 0.4 mg/dL (ref 0.0–1.2)
TOTAL PROTEIN: 7.6 g/dL (ref 6.0–8.5)

## 2018-03-03 ENCOUNTER — Other Ambulatory Visit: Payer: Self-pay | Admitting: *Deleted

## 2018-03-03 DIAGNOSIS — R972 Elevated prostate specific antigen [PSA]: Secondary | ICD-10-CM

## 2018-03-10 ENCOUNTER — Telehealth: Payer: Self-pay | Admitting: Family Medicine

## 2018-03-10 NOTE — Addendum Note (Signed)
Addended by: Zannie Cove on: 03/10/2018 11:03 AM   Modules accepted: Orders

## 2018-03-10 NOTE — Telephone Encounter (Signed)
Aware he will have to sign release for colon records  Dr Collene Mares -- to -- Dr Hilarie Fredrickson   Also = aware that lovaza and fenofibrate d/c when vascepa starts

## 2018-03-10 NOTE — Telephone Encounter (Signed)
LM 5/6-jhb

## 2018-03-14 ENCOUNTER — Telehealth: Payer: Self-pay

## 2018-03-14 ENCOUNTER — Telehealth: Payer: Self-pay | Admitting: Family Medicine

## 2018-03-14 ENCOUNTER — Telehealth: Payer: Self-pay | Admitting: *Deleted

## 2018-03-14 MED ORDER — ICOSAPENT ETHYL 1 G PO CAPS: 2.0000 | ORAL_CAPSULE | Freq: Two times a day (BID) | ORAL | 3 refills | Status: DC

## 2018-03-14 NOTE — Telephone Encounter (Signed)
Pharmacy states that [facility] Is needing a prior [place]

## 2018-03-14 NOTE — Telephone Encounter (Signed)
[facility] to [facility]ue lovaza and fenofibrate.  Pharmacy aware to fill vascepa

## 2018-03-14 NOTE — Telephone Encounter (Signed)
Faxed request for records to [facility] to fax# [phone] on 03/14/18 mw

## 2018-03-21 ENCOUNTER — Telehealth: Payer: Self-pay | Admitting: Family Medicine

## 2018-03-21 MED ORDER — ICOSAPENT ETHYL 1 G PO CAPS: 2.0000 | ORAL_CAPSULE | Freq: Two times a day (BID) | ORAL | 3 refills | Status: DC

## 2018-03-21 NOTE — Telephone Encounter (Signed)
Pt states he needed the rx for [facility]. Rx sent again and pt is aware.

## 2018-03-26 ENCOUNTER — Encounter: Payer: Self-pay | Admitting: Family Medicine

## 2018-04-01 ENCOUNTER — Other Ambulatory Visit

## 2018-04-01 DIAGNOSIS — R972 Elevated prostate specific antigen [PSA]: Secondary | ICD-10-CM

## 2018-04-02 LAB — PSA, TOTAL AND FREE
PROSTATE SPECIFIC AG, SERUM: 2.2 ng/mL (ref 0.0–4.0)
PSA, Free Pct: 40.9 %
PSA, Free: 0.9 ng/mL

## 2018-04-08 ENCOUNTER — Other Ambulatory Visit: Payer: Self-pay | Admitting: Family Medicine

## 2018-04-16 ENCOUNTER — Encounter: Payer: Self-pay | Admitting: Internal Medicine

## 2018-04-18 ENCOUNTER — Telehealth: Payer: Self-pay | Admitting: Internal Medicine

## 2018-04-18 NOTE — Telephone Encounter (Signed)
ROI fax to Dr Collene Mares office to request records.

## 2018-05-01 ENCOUNTER — Other Ambulatory Visit: Payer: Self-pay | Admitting: Family Medicine

## 2018-05-26 ENCOUNTER — Ambulatory Visit: Admitting: Internal Medicine

## 2018-05-27 ENCOUNTER — Other Ambulatory Visit: Payer: Self-pay | Admitting: Family Medicine

## 2018-06-02 ENCOUNTER — Ambulatory Visit (INDEPENDENT_AMBULATORY_CARE_PROVIDER_SITE_OTHER): Admitting: Nurse Practitioner

## 2018-06-02 ENCOUNTER — Encounter: Payer: Self-pay | Admitting: Nurse Practitioner

## 2018-06-02 VITALS — BP 112/79 | HR 70 | Temp 97.1°F | Ht 69.0 in | Wt 177.0 lb

## 2018-06-02 DIAGNOSIS — W57XXXA Bitten or stung by nonvenomous insect and other nonvenomous arthropods, initial encounter: Secondary | ICD-10-CM

## 2018-06-02 DIAGNOSIS — S30863A Insect bite (nonvenomous) of scrotum and testes, initial encounter: Secondary | ICD-10-CM | POA: Diagnosis not present

## 2018-06-02 NOTE — Progress Notes (Signed)
   Subjective:    Patient ID: Terry [name], male    DOB: [birth year], 62 y.o.   MRN: [id number]   Chief Complaint: Tick Removal (On scrotum  )   HPI Patient was in [place] on Wednesday. He went put walking in woods on Wednesday and on Thursday he was on flight flying to [place] and he found a deer tick on his scrotum. Area itches and has a knot where tick was removed.    Review of Systems  Constitutional: Negative.   HENT: Negative.   Respiratory: Negative.   Cardiovascular: Negative.   Gastrointestinal: Negative.   Genitourinary: Negative.   Neurological: Negative.   Hematological: Negative.   Psychiatric/Behavioral: Negative.   All other systems reviewed and are negative.      Objective:   Physical Exam  Constitutional: He is oriented to person, place, and time. He appears well-developed and well-nourished. No distress.  Neck: Normal range of motion. Neck supple.  Cardiovascular: Normal rate.  Pulmonary/Chest: Effort normal.  Neurological: He is alert and oriented to person, place, and time.  Skin: Skin is warm.  2cm raised indurated area right side of scrotom where tick was removed.  Psychiatric: He has a normal mood and affect. His behavior is normal. Judgment and thought content normal.   BP 112/79   Pulse 70   Temp (!) 97.1 F (36.2 C) (Oral)   Ht 5\' 9"  (1.753 m)   Wt 177 lb (80.3 kg)   BMI 26.14 kg/m      Assessment & Plan:  [name] in today with chief complaint of Tick Removal (On scrotum  )   1. Tick bite of scrotum, initial encounter Avoid scratching Will call with test results - Lyme Ab/Western Blot Reflex - Rocky mtn spotted fvr abs pnl(IgG+IgM)  Mary-Margaret Hassell Done, FNP

## 2018-06-02 NOTE — Patient Instructions (Signed)

## 2018-06-04 ENCOUNTER — Encounter: Payer: Self-pay | Admitting: Internal Medicine

## 2018-06-04 LAB — LYME AB/WESTERN BLOT REFLEX
LYME DISEASE AB, QUANT, IGM: <0.8 index (ref 0.00–0.79)
Lyme IgG/IgM Ab: <0.91 {ISR} (ref 0.00–0.90)

## 2018-06-04 LAB — ROCKY MTN SPOTTED FVR ABS PNL(IGG+IGM)
RMSF IgG: NEGATIVE
RMSF IgM: 0.22 index (ref 0.00–0.89)

## 2018-06-05 ENCOUNTER — Ambulatory Visit (INDEPENDENT_AMBULATORY_CARE_PROVIDER_SITE_OTHER): Admitting: Internal Medicine

## 2018-06-05 ENCOUNTER — Encounter: Payer: Self-pay | Admitting: Internal Medicine

## 2018-06-05 VITALS — BP 120/78 | HR 52 | Ht 69.0 in | Wt 177.0 lb

## 2018-06-05 DIAGNOSIS — G4733 Obstructive sleep apnea (adult) (pediatric): Secondary | ICD-10-CM

## 2018-06-05 DIAGNOSIS — K219 Gastro-esophageal reflux disease without esophagitis: Secondary | ICD-10-CM | POA: Diagnosis not present

## 2018-06-05 NOTE — Patient Instructions (Signed)
Order- [facility]- please service CPAP machine for abnormal noise, continue auto 8-20, mask of choice, humidifier, supplies, AirView  Please call if we can help

## 2018-06-05 NOTE — Progress Notes (Signed)
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 Pt wears CPAP nightly and pressure works well for patient. Pt will need new supplies-needs head strap ASAP. DL attached.  He gets up frequently at night and sometimes falls asleep as he puts mask back on, before he gets it hooked up. We discussed compliance goals. Sometimes feels pressure isn't high enough. Download 63% compliance, AHI 1.2/hour. Pressure ranges between 5.7 and 8.8 which should be within the range of current settings. We discussed trying to move the whole range a little higher to see what happens.  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 Pt wears CPAP most every night-DL is attached. Pt states he was on recent trip and noticed "noise" in CPAP machine-kept him awake-pt contacted [facility] and is waiting to see what is wrong with machine. Has not made the same "noise" again.  He is hearing noise in his machine which fluctuates with his breathing pattern and is going to take it to his DME company for evaluation.  Machine is still holding pressure.  He has made a few trips without taking the machine. Download compliance 60% AHI 2.2/hour.  ROS-see HPI + = positive Constitutional:    weight loss, night sweats, fevers, chills, fatigue, lassitude. HEENT:    headaches, difficulty swallowing, tooth/dental problems, sore throat,       sneezing, itching, ear ache, +nasal congestion, post nasal drip, snoring CV:    chest  pain, orthopnea, PND, swelling in lower extremities, anasarca,                                                     dizziness, palpitations Resp:   shortness of breath with exertion or at rest.                productive cough,   +non-productive cough, coughing up of blood.              change in color of mucus.  wheezing.   Skin:    rash or lesions. GI:  No-   heartburn, indigestion, abdominal pain, nausea, vomiting, diarrhea,                 change in bowel habits, loss of appetite GU: dysuria, change in color of urine, no urgency or frequency.   flank pain. MS:   joint pain, stiffness, decreased range of motion, back pain. Neuro-     nothing unusual Psych:  change in mood or affect.  depression or anxiety.   memory loss.  OBJ- Physical Exam           General- Alert, Oriented, Affect-appropriate, Distress- none acute, medium build Skin- rash-none, lesions- none, excoriation- none Lymphadenopathy- none Head- atraumatic            Eyes- Gross vision intact, PERRLA, conjunctivae and secretions clear            Ears- Hearing, canals-normal            Nose- Clear,  Septal dev+, mucus, polyps, erosion, perforation             Throat- Mallampati IV , mucosa clear , drainage- none, tonsils- atrophic, own teeth Neck- flexible , trachea midline, no stridor , thyroid nl, carotid no bruit Chest - symmetrical excursion , unlabored           Heart/CV- RRR , no murmur , no gallop  , no rub, nl s1 s2                           - JVD- none , edema- none, stasis changes- none, varices- none           Lung- clear to P&A, wheeze- none, cough- none , dullness-none, rub- none, + throat clearing           Chest wall-  Abd-  Br/ Gen/ Rectal- Not done, not indicated Extrem- cyanosis- none, clubbing, none, atrophy- none, strength- nl Neuro- grossly intact to observation

## 2018-06-06 NOTE — Assessment & Plan Note (Signed)
He continues to benefit from CPAP with improved sleep quality but is noting a noise in his machine. Plan-continue CPAP auto 8-20, take machine and to be serviced by DME because of noise

## 2018-06-06 NOTE — Assessment & Plan Note (Signed)
I noticed mild throat clearing and emphasized reflux precautions.

## 2018-06-16 ENCOUNTER — Ambulatory Visit (AMBULATORY_SURGERY_CENTER): Payer: Self-pay | Admitting: *Deleted

## 2018-06-16 VITALS — Ht 69.0 in | Wt 176.0 lb

## 2018-06-16 DIAGNOSIS — Z1211 Encounter for screening for malignant neoplasm of colon: Secondary | ICD-10-CM

## 2018-06-16 MED ORDER — NA SULFATE-K SULFATE-MG SULF 17.5-3.13-1.6 GM/177ML PO SOLN: 1.0000 | Freq: Once | ORAL | 0 refills | Status: AC

## 2018-06-16 NOTE — Progress Notes (Signed)
Patient denies any allergies to eggs or soy. Patient denies any problems with anesthesia/sedation. Patient denies any oxygen use at home. Patient denies taking any diet/weight loss medications or blood thinners. EMMI education offered, pt declined.  

## 2018-06-27 ENCOUNTER — Ambulatory Visit: Admitting: Family Medicine

## 2018-06-30 ENCOUNTER — Ambulatory Visit (AMBULATORY_SURGERY_CENTER): Admitting: Internal Medicine

## 2018-06-30 ENCOUNTER — Encounter: Payer: Self-pay | Admitting: Internal Medicine

## 2018-06-30 VITALS — BP 126/78 | HR 76 | Temp 98.4°F | Resp 15 | Ht 69.0 in | Wt 177.0 lb

## 2018-06-30 DIAGNOSIS — Z1211 Encounter for screening for malignant neoplasm of colon: Secondary | ICD-10-CM

## 2018-06-30 DIAGNOSIS — D120 Benign neoplasm of cecum: Secondary | ICD-10-CM

## 2018-06-30 DIAGNOSIS — D123 Benign neoplasm of transverse colon: Secondary | ICD-10-CM

## 2018-06-30 MED ORDER — SODIUM CHLORIDE 0.9 % IV SOLN: 500.0000 mL | Freq: Once | INTRAVENOUS | Status: DC

## 2018-06-30 NOTE — Op Note (Signed)
[facility] Patient Name: Terry [name] Procedure Date: 06/30/2018 1:37 PM MRN: [id number] Endoscopist: Jerene Bears , MD Age: 62 Referring MD:  Date of Birth: Dec 17, 1955 Gender: Male Account #: [id number] Procedure:                Colonoscopy Indications:              Screening for colorectal malignant neoplasm, Last                            colonoscopy 10 years ago Medicines:                Monitored Anesthesia Care Procedure:                Pre-Anesthesia Assessment:                           - Prior to the procedure, a History and Physical                            was performed, and patient medications and                            allergies were reviewed. The patient's tolerance of                            previous anesthesia was also reviewed. The risks                            and benefits of the procedure and the sedation                            options and risks were discussed with the patient.                            All questions were answered, and informed consent                            was obtained. Prior Anticoagulants: The patient has                            taken no previous anticoagulant or antiplatelet                            agents. ASA Grade Assessment: II - A patient with                            mild systemic disease. After reviewing the risks                            and benefits, the patient was deemed in                            satisfactory condition to undergo the procedure.  After obtaining informed consent, the colonoscope                            was passed under direct vision. Throughout the                            procedure, the patient's blood pressure, pulse, and                            oxygen saturations were monitored continuously. The                            Colonoscope was introduced through the anus and                            advanced to the cecum, identified by  appendiceal                            orifice and ileocecal valve. The colonoscopy was                            performed without difficulty. The patient tolerated                            the procedure well. The quality of the bowel                            preparation was good. The ileocecal valve,                            appendiceal orifice, and rectum were photographed. Scope In: 1:43:19 PM Scope Out: 2:00:14 PM Scope Withdrawal Time: 0 hours 12 minutes 52 seconds  Total Procedure Duration: 0 hours 16 minutes 55 seconds  Findings:                 The digital rectal exam was normal.                           A 9 mm polyp was found in the cecum. The polyp was                            sessile. The polyp was removed with a cold snare.                            Resection and retrieval were complete.                           A 5 mm polyp was found in the transverse colon. The                            polyp was sessile. The polyp was removed with a  cold snare. Resection and retrieval were complete.                           Multiple small-mouthed diverticula were found in                            the sigmoid colon and descending colon.                           Internal hemorrhoids were found during                            retroflexion. The hemorrhoids were small. Complications:            No immediate complications. Estimated Blood Loss:     Estimated blood loss was minimal. Impression:               - One 9 mm polyp in the cecum, removed with a cold                            snare. Resected and retrieved.                           - One 5 mm polyp in the transverse colon, removed                            with a cold snare. Resected and retrieved.                           - Diverticulosis in the sigmoid colon and in the                            descending colon.                           - Internal hemorrhoids. Recommendation:            - Patient has a contact number available for                            emergencies. The signs and symptoms of potential                            delayed complications were discussed with the                            patient. Return to normal activities tomorrow.                            Written discharge instructions were provided to the                            patient.                           - Resume previous diet.                           -  Continue present medications.                           - Await pathology results.                           - Repeat colonoscopy is recommended for                            surveillance. The colonoscopy date will be                            determined after pathology results from today's                            exam become available for review. Jerene Bears, MD 06/30/2018 2:03:31 PM This report has been signed electronically.

## 2018-06-30 NOTE — Progress Notes (Signed)
Called to room to assist during endoscopic procedure.  Patient ID and intended procedure confirmed with present staff. Received instructions for my participation in the procedure from the performing physician.  

## 2018-06-30 NOTE — Progress Notes (Signed)
Pt's states no medical or surgical changes since previsit or office visit. 

## 2018-06-30 NOTE — Patient Instructions (Signed)
Please read handouts on Polyps, Diverticulosis, and Hemorrhoids. Continue present medications.     YOU HAD AN ENDOSCOPIC PROCEDURE TODAY AT [facility]:   Refer to the procedure report that was given to you for any specific questions about what was found during the examination.  If the procedure report does not answer your questions, please call your gastroenterologist to clarify.  If you requested that your care partner not be given the details of your procedure findings, then the procedure report has been included in a sealed envelope for you to review at your convenience later.  YOU SHOULD EXPECT: Some feelings of bloating in the abdomen. Passage of more gas than usual.  Walking can help get rid of the air that was put into your GI tract during the procedure and reduce the bloating. If you had a lower endoscopy (such as a colonoscopy or flexible sigmoidoscopy) you may notice spotting of blood in your stool or on the toilet paper. If you underwent a bowel prep for your procedure, you may not have a normal bowel movement for a few days.  Please Note:  You might notice some irritation and congestion in your nose or some drainage.  This is from the oxygen used during your procedure.  There is no need for concern and it should clear up in a day or so.  SYMPTOMS TO REPORT IMMEDIATELY:   Following lower endoscopy (colonoscopy or flexible sigmoidoscopy):  Excessive amounts of blood in the stool  Significant tenderness or worsening of abdominal pains  Swelling of the abdomen that is new, acute  Fever of 100F or higher   For urgent or emergent issues, a gastroenterologist can be reached at any hour by calling [phone].   DIET:  We do recommend a small meal at first, but then you may proceed to your regular diet.  Drink plenty of fluids but you should avoid alcoholic beverages for 24 hours.  ACTIVITY:  You should plan to take it easy for the rest of today and you should NOT  DRIVE or use heavy machinery until tomorrow (because of the sedation medicines used during the test).    FOLLOW UP: Our staff will call the number listed on your records the next business day following your procedure to check on you and address any questions or concerns that you may have regarding the information given to you following your procedure. If we do not reach you, we will leave a message.  However, if you are feeling well and you are not experiencing any problems, there is no need to return our call.  We will assume that you have returned to your regular daily activities without incident.  If any biopsies were taken you will be contacted by phone or by letter within the next 1-3 weeks.  Please call us at [phone] if you have not heard about the biopsies in 3 weeks.    SIGNATURES/CONFIDENTIALITY: You and/or your care partner have signed paperwork which will be entered into your electronic medical record.  These signatures attest to the fact that that the information above on your After Visit Summary has been reviewed and is understood.  Full responsibility of the confidentiality of this discharge information lies with you and/or your care-partner.

## 2018-06-30 NOTE — Progress Notes (Signed)
Report given to PACU, vss 

## 2018-07-01 ENCOUNTER — Telehealth: Payer: Self-pay

## 2018-07-01 NOTE — Telephone Encounter (Signed)
  Follow up Call-  Call back number 06/30/2018  Post procedure Call Back phone  # [phone]  Permission to leave phone message Yes  Some recent data might be hidden     Patient questions:  Do you have a fever, pain , or abdominal swelling? No. Pain Score  0 *  Have you tolerated food without any problems? No.  Have you been able to return to your normal activities? No.  Do you have any questions about your discharge instructions: Diet   No. Medications  No. Follow up visit  No.  Do you have questions or concerns about your Care? No.  Actions: * If pain score is 4 or above: No action needed, pain <4.

## 2018-07-02 ENCOUNTER — Ambulatory Visit (INDEPENDENT_AMBULATORY_CARE_PROVIDER_SITE_OTHER)

## 2018-07-02 ENCOUNTER — Encounter: Payer: Self-pay | Admitting: Family Medicine

## 2018-07-02 ENCOUNTER — Ambulatory Visit (INDEPENDENT_AMBULATORY_CARE_PROVIDER_SITE_OTHER): Admitting: Family Medicine

## 2018-07-02 VITALS — BP 126/81 | HR 59 | Temp 97.1°F | Ht 69.0 in | Wt 173.0 lb

## 2018-07-02 DIAGNOSIS — E349 Endocrine disorder, unspecified: Secondary | ICD-10-CM

## 2018-07-02 DIAGNOSIS — E559 Vitamin D deficiency, unspecified: Secondary | ICD-10-CM | POA: Diagnosis not present

## 2018-07-02 DIAGNOSIS — E1169 Type 2 diabetes mellitus with other specified complication: Secondary | ICD-10-CM

## 2018-07-02 DIAGNOSIS — E785 Hyperlipidemia, unspecified: Secondary | ICD-10-CM | POA: Diagnosis not present

## 2018-07-02 DIAGNOSIS — Z23 Encounter for immunization: Secondary | ICD-10-CM

## 2018-07-02 DIAGNOSIS — E119 Type 2 diabetes mellitus without complications: Secondary | ICD-10-CM

## 2018-07-02 DIAGNOSIS — I10 Essential (primary) hypertension: Secondary | ICD-10-CM

## 2018-07-02 DIAGNOSIS — K5790 Diverticulosis of intestine, part unspecified, without perforation or abscess without bleeding: Secondary | ICD-10-CM | POA: Insufficient documentation

## 2018-07-02 DIAGNOSIS — K635 Polyp of colon: Secondary | ICD-10-CM | POA: Insufficient documentation

## 2018-07-02 LAB — BAYER DCA HB A1C WAIVED: HB A1C: 7.4 % — AB (ref <7.0)

## 2018-07-02 NOTE — Progress Notes (Signed)
Subjective:    Patient ID: Terry [name], male    DOB: Aug 21, 1956, 62 y.o.   MRN: [id number]  HPI Pt here for follow up and management of chronic medical problems which includes diabetes and hypertension. He is taking medication regularly.  The patient is doing well overall.  He just had his colonoscopy and a couple polyps were removed and he does not know the pathology on those polyps at this point in time.  They also found diverticuli.  The patient exercises regularly 5 times weekly and says he has no trouble with chest pain pressure tightness palpitations or shortness of breath.  He has no trouble with swallowing heartburn indigestion nausea vomiting diarrhea blood in the stool or black tarry bowel movements.  He is not sure when his next colonoscopy will be done because that is depending on the pathology.  He is passing his water well.  He last saw the ophthalmologist in May of this year, Dr. Rach[facility].  He will get a tetanus shot today.  Blood sugars and blood pressures were reviewed.  Blood sugars tend to run higher in the morning and lower during the day.  We will check his A1c and make a determination if we need to increase medication or not.  He understands to continue with his physical activity.    Patient Active Problem List   Diagnosis Date Noted  . Colon polyps 07/02/2018  . Diverticulosis 07/02/2018  . Seasonal and perennial allergic rhinitis 05/21/2016  . Obstructive sleep apnea 06/05/2015  . Esophageal reflux   . Elevated blood sugar   . BPH (benign prostatic hyperplasia)   . Unspecified hemorrhoids without mention of complication   . Testosterone deficiency   . Unspecified glaucoma(365.9)    Outpatient Encounter Medications as of 07/02/2018  Medication Sig  . amLODipine (NORVASC) 5 MG tablet Take 1 tablet (5 mg total) by mouth daily.  Marland Kitchen aspirin 81 MG EC tablet Take 81 mg by mouth daily.    Marland Kitchen atorvastatin (LIPITOR) 20 MG tablet TAKE 1 TABLET DAILY AS DIRECTED (MUST BE  SEEN BEFORE NEXT REFILL)  . bimatoprost (LUMIGAN) 0.01 % SOLN 1 drop at bedtime.  . Cholecalciferol (VITAMIN D3) 5000 units CAPS Take 1 capsule by mouth 3 (three) times a week.  Marland Kitchen FREESTYLE LITE test strip USE TO TEST BLOOD SUGAR TWICE A DAY  . hydrochlorothiazide (MICROZIDE) 12.5 MG capsule Take 1 capsule (12.5 mg total) by mouth daily.  Vanessa Kick Ethyl (VASCEPA) 1 g CAPS Take 2 capsules (2 g total) by mouth 2 (two) times daily.  Marland Kitchen losartan (COZAAR) 100 MG tablet TAKE 1 TABLET DAILY  . metFORMIN (GLUCOPHAGE) 500 MG tablet TAKE 1 TABLET DAILY WITH BREAKFAST  . OVER THE COUNTER MEDICATION Allergy relief otc, nasal spray, fluticasone spray  . testosterone (ANDROGEL) 50 MG/5GM (1%) GEL Place 5 g onto the skin daily.  . timolol (BETIMOL) 0.25 % ophthalmic solution Place 1 drop into both eyes 2 (two) times daily.    No facility-administered encounter medications on file as of 07/02/2018.      Review of Systems  Constitutional: Negative.   HENT: Negative.   Eyes: Negative.   Respiratory: Negative.   Cardiovascular: Negative.   Gastrointestinal: Negative.   Endocrine: Negative.   Genitourinary: Negative.   Musculoskeletal: Negative.   Skin: Negative.   Allergic/Immunologic: Negative.   Neurological: Negative.   Hematological: Negative.   Psychiatric/Behavioral: Negative.        Objective:   Physical Exam  Constitutional: He is  oriented to person, place, and time. He appears well-developed and well-nourished. No distress.  Patient is pleasant and doing well.  HENT:  Head: Normocephalic and atraumatic.  Right Ear: External ear normal.  Left Ear: External ear normal.  Nose: Nose normal.  Mouth/Throat: Oropharynx is clear and moist. No oropharyngeal exudate.  Minimal earwax bilaterally and throat clear.  Eyes: Pupils are equal, round, and reactive to light. Conjunctivae and EOM are normal. Right eye exhibits no discharge. Left eye exhibits no discharge. No scleral icterus.  Patient  gets eye exams regularly by Dr. [facility]  Neck: Normal range of motion. Neck supple. No tracheal deviation present. No thyromegaly present.  No bruits thyromegaly or anterior cervical or posterior cervical adenopathy  Cardiovascular: Normal rate, regular rhythm, normal heart sounds and intact distal pulses.  No murmur heard. Heart is regular at 72/min without murmurs with good pedal pulses  Pulmonary/Chest: Effort normal and breath sounds normal. He has no wheezes. He has no rales. He exhibits no tenderness.  Clear anteriorly and posteriorly and no axillary adenopathy no chest wall masses.  Abdominal: Soft. Bowel sounds are normal. He exhibits no mass. There is no tenderness. There is no rebound and no guarding.  No abdominal tenderness masses organ enlargement or bruits and no inguinal adenopathy with good inguinal pulses.  Musculoskeletal: Normal range of motion. He exhibits no edema.  Good movement of all extremities with no joint pain or swelling.  Lymphadenopathy:    He has no cervical adenopathy.  Neurological: He is alert and oriented to person, place, and time. He has normal reflexes. No cranial nerve deficit.  Skin: Skin is warm and dry. No rash noted.  Psychiatric: He has a normal mood and affect. His behavior is normal. Judgment and thought content normal.  Patient's mood affect and behavior are all normal for him.  Nursing note and vitals reviewed.   BP 126/81   Pulse (!) 59   Temp (!) 97.1 F (36.2 C) (Oral)   Ht '5\' 9"'$  (1.753 m)   Wt 173 lb (78.5 kg)   BMI 25.55 kg/m        Assessment & Plan:  1. Type 2 diabetes mellitus without complication, without long-term current use of insulin ([place]) -Home blood sugars during the day are good but more elevated early in the morning until lab work is returned continue with current treatment - BMP8+EGFR - CBC with Differential/Platelet - Bayer DCA Hb A1c Waived  2. Vitamin D deficiency -Continue with vitamin D replacement  pending results of lab work - CBC with Differential/Platelet - VITAMIN D 25 Hydroxy (Vit-D Deficiency, Fractures)  3. Testosterone deficiency -Continue with testosterone replacement pending results of lab work - CBC with Differential/Platelet  4. Essential hypertension, benign -Blood pressure is good today and he will continue with current treatment and sodium restriction is much as possible - BMP8+EGFR - CBC with Differential/Platelet - Hepatic function panel - DG Chest 2 View; Future  5. Hyperlipidemia associated with type 2 diabetes mellitus ([place]) -Continue with current treatment pending results of lab work - CBC with Differential/Platelet - Lipid panel - DG Chest 2 View; Future  Patient Instructions                       Medicare Annual Wellness Visit  [facility] and the medical providers at [facility] strive to bring you the best medical care.  In doing so we not only want to address your current medical conditions  and concerns but also to detect new conditions early and prevent illness, disease and health-related problems.    Medicare offers a yearly Wellness Visit which allows our clinical staff to assess your need for preventative services including immunizations, lifestyle education, counseling to decrease risk of preventable diseases and screening for fall risk and other medical concerns.    This visit is provided free of charge (no copay) for all Medicare recipients. The clinical pharmacists at [facility] have begun to conduct these Wellness Visits which will also include a thorough review of all your medications.    As you primary medical provider recommend that you make an appointment for your Annual Wellness Visit if you have not done so already this year.  You may set up this appointment before you leave today or you may call back ([phone]) and schedule an appointment.  Please make sure when you call that you mention that  you are scheduling your Annual Wellness Visit with the clinical pharmacist so that the appointment may be made for the proper length of time.     Continue current medications. Continue good therapeutic lifestyle changes which include good diet and exercise. Fall precautions discussed with patient. If an FOBT was given today- please return it to our front desk. If you are over 41 years old - you may need Prevnar 67 or the adult Pneumonia vaccine.  **Flu shots are available--- please call and schedule a FLU-CLINIC appointment**  After your visit with [place] today you will receive a survey in the mail or online from [employer] regarding your care with [place]. Please take a moment to fill this out. Your feedback is very important to [place] as you can help [place] better understand your patient needs as well as improve your experience and satisfaction. WE CARE ABOUT YOU!!!   Stay active physically Drink plenty of water and fluids Follow-up with gastroenterology as determined by pathology of polyps removed Monitor blood sugars regularly and blood pressure regularly We will call with lab results as soon as those results become available   Arrie Senate MD

## 2018-07-02 NOTE — Patient Instructions (Addendum)
Medicare Annual Wellness Visit  [facility] and the medical providers at [facility] strive to bring you the best medical care.  In doing so we not only want to address your current medical conditions and concerns but also to detect new conditions early and prevent illness, disease and health-related problems.    Medicare offers a yearly Wellness Visit which allows our clinical staff to assess your need for preventative services including immunizations, lifestyle education, counseling to decrease risk of preventable diseases and screening for fall risk and other medical concerns.    This visit is provided free of charge (no copay) for all Medicare recipients. The clinical pharmacists at [facility] have begun to conduct these Wellness Visits which will also include a thorough review of all your medications.    As you primary medical provider recommend that you make an appointment for your Annual Wellness Visit if you have not done so already this year.  You may set up this appointment before you leave today or you may call back ([phone]) and schedule an appointment.  Please make sure when you call that you mention that you are scheduling your Annual Wellness Visit with the clinical pharmacist so that the appointment may be made for the proper length of time.     Continue current medications. Continue good therapeutic lifestyle changes which include good diet and exercise. Fall precautions discussed with patient. If an FOBT was given today- please return it to our front desk. If you are over 75 years old - you may need Prevnar 35 or the adult Pneumonia vaccine.  **Flu shots are available--- please call and schedule a FLU-CLINIC appointment**  After your visit with [place] today you will receive a survey in the mail or online from [employer] regarding your care with [place]. Please take a moment to fill this out. Your feedback is very  important to [place] as you can help [place] better understand your patient needs as well as improve your experience and satisfaction. WE CARE ABOUT YOU!!!   Stay active physically Drink plenty of water and fluids Follow-up with gastroenterology as determined by pathology of polyps removed Monitor blood sugars regularly and blood pressure regularly We will call with lab results as soon as those results become available

## 2018-07-03 LAB — LIPID PANEL
CHOL/HDL RATIO: 2.5 ratio (ref 0.0–5.0)
Cholesterol, Total: 127 mg/dL (ref 100–199)
HDL: 50 mg/dL (ref >39)
LDL Calculated: 49 mg/dL (ref 0–99)
Triglycerides: 138 mg/dL (ref 0–149)
VLDL CHOLESTEROL CAL: 28 mg/dL (ref 5–40)

## 2018-07-03 LAB — BMP8+EGFR
BUN/Creatinine Ratio: 15 (ref 10–24)
BUN: 14 mg/dL (ref 8–27)
CO2: 24 mmol/L (ref 20–29)
CREATININE: 0.91 mg/dL (ref 0.76–1.27)
Calcium: 10 mg/dL (ref 8.6–10.2)
Chloride: 102 mmol/L (ref 96–106)
GFR, EST AFRICAN AMERICAN: 104 mL/min/{1.73_m2} (ref >59)
GFR, EST NON AFRICAN AMERICAN: 90 mL/min/{1.73_m2} (ref >59)
Glucose: 146 mg/dL — ABNORMAL HIGH (ref 65–99)
POTASSIUM: 4.7 mmol/L (ref 3.5–5.2)
SODIUM: 142 mmol/L (ref 134–144)

## 2018-07-03 LAB — CBC WITH DIFFERENTIAL/PLATELET
BASOS: 1 %
Basophils Absolute: 0 10*3/uL (ref 0.0–0.2)
EOS (ABSOLUTE): 0.1 10*3/uL (ref 0.0–0.4)
EOS: 2 %
HEMATOCRIT: 44.8 % (ref 37.5–51.0)
Hemoglobin: 15 g/dL (ref 13.0–17.7)
IMMATURE GRANS (ABS): 0 10*3/uL (ref 0.0–0.1)
IMMATURE GRANULOCYTES: 0 %
Lymphocytes Absolute: 2.9 10*3/uL (ref 0.7–3.1)
Lymphs: 41 %
MCH: 30 pg (ref 26.6–33.0)
MCHC: 33.5 g/dL (ref 31.5–35.7)
MCV: 90 fL (ref 79–97)
MONOS ABS: 0.6 10*3/uL (ref 0.1–0.9)
Monocytes: 8 %
NEUTROS ABS: 3.5 10*3/uL (ref 1.4–7.0)
NEUTROS PCT: 48 %
Platelets: 258 10*3/uL (ref 150–450)
RBC: 5 x10E6/uL (ref 4.14–5.80)
RDW: 12.7 % (ref 12.3–15.4)
WBC: 7.1 10*3/uL (ref 3.4–10.8)

## 2018-07-03 LAB — HEPATIC FUNCTION PANEL
ALT: 39 IU/L (ref 0–44)
AST: 20 IU/L (ref 0–40)
Albumin: 4.8 g/dL (ref 3.6–4.8)
Alkaline Phosphatase: 48 IU/L (ref 39–117)
Bilirubin Total: 0.5 mg/dL (ref 0.0–1.2)
Bilirubin, Direct: 0.18 mg/dL (ref 0.00–0.40)
TOTAL PROTEIN: 7.3 g/dL (ref 6.0–8.5)

## 2018-07-03 LAB — VITAMIN D 25 HYDROXY (VIT D DEFICIENCY, FRACTURES): VIT D 25 HYDROXY: 51.5 ng/mL (ref 30.0–100.0)

## 2018-07-08 ENCOUNTER — Encounter: Payer: Self-pay | Admitting: Internal Medicine

## 2018-08-25 ENCOUNTER — Other Ambulatory Visit: Payer: Self-pay | Admitting: Family Medicine

## 2018-09-26 ENCOUNTER — Other Ambulatory Visit: Payer: Self-pay | Admitting: *Deleted

## 2018-09-26 MED ORDER — TESTOSTERONE 50 MG/5GM (1%) TD GEL: 5.0000 g | Freq: Every day | TRANSDERMAL | 1 refills | Status: DC

## 2018-09-26 NOTE — Telephone Encounter (Signed)
VM from pt needing RF on his testosterone Pt uses [facility]

## 2018-10-14 ENCOUNTER — Ambulatory Visit (INDEPENDENT_AMBULATORY_CARE_PROVIDER_SITE_OTHER): Admitting: *Deleted

## 2018-10-14 DIAGNOSIS — Z23 Encounter for immunization: Secondary | ICD-10-CM | POA: Diagnosis not present

## 2018-10-22 ENCOUNTER — Other Ambulatory Visit: Payer: Self-pay | Admitting: Family Medicine

## 2018-10-28 ENCOUNTER — Other Ambulatory Visit: Payer: Self-pay | Admitting: Family Medicine

## 2018-11-18 ENCOUNTER — Ambulatory Visit (INDEPENDENT_AMBULATORY_CARE_PROVIDER_SITE_OTHER): Admitting: Family Medicine

## 2018-11-18 ENCOUNTER — Other Ambulatory Visit: Payer: Self-pay | Admitting: Family Medicine

## 2018-11-18 ENCOUNTER — Encounter: Payer: Self-pay | Admitting: Family Medicine

## 2018-11-18 VITALS — BP 108/75 | HR 65 | Temp 97.2°F | Ht 69.0 in | Wt 168.0 lb

## 2018-11-18 DIAGNOSIS — E349 Endocrine disorder, unspecified: Secondary | ICD-10-CM | POA: Diagnosis not present

## 2018-11-18 DIAGNOSIS — I10 Essential (primary) hypertension: Secondary | ICD-10-CM

## 2018-11-18 DIAGNOSIS — E119 Type 2 diabetes mellitus without complications: Secondary | ICD-10-CM

## 2018-11-18 DIAGNOSIS — E785 Hyperlipidemia, unspecified: Secondary | ICD-10-CM

## 2018-11-18 DIAGNOSIS — E1169 Type 2 diabetes mellitus with other specified complication: Secondary | ICD-10-CM

## 2018-11-18 DIAGNOSIS — E559 Vitamin D deficiency, unspecified: Secondary | ICD-10-CM

## 2018-11-18 DIAGNOSIS — Z86010 Personal history of colonic polyps: Secondary | ICD-10-CM

## 2018-11-18 DIAGNOSIS — G4730 Sleep apnea, unspecified: Secondary | ICD-10-CM

## 2018-11-18 DIAGNOSIS — E1159 Type 2 diabetes mellitus with other circulatory complications: Secondary | ICD-10-CM

## 2018-11-18 DIAGNOSIS — I152 Hypertension secondary to endocrine disorders: Secondary | ICD-10-CM

## 2018-11-18 LAB — BAYER DCA HB A1C WAIVED: HB A1C: 6.4 % (ref <7.0)

## 2018-11-18 MED ORDER — GLUCOSE BLOOD VI STRP: ORAL_STRIP | 2 refills | Status: DC

## 2018-11-18 NOTE — Progress Notes (Signed)
Subjective:    Patient ID: Terry [name], male    DOB: Mar 20, 1956, 63 y.o.   MRN: [id number]  HPI Pt here for follow up and management of chronic medical problems which includes hypertension and diabetes. He is taking medication regularly.  Patient today has questions about taking apple cider vinegar.  He will be given an FOBT to return and will get lab work today.  We are refilling his freestyle libre test strips.  This patient does go to the [place].  He has diabetes mellitus GERD sleep apnea hyperlipidemia and hypertension.  His family history is positive for heart disease and stroke.  His father is deceased.  The patient is also testosterone deficient.  He is taking atorvastatin and Vascepa.  He is also taking losartan Metformin and amlodipine.  In addition to testosterone replacement.  The patient brings in blood pressures for review and the majority of these are good he brings in readings from January.  There also readings back to September and overall these are good.  Blood sugars go back through January and range from the 120s before breakfast and lower during the day.  All of this information will be scanned into the record.  During the past few months his weight seems to be staying around 164 165.  The readings he brings in are excellent.  Patient is feeling well staying active physically.  He denies any chest pain pressure tightness or shortness of breath.  He denies any trouble with swallowing heartburn indigestion nausea vomiting diarrhea blood in the stool or black tarry bowel movements.  He did have a colonoscopy in August and polyps were found he was told he would need a repeat colonoscopy in August 2024.  He is passing his water well.  He is still on his testosterone replacement and energy level is good and sex life is normal.  As far as his eye exams are concerned he is followed about every 3 months by the ophthalmologist because of early cataracts and glaucoma issues.  The patient also has  sleep apnea.    Patient Active Problem List   Diagnosis Date Noted  . Colon polyps 07/02/2018  . Diverticulosis 07/02/2018  . Seasonal and perennial allergic rhinitis 05/21/2016  . Obstructive sleep apnea 06/05/2015  . Esophageal reflux   . Elevated blood sugar   . BPH (benign prostatic hyperplasia)   . Unspecified hemorrhoids without mention of complication   . Testosterone deficiency   . Unspecified glaucoma(365.9)    Outpatient Encounter Medications as of 11/18/2018  Medication Sig  . amLODipine (NORVASC) 5 MG tablet Take 1 tablet (5 mg total) by mouth daily.  Marland Kitchen aspirin 81 MG EC tablet Take 81 mg by mouth daily.    Marland Kitchen atorvastatin (LIPITOR) 20 MG tablet TAKE 1 TABLET DAILY AS DIRECTED (MUST BE SEEN BEFORE NEXT REFILL)  . bimatoprost (LUMIGAN) 0.01 % SOLN 1 drop at bedtime.  . Cholecalciferol (VITAMIN D3) 5000 units CAPS Take 1 capsule by mouth 3 (three) times a week.  Marland Kitchen FREESTYLE LITE test strip USE TO TEST BLOOD SUGAR TWICE A DAY  . hydrochlorothiazide (MICROZIDE) 12.5 MG capsule Take 1 capsule (12.5 mg total) by mouth daily.  Vanessa Kick Ethyl (VASCEPA) 1 g CAPS Take 2 capsules (2 g total) by mouth 2 (two) times daily.  Marland Kitchen losartan (COZAAR) 100 MG tablet TAKE 1 TABLET DAILY  . metFORMIN (GLUCOPHAGE) 500 MG tablet TAKE 1 TABLET DAILY WITH BREAKFAST  . OVER THE COUNTER MEDICATION Allergy relief otc, nasal spray,  fluticasone spray  . testosterone (ANDROGEL) 50 MG/5GM (1%) GEL Place 5 g onto the skin daily.  . timolol (BETIMOL) 0.25 % ophthalmic solution Place 1 drop into both eyes 2 (two) times daily.    No facility-administered encounter medications on file as of 11/18/2018.       Review of Systems  Constitutional: Negative.   HENT: Negative.   Eyes: Negative.   Respiratory: Negative.   Cardiovascular: Negative.   Gastrointestinal: Negative.   Endocrine: Negative.   Genitourinary: Negative.   Musculoskeletal: Negative.   Skin: Negative.   Allergic/Immunologic: Negative.    Neurological: Negative.   Hematological: Negative.   Psychiatric/Behavioral: Negative.        Objective:   Physical Exam Vitals signs and nursing note reviewed.  Constitutional:      Appearance: Normal appearance. He is well-developed and normal weight.     Comments: Patient is pleasant and positive about his health care at least from his standpoint and I tend to agree with that today.  HENT:     Head: Normocephalic and atraumatic.     Right Ear: Tympanic membrane, ear canal and external ear normal. There is no impacted cerumen.     Left Ear: Tympanic membrane, ear canal and external ear normal. There is no impacted cerumen.     Nose: Nose normal. No congestion.     Mouth/Throat:     Mouth: Mucous membranes are moist.     Pharynx: Oropharynx is clear. No oropharyngeal exudate.  Eyes:     General: No scleral icterus.       Right eye: No discharge.        Left eye: No discharge.     Extraocular Movements: Extraocular movements intact.     Conjunctiva/sclera: Conjunctivae normal.     Pupils: Pupils are equal, round, and reactive to light.     Comments: Patient is being followed regularly by ophthalmology every 3 months because of borderline elevated pressure and early cataracts.  Neck:     Musculoskeletal: Normal range of motion and neck supple. No muscular tenderness.     Thyroid: No thyromegaly.     Vascular: No carotid bruit.     Trachea: No tracheal deviation.     Comments: No bruits thyromegaly or anterior cervical adenopathy Cardiovascular:     Rate and Rhythm: Normal rate and regular rhythm.     Pulses: Normal pulses.     Heart sounds: Normal heart sounds. No murmur. No gallop.      Comments: The heart is regular at 60/min without murmurs or gallops Pulmonary:     Effort: Pulmonary effort is normal. No respiratory distress.     Breath sounds: Normal breath sounds. No wheezing or rales.     Comments: Clear anteriorly and posteriorly without chest wall masses or  axillary adenopathy Chest:     Chest wall: No tenderness.  Abdominal:     General: Abdomen is flat. Bowel sounds are normal.     Palpations: Abdomen is soft. There is no mass.     Tenderness: There is no abdominal tenderness.     Comments: No liver or spleen enlargement.  No epigastric tenderness.  No masses and no inguinal adenopathy.  Musculoskeletal: Normal range of motion.        General: No tenderness.     Right lower leg: No edema.     Left lower leg: No edema.  Lymphadenopathy:     Cervical: No cervical adenopathy.  Skin:    General: Skin is  warm and dry.     Findings: No rash.  Neurological:     General: No focal deficit present.     Mental Status: He is alert and oriented to person, place, and time.     Cranial Nerves: No cranial nerve deficit.     Deep Tendon Reflexes: Reflexes are normal and symmetric.     Comments: Reflexes are 1+ and equal bilaterally with normal sensation to both feet.  Psychiatric:        Mood and Affect: Mood normal.        Behavior: Behavior normal.        Thought Content: Thought content normal.        Judgment: Judgment normal.     Comments: Mood affect and behavior are normal for this patient.    BP 108/75 (BP Location: Left Arm)   Pulse 65   Temp (!) 97.2 F (36.2 C) (Oral)   Ht '5\' 9"'$  (1.753 m)   Wt 168 lb (76.2 kg)   BMI 24.81 kg/m         Assessment & Plan:  1. Type 2 diabetes mellitus without complication, without long-term current use of insulin ([place]) -Home blood sugars were reviewed and appear to be under very good control and he will continue with current treatment pending results of lab work - CBC with Differential/Platelet - Bayer [place] Hb A1c Waived  2. Vitamin D deficiency -Continue with vitamin D replacement pending results of lab work - CBC with Differential/Platelet - VITAMIN D 25 Hydroxy (Vit-D Deficiency, Fractures)  3. Testosterone deficiency -Continue with testosterone replacement and check prostate at next  visit.  Along with getting PSA. - CBC with Differential/Platelet  4. Essential hypertension, benign -Home blood pressures are good and blood pressure in office is excellent. - BMP8+EGFR - CBC with Differential/Platelet - Hepatic function panel  5. Hyperlipidemia associated with type 2 diabetes mellitus ([place]) -Continue with current treatment and aggressive therapeutic lifestyle changes pending results of lab work - CBC with Differential/Platelet - Lipid panel  6. History of colon polyps -Repeat colonoscopy in August 2024  7. Hypertension associated with diabetes ([place]) -Blood pressure is good today he will continue with current treatment  8. Sleep apnea, unspecified type -Continue to follow-up with pulmonology  Meds ordered this encounter  Medications  . glucose blood (FREESTYLE LITE) test strip    Sig: USE TO TEST BLOOD SUGAR TWICE A DAY    Dispense:  300 each    Refill:  2    E11.9   Patient Instructions                       Medicare Annual Wellness Visit  [facility] and the medical providers at [facility] strive to bring you the best medical care.  In doing so we not only want to address your current medical conditions and concerns but also to detect new conditions early and prevent illness, disease and health-related problems.    Medicare offers a yearly Wellness Visit which allows our clinical staff to assess your need for preventative services including immunizations, lifestyle education, counseling to decrease risk of preventable diseases and screening for fall risk and other medical concerns.    This visit is provided free of charge (no copay) for all Medicare recipients. The clinical pharmacists at [facility] have begun to conduct these Wellness Visits which will also include a thorough review of all your medications.    As you primary  medical provider recommend that you make an appointment for your Annual Wellness Visit  if you have not done so already this year.  You may set up this appointment before you leave today or you may call back ([phone]) and schedule an appointment.  Please make sure when you call that you mention that you are scheduling your Annual Wellness Visit with the clinical pharmacist so that the appointment may be made for the proper length of time.     Continue current medications. Continue good therapeutic lifestyle changes which include good diet and exercise. Fall precautions discussed with patient. If an FOBT was given today- please return it to our front desk. If you are over 34 years old - you may need Prevnar 70 or the adult Pneumonia vaccine.  **Flu shots are available--- please call and schedule a FLU-CLINIC appointment**  After your visit with [place] today you will receive a survey in the mail or online from [employer] regarding your care with [place]. Please take a moment to fill this out. Your feedback is very important to [place] as you can help [place] better understand your patient needs as well as improve your experience and satisfaction. WE CARE ABOUT YOU!!!   Return the FOBT in August the anniversary date of your most recent colonoscopy Continue to exercise and watch diet closely and watch sodium intake We will call with results of your lab work as soon as these results become available Continue to follow-up with ophthalmology  Arrie Senate MD

## 2018-11-18 NOTE — Patient Instructions (Addendum)
Medicare Annual Wellness Visit  [facility] and the medical providers at [facility] strive to bring you the best medical care.  In doing so we not only want to address your current medical conditions and concerns but also to detect new conditions early and prevent illness, disease and health-related problems.    Medicare offers a yearly Wellness Visit which allows our clinical staff to assess your need for preventative services including immunizations, lifestyle education, counseling to decrease risk of preventable diseases and screening for fall risk and other medical concerns.    This visit is provided free of charge (no copay) for all Medicare recipients. The clinical pharmacists at [facility] have begun to conduct these Wellness Visits which will also include a thorough review of all your medications.    As you primary medical provider recommend that you make an appointment for your Annual Wellness Visit if you have not done so already this year.  You may set up this appointment before you leave today or you may call back ([phone]) and schedule an appointment.  Please make sure when you call that you mention that you are scheduling your Annual Wellness Visit with the clinical pharmacist so that the appointment may be made for the proper length of time.     Continue current medications. Continue good therapeutic lifestyle changes which include good diet and exercise. Fall precautions discussed with patient. If an FOBT was given today- please return it to our front desk. If you are over 39 years old - you may need Prevnar 38 or the adult Pneumonia vaccine.  **Flu shots are available--- please call and schedule a FLU-CLINIC appointment**  After your visit with [place] today you will receive a survey in the mail or online from [employer] regarding your care with [place]. Please take a moment to fill this out. Your feedback is very  important to [place] as you can help [place] better understand your patient needs as well as improve your experience and satisfaction. WE CARE ABOUT YOU!!!   Return the FOBT in August the anniversary date of your most recent colonoscopy Continue to exercise and watch diet closely and watch sodium intake We will call with results of your lab work as soon as these results become available Continue to follow-up with ophthalmology

## 2018-11-19 LAB — CBC WITH DIFFERENTIAL/PLATELET
Basophils Absolute: 0.1 10*3/uL (ref 0.0–0.2)
Basos: 1 %
EOS (ABSOLUTE): 0.2 10*3/uL (ref 0.0–0.4)
EOS: 1 %
Hematocrit: 43.9 % (ref 37.5–51.0)
Hemoglobin: 15.1 g/dL (ref 13.0–17.7)
IMMATURE GRANS (ABS): 0 10*3/uL (ref 0.0–0.1)
Immature Granulocytes: 0 %
Lymphocytes Absolute: 2.8 10*3/uL (ref 0.7–3.1)
Lymphs: 27 %
MCH: 30.3 pg (ref 26.6–33.0)
MCHC: 34.4 g/dL (ref 31.5–35.7)
MCV: 88 fL (ref 79–97)
Monocytes Absolute: 0.8 10*3/uL (ref 0.1–0.9)
Monocytes: 8 %
Neutrophils Absolute: 6.8 10*3/uL (ref 1.4–7.0)
Neutrophils: 63 %
Platelets: 272 10*3/uL (ref 150–450)
RBC: 4.99 x10E6/uL (ref 4.14–5.80)
RDW: 13.2 % (ref 11.6–15.4)
WBC: 10.7 10*3/uL (ref 3.4–10.8)

## 2018-11-19 LAB — HEPATIC FUNCTION PANEL
ALT: 33 IU/L (ref 0–44)
AST: 20 IU/L (ref 0–40)
Albumin: 5.1 g/dL — ABNORMAL HIGH (ref 3.6–4.8)
Alkaline Phosphatase: 56 IU/L (ref 39–117)
Bilirubin Total: 0.6 mg/dL (ref 0.0–1.2)
Bilirubin, Direct: 0.18 mg/dL (ref 0.00–0.40)
Total Protein: 7.5 g/dL (ref 6.0–8.5)

## 2018-11-19 LAB — BMP8+EGFR
BUN/Creatinine Ratio: 23 (ref 10–24)
BUN: 20 mg/dL (ref 8–27)
CO2: 23 mmol/L (ref 20–29)
CREATININE: 0.87 mg/dL (ref 0.76–1.27)
Calcium: 10.4 mg/dL — ABNORMAL HIGH (ref 8.6–10.2)
Chloride: 96 mmol/L (ref 96–106)
GFR calc Af Amer: 107 mL/min/{1.73_m2} (ref >59)
GFR calc non Af Amer: 92 mL/min/{1.73_m2} (ref >59)
Glucose: 147 mg/dL — ABNORMAL HIGH (ref 65–99)
Potassium: 5.2 mmol/L (ref 3.5–5.2)
Sodium: 136 mmol/L (ref 134–144)

## 2018-11-19 LAB — LIPID PANEL
Chol/HDL Ratio: 2.6 ratio (ref 0.0–5.0)
Cholesterol, Total: 150 mg/dL (ref 100–199)
HDL: 57 mg/dL (ref >39)
LDL Calculated: 58 mg/dL (ref 0–99)
Triglycerides: 174 mg/dL — ABNORMAL HIGH (ref 0–149)
VLDL Cholesterol Cal: 35 mg/dL (ref 5–40)

## 2018-11-19 LAB — VITAMIN D 25 HYDROXY (VIT D DEFICIENCY, FRACTURES): Vit D, 25-Hydroxy: 47.4 ng/mL (ref 30.0–100.0)

## 2018-11-24 ENCOUNTER — Other Ambulatory Visit: Payer: Self-pay | Admitting: Family Medicine

## 2018-12-23 ENCOUNTER — Other Ambulatory Visit: Payer: Self-pay | Admitting: Family Medicine

## 2019-01-28 ENCOUNTER — Other Ambulatory Visit: Payer: Self-pay | Admitting: Family Medicine

## 2019-02-22 ENCOUNTER — Other Ambulatory Visit: Payer: Self-pay | Admitting: Family Medicine

## 2019-03-02 ENCOUNTER — Other Ambulatory Visit: Payer: Self-pay | Admitting: Family Medicine

## 2019-04-07 ENCOUNTER — Other Ambulatory Visit: Payer: Self-pay

## 2019-04-07 ENCOUNTER — Encounter: Payer: Self-pay | Admitting: Family Medicine

## 2019-04-07 ENCOUNTER — Ambulatory Visit (INDEPENDENT_AMBULATORY_CARE_PROVIDER_SITE_OTHER): Admitting: Family Medicine

## 2019-04-07 DIAGNOSIS — Z86010 Personal history of colonic polyps: Secondary | ICD-10-CM

## 2019-04-07 DIAGNOSIS — I10 Essential (primary) hypertension: Secondary | ICD-10-CM

## 2019-04-07 DIAGNOSIS — E785 Hyperlipidemia, unspecified: Secondary | ICD-10-CM

## 2019-04-07 DIAGNOSIS — E1169 Type 2 diabetes mellitus with other specified complication: Secondary | ICD-10-CM

## 2019-04-07 DIAGNOSIS — E349 Endocrine disorder, unspecified: Secondary | ICD-10-CM

## 2019-04-07 DIAGNOSIS — E1159 Type 2 diabetes mellitus with other circulatory complications: Secondary | ICD-10-CM

## 2019-04-07 DIAGNOSIS — E119 Type 2 diabetes mellitus without complications: Secondary | ICD-10-CM | POA: Diagnosis not present

## 2019-04-07 DIAGNOSIS — E559 Vitamin D deficiency, unspecified: Secondary | ICD-10-CM

## 2019-04-07 DIAGNOSIS — G4730 Sleep apnea, unspecified: Secondary | ICD-10-CM

## 2019-04-07 MED ORDER — GLUCOSE BLOOD VI STRP: ORAL_STRIP | 2 refills | Status: DC

## 2019-04-07 NOTE — Addendum Note (Signed)
Addended by: Zannie Cove on: 04/07/2019 03:14 PM   Modules accepted: Orders

## 2019-04-07 NOTE — Patient Instructions (Signed)
Continue to practice good hand and respiratory hygiene Continue to exercise regularly and make every effort to lose weight through diet and exercise Stay active physically Drink plenty of water Check blood sugars regularly and keep feet checked also for infection See eye doctor yearly Stay up on colonoscopies chest x-rays blood work and hemoglobin A1c's

## 2019-04-07 NOTE — Addendum Note (Signed)
Addended by: Zannie Cove on: 04/07/2019 02:48 PM   Modules accepted: Orders

## 2019-04-07 NOTE — Progress Notes (Signed)
Virtual Visit Via telephone Note I connected with@ on 04/07/19 by telephone and verified that I am speaking with the correct person or authorized healthcare agent using two identifiers. Terry [name] is currently located at home and there are no unauthorized people in close proximity. I completed this visit while in a private location in my home .  This visit type was conducted due to national recommendations for restrictions regarding the COVID-19 Pandemic (e.g. social distancing).  This format is felt to be most appropriate for this patient at this time.  All issues noted in this document were discussed and addressed.  No physical exam was performed.    I discussed the limitations, risks, security and privacy concerns of performing an evaluation and management service by telephone and the availability of in person appointments. I also discussed with the patient that there may be a patient responsible charge related to this service. The patient expressed understanding and agreed to proceed.   Date:  04/07/2019    ID:  [name]      02-02-1956        [id number]   Patient Care Team Patient Care Team: Chipper Herb, MD as PCP - General (Family Medicine)  Reason for Visit: Primary Care Follow-up     History of Present Illness & Review of Systems:     Terry [name] is a 63 y.o. year old male primary care patient that presents today for a telehealth visit.  The patient is pleasant and alert.  He is exercising 1 to 1-1/2 hours daily.  He denies any chest pain pressure tightness or shortness of breath.  He denies any trouble swallowing heartburn indigestion nausea vomiting diarrhea blood in the stool or black tarry bowel movements.  He did have a colonoscopy in August 2019 and was told that he needed another one in about 5 years because of the finding of colon polyps.  There is a remote history of one paternal uncle that did have colon cancer.  He is passing his water well and his  sex life is okay.  Since the last visit with him his mother has also had a stroke but has recovered well from this.  She previously had just heart disease.  His father did die of a stroke.  He says that he needs refills on his freestyle lite strips.  Review of systems as stated, otherwise negative.  The patient does not have symptoms concerning for COVID-19 infection (fever, chills, cough, or new shortness of breath).      Current Medications (Verified) Allergies as of 04/07/2019      Reactions   Penicillins Nausea And Vomiting      Medication List       Accurate as of April 07, 2019  8:05 AM. If you have any questions, ask your nurse or doctor.        amLODipine 5 MG tablet Commonly known as:  NORVASC TAKE 1 TABLET DAILY   aspirin 81 MG EC tablet Take 81 mg by mouth daily.   atorvastatin 20 MG tablet Commonly known as:  LIPITOR TAKE 1 TABLET DAILY AS DIRECTED (MUST BE SEEN BEFORE NEXT REFILL)   bimatoprost 0.01 % Soln Commonly known as:  LUMIGAN 1 drop at bedtime.   glucose blood test strip Commonly known as:  FREESTYLE LITE USE TO TEST BLOOD SUGAR TWICE A DAY   hydrochlorothiazide 12.5 MG capsule Commonly known as:  MICROZIDE TAKE 1 CAPSULE DAILY   losartan 100 MG tablet  Commonly known as:  COZAAR TAKE 1 TABLET DAILY   metFORMIN 500 MG tablet Commonly known as:  GLUCOPHAGE TAKE 1 TABLET DAILY WITH BREAKFAST   OVER THE COUNTER MEDICATION Allergy relief otc, nasal spray, fluticasone spray   testosterone 50 MG/5GM (1%) Gel Commonly known as:  ANDROGEL Place 5 g onto the skin daily.   timolol 0.25 % ophthalmic solution Commonly known as:  BETIMOL Place 1 drop into both eyes 2 (two) times daily.   Vascepa 1 g Caps Generic drug:  Icosapent Ethyl TAKE 2 CAPSULES TWICE A DAY (DISCONTINUE FENOFIBRATE AND LOVAZA)   Vitamin D3 125 MCG (5000 UT) Caps Take 1 capsule by mouth 3 (three) times a week.           Allergies (Verified)    Penicillins  Past  Medical History Past Medical History:  Diagnosis Date  . Diabetes mellitus without complication ([place])   . Elevated blood sugar   . Esophageal reflux   . Essential hypertension, benign   . Hyperplasia of prostate   . Other and unspecified hyperlipidemia   . Other testicular hypofunction   . Sleep apnea    CPAP   . Unspecified glaucoma(365.9)   . Unspecified hemorrhoids without mention of complication      Past Surgical History:  Procedure Laterality Date  . COLONOSCOPY    . LIPOMA EXCISION Right 2000   shoulder    Social History   Socioeconomic History  . Marital status: Married    Spouse name: Not on file  . Number of children: Not on file  . Years of education: Not on file  . Highest education level: Not on file  Occupational History  . Not on file  Social Needs  . Financial resource strain: Not on file  . Food insecurity:    Worry: Not on file    Inability: Not on file  . Transportation needs:    Medical: Not on file    Non-medical: Not on file  Tobacco Use  . Smoking status: Never Smoker  . Smokeless tobacco: Never Used  Substance and Sexual Activity  . Alcohol use: Not Currently    Comment: VERY RARE  . Drug use: No  . Sexual activity: Not on file  Lifestyle  . Physical activity:    Days per week: Not on file    Minutes per session: Not on file  . Stress: Not on file  Relationships  . Social connections:    Talks on phone: Not on file    Gets together: Not on file    Attends religious service: Not on file    Active member of club or organization: Not on file    Attends meetings of clubs or organizations: Not on file    Relationship status: Not on file  Other Topics Concern  . Not on file  Social History Narrative  . Not on file     Family History  Problem Relation Age of Onset  . Heart disease Mother   . Stroke Father   . Colon cancer Paternal Uncle        72's  . Esophageal cancer Neg Hx   . Rectal cancer Neg Hx   . Stomach cancer Neg  Hx       Labs/Other Tests and Data Reviewed:    Wt Readings from Last 3 Encounters:  11/18/18 168 lb (76.2 kg)  07/02/18 173 lb (78.5 kg)  06/30/18 177 lb (80.3 kg)   Temp Readings from Last 3 Encounters:  11/18/18 (!) 97.2 F (36.2 C) (Oral)  07/02/18 (!) 97.1 F (36.2 C) (Oral)  06/30/18 98.4 F (36.9 C)   BP Readings from Last 3 Encounters:  11/18/18 108/75  07/02/18 126/81  06/30/18 126/78   Pulse Readings from Last 3 Encounters:  11/18/18 65  07/02/18 (!) 59  06/30/18 76     Lab Results  Component Value Date   HGBA1C 6.4 11/18/2018   HGBA1C 7.4 (H) 07/02/2018   HGBA1C 6.9 10/18/2017   Lab Results  Component Value Date   LDLCALC 58 11/18/2018   CREATININE 0.87 11/18/2018       Chemistry      Component Value Date/Time   NA 136 11/18/2018 0852   K 5.2 11/18/2018 0852   CL 96 11/18/2018 0852   CO2 23 11/18/2018 0852   BUN 20 11/18/2018 0852   CREATININE 0.87 11/18/2018 0852   CREATININE 1.05 02/19/2013 1005      Component Value Date/Time   CALCIUM 10.4 (H) 11/18/2018 0852   ALKPHOS 56 11/18/2018 0852   AST 20 11/18/2018 0852   ALT 33 11/18/2018 0852   BILITOT 0.6 11/18/2018 0852         OBSERVATIONS/ OBJECTIVE:     The patient is alert and positive.  He responded to questions appropriately asked of him.  His weight is 167 and it is down slightly from his normal weight but ideally he should be down closer to 160.  His blood pressures are running in the 130s over the 80s and just today it was 122/78.  His blood sugars fasting are running in the 140 range and usually lower when checked during the day and less than 100.  He says there is no sign of any infections or sores on his feet.  Physical exam deferred due to nature of telephonic visit.  ASSESSMENT & PLAN    Time:   Today, I have spent 24 minutes with the patient via telephone discussing the above including Covid precautions.     Visit Diagnoses: 1. Type 2 diabetes mellitus without  complication, without long-term current use of insulin ([place]) -Continue with aggressive therapeutic lifestyle changes and current treatment pending results of lab work -Patient will come in for lab work around the end of June or 1 July.  Because he is taking testosterone he will need a PSA at that time in addition to his hemoglobin A1c and regular lab work.  He will come back and see a provider in about 6 months and get his rectal exam at that time.  He will return in [facility] in August or September.  2. Testosterone deficiency -Check testosterone level with blood work and continue with current testosterone treatment regimen pending results of lab work  3. Hyperlipidemia associated with type 2 diabetes mellitus ([place]) -Continue with statin therapy and that Cipro and therapeutic lifestyle changes  4. Hypertension associated with diabetes ([place]) -Continue to watch diet closely limit salt intake and check blood pressures regularly  5. Vitamin D deficiency -Continue with vitamin D replacement pending results of lab work  6. Essential hypertension, benign -Continue with current treatment aggressive therapeutic lifestyle changes to achieve weight loss  7. History of colon polyps -Follow-up with gastroenterologist as planned which patient thinks is about 5 years from last summer.  8. Sleep apnea, unspecified type -Continue with CPAP  Patient Instructions  Continue to practice good hand and respiratory hygiene Continue to exercise regularly and make every effort to lose weight through diet and exercise Stay active  physically Drink plenty of water Check blood sugars regularly and keep feet checked also for infection See eye doctor yearly Stay up on colonoscopies chest x-rays blood work and hemoglobin A1c's      The above assessment and management plan was discussed with the patient. The patient verbalized understanding of and has agreed to the management plan. Patient is aware to call the  clinic if symptoms persist or worsen. Patient is aware when to return to the clinic for a follow-up visit. Patient educated on when it is appropriate to go to the emergency department.    Chipper Herb, MD [address], [place], [place] [address] Ph [phone]   Arrie Senate MD

## 2019-04-24 ENCOUNTER — Telehealth: Payer: Self-pay | Admitting: Family Medicine

## 2019-04-24 MED ORDER — TESTOSTERONE 50 MG/5GM (1%) TD GEL: 5.0000 g | Freq: Every day | TRANSDERMAL | 1 refills | Status: DC

## 2019-04-24 NOTE — Telephone Encounter (Signed)
I did send it.

## 2019-04-24 NOTE — Telephone Encounter (Signed)
Has to be send by provider  Patient aware rx will be sent.  Rx pended- please sign order

## 2019-04-24 NOTE — Telephone Encounter (Signed)
Aware. 

## 2019-04-24 NOTE — Telephone Encounter (Signed)
Refill patient's testosterone, in the future we will have to do during the visit.

## 2019-04-28 ENCOUNTER — Other Ambulatory Visit: Payer: Self-pay | Admitting: Family Medicine

## 2019-05-06 ENCOUNTER — Other Ambulatory Visit: Payer: Self-pay

## 2019-05-06 ENCOUNTER — Other Ambulatory Visit

## 2019-05-06 DIAGNOSIS — E559 Vitamin D deficiency, unspecified: Secondary | ICD-10-CM

## 2019-05-06 DIAGNOSIS — I10 Essential (primary) hypertension: Secondary | ICD-10-CM

## 2019-05-06 DIAGNOSIS — E785 Hyperlipidemia, unspecified: Secondary | ICD-10-CM

## 2019-05-06 DIAGNOSIS — I152 Hypertension secondary to endocrine disorders: Secondary | ICD-10-CM

## 2019-05-06 DIAGNOSIS — Z860100 Personal history of colon polyps, unspecified: Secondary | ICD-10-CM

## 2019-05-06 DIAGNOSIS — G4730 Sleep apnea, unspecified: Secondary | ICD-10-CM

## 2019-05-06 DIAGNOSIS — E349 Endocrine disorder, unspecified: Secondary | ICD-10-CM

## 2019-05-06 DIAGNOSIS — E119 Type 2 diabetes mellitus without complications: Secondary | ICD-10-CM

## 2019-05-06 DIAGNOSIS — E1159 Type 2 diabetes mellitus with other circulatory complications: Secondary | ICD-10-CM

## 2019-05-06 DIAGNOSIS — E1169 Type 2 diabetes mellitus with other specified complication: Secondary | ICD-10-CM

## 2019-05-06 LAB — BAYER DCA HB A1C WAIVED: HB A1C (BAYER DCA - WAIVED): 6.2 % (ref <7.0)

## 2019-05-07 LAB — BMP8+EGFR
BUN/Creatinine Ratio: 22 (ref 10–24)
BUN: 20 mg/dL (ref 8–27)
CO2: 23 mmol/L (ref 20–29)
Calcium: 10.3 mg/dL — ABNORMAL HIGH (ref 8.6–10.2)
Chloride: 100 mmol/L (ref 96–106)
Creatinine, Ser: 0.92 mg/dL (ref 0.76–1.27)
GFR calc Af Amer: 103 mL/min/{1.73_m2} (ref >59)
GFR calc non Af Amer: 89 mL/min/{1.73_m2} (ref >59)
Glucose: 148 mg/dL — ABNORMAL HIGH (ref 65–99)
Potassium: 4.8 mmol/L (ref 3.5–5.2)
Sodium: 138 mmol/L (ref 134–144)

## 2019-05-07 LAB — LIPID PANEL
Chol/HDL Ratio: 2.9 ratio (ref 0.0–5.0)
Cholesterol, Total: 157 mg/dL (ref 100–199)
HDL: 55 mg/dL (ref >39)
LDL Calculated: 69 mg/dL (ref 0–99)
Triglycerides: 166 mg/dL — ABNORMAL HIGH (ref 0–149)
VLDL Cholesterol Cal: 33 mg/dL (ref 5–40)

## 2019-05-07 LAB — HEPATIC FUNCTION PANEL
ALT: 42 IU/L (ref 0–44)
AST: 26 IU/L (ref 0–40)
Albumin: 5.1 g/dL — ABNORMAL HIGH (ref 3.8–4.8)
Alkaline Phosphatase: 50 IU/L (ref 39–117)
Bilirubin Total: 0.5 mg/dL (ref 0.0–1.2)
Bilirubin, Direct: 0.15 mg/dL (ref 0.00–0.40)
Total Protein: 7.2 g/dL (ref 6.0–8.5)

## 2019-05-07 LAB — CBC WITH DIFFERENTIAL/PLATELET
Basophils Absolute: 0 10*3/uL (ref 0.0–0.2)
Basos: 1 %
EOS (ABSOLUTE): 0.1 10*3/uL (ref 0.0–0.4)
Eos: 2 %
Hematocrit: 45.2 % (ref 37.5–51.0)
Hemoglobin: 15.2 g/dL (ref 13.0–17.7)
Immature Grans (Abs): 0 10*3/uL (ref 0.0–0.1)
Immature Granulocytes: 0 %
Lymphocytes Absolute: 2.9 10*3/uL (ref 0.7–3.1)
Lymphs: 37 %
MCH: 30.2 pg (ref 26.6–33.0)
MCHC: 33.6 g/dL (ref 31.5–35.7)
MCV: 90 fL (ref 79–97)
Monocytes Absolute: 0.6 10*3/uL (ref 0.1–0.9)
Monocytes: 8 %
Neutrophils Absolute: 4 10*3/uL (ref 1.4–7.0)
Neutrophils: 52 %
Platelets: 251 10*3/uL (ref 150–450)
RBC: 5.04 x10E6/uL (ref 4.14–5.80)
RDW: 13.4 % (ref 11.6–15.4)
WBC: 7.7 10*3/uL (ref 3.4–10.8)

## 2019-05-07 LAB — PSA, TOTAL AND FREE
PSA, Free Pct: 34.6 %
PSA, Free: 0.97 ng/mL
Prostate Specific Ag, Serum: 2.8 ng/mL (ref 0.0–4.0)

## 2019-05-07 LAB — VITAMIN D 25 HYDROXY (VIT D DEFICIENCY, FRACTURES): Vit D, 25-Hydroxy: 54.5 ng/mL (ref 30.0–100.0)

## 2019-05-07 LAB — TESTOSTERONE,FREE AND TOTAL
Testosterone, Free: 10.1 pg/mL (ref 6.6–18.1)
Testosterone: 317 ng/dL (ref 264–916)

## 2019-05-12 ENCOUNTER — Telehealth: Payer: Self-pay | Admitting: Family Medicine

## 2019-05-12 LAB — HM DIABETES EYE EXAM

## 2019-05-12 MED ORDER — OLMESARTAN MEDOXOMIL 40 MG PO TABS: 40.0000 mg | ORAL_TABLET | Freq: Every day | ORAL | 1 refills | Status: DC

## 2019-05-12 NOTE — Telephone Encounter (Signed)
Patient aware.

## 2019-05-12 NOTE — Telephone Encounter (Signed)
Changed from losartan to benicar

## 2019-05-17 ENCOUNTER — Other Ambulatory Visit: Payer: Self-pay | Admitting: Family Medicine

## 2019-05-23 ENCOUNTER — Other Ambulatory Visit: Payer: Self-pay | Admitting: Family Medicine

## 2019-05-25 ENCOUNTER — Other Ambulatory Visit: Payer: Self-pay | Admitting: Family Medicine

## 2019-05-31 ENCOUNTER — Other Ambulatory Visit: Payer: Self-pay | Admitting: Family Medicine

## 2019-06-01 ENCOUNTER — Encounter: Payer: Self-pay | Admitting: Internal Medicine

## 2019-06-02 ENCOUNTER — Ambulatory Visit (INDEPENDENT_AMBULATORY_CARE_PROVIDER_SITE_OTHER): Admitting: Internal Medicine

## 2019-06-02 ENCOUNTER — Encounter: Payer: Self-pay | Admitting: Internal Medicine

## 2019-06-02 ENCOUNTER — Other Ambulatory Visit: Payer: Self-pay

## 2019-06-02 DIAGNOSIS — G4733 Obstructive sleep apnea (adult) (pediatric): Secondary | ICD-10-CM | POA: Diagnosis not present

## 2019-06-02 DIAGNOSIS — N400 Enlarged prostate without lower urinary tract symptoms: Secondary | ICD-10-CM | POA: Diagnosis not present

## 2019-06-02 NOTE — Progress Notes (Signed)
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 Pt wears CPAP most every night-DL is attached. Pt states he was on recent trip and noticed "noise" in CPAP machine-kept him awake-pt contacted [facility] and is waiting to see what is wrong with machine. Has not made the same "noise" again.  He is hearing noise in his machine which fluctuates with his breathing pattern and is going to take it to his DME company for evaluation.  Machine is still holding pressure.  He has made a few trips without taking the machine. Download compliance 60% AHI 2.2/hour.    06/02/2019- Virtual Visit via Telephone Note  I connected with Terry [name] on 06/02/19 at  9:30 AM EDT by telephone and verified that I am speaking with the correct person using two identifiers.  Location: Patient: H Provider: O   I discussed the limitations, risks, security and privacy concerns of performing an evaluation and management service by telephone and the availability of in person appointments. I also discussed with the patient that there may be a patient responsible charge related to this service. The patient expressed understanding and agreed to proceed.  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 Denies other medical issues at this time.   Observations/Objective: Download 70% compliance, AHI 2.0/ hr   Occasional short nights, not missing completely.  Assessment and Plan: OSA-  benefits from CPAP- Plan continue auto 8-20 HBP- educated interaction between OSA and HBP Follow Up Instructions: 1 year   I discussed the assessment and treatment plan with the patient. The patient was provided an opportunity to ask questions and all were answered. The patient agreed with the plan and demonstrated an understanding of the instructions.   The patient was advised to call back or seek an in-person evaluation if the symptoms worsen or if the condition fails to improve as anticipated.  I provided 18 minutes of non-face-to-face time during this encounter.   Baird Lyons, MD     ROS-see HPI + = positive Constitutional:    weight loss, night sweats, fevers, chills, fatigue, lassitude. HEENT:    headaches, difficulty swallowing, tooth/dental problems, sore throat,       sneezing, itching, ear ache, +nasal congestion, post nasal drip, snoring CV:    chest pain, orthopnea, PND, swelling in lower extremities, anasarca,                                                     dizziness, palpitations Resp:   shortness of breath with exertion or at rest.                productive cough,   +non-productive cough, coughing up of blood.              change in color of mucus.  wheezing.   Skin:    rash or lesions. GI:  No-   heartburn, indigestion, abdominal pain, nausea, vomiting, diarrhea,  change in bowel habits, loss of appetite GU: dysuria, change in color of urine, no urgency or frequency.   flank pain. MS:   joint pain, stiffness, decreased range of motion, back pain. Neuro-     nothing unusual Psych:  change in mood or affect.  depression or anxiety.   memory loss.  OBJ- Physical Exam           General- Alert, Oriented, Affect-appropriate, Distress- none acute, medium build Skin- rash-none, lesions- none, excoriation- none Lymphadenopathy- none Head- atraumatic            Eyes- Gross vision intact, PERRLA, conjunctivae and secretions clear            Ears-  Hearing, canals-normal            Nose- Clear, Septal dev+, mucus, polyps, erosion, perforation             Throat- Mallampati IV , mucosa clear , drainage- none, tonsils- atrophic, own teeth Neck- flexible , trachea midline, no stridor , thyroid nl, carotid no bruit Chest - symmetrical excursion , unlabored           Heart/CV- RRR , no murmur , no gallop  , no rub, nl s1 s2                           - JVD- none , edema- none, stasis changes- none, varices- none           Lung- clear to P&A, wheeze- none, cough- none , dullness-none, rub- none, + throat clearing           Chest wall-  Abd-  Br/ Gen/ Rectal- Not done, not indicated Extrem- cyanosis- none, clubbing, none, atrophy- none, strength- nl Neuro- grossly intact to observation

## 2019-06-02 NOTE — Patient Instructions (Addendum)
I'm glad your CPAP seems to be doing well. The pressure is good. Remember our goal is to be able to use your machine all night, and any time you sleep. If there are problems please let us know, or contact the home care company.  Plan- Continue CPAP auto 8-20, mask of choice, humidifier, supplies, AirView/ card

## 2019-06-21 ENCOUNTER — Other Ambulatory Visit: Payer: Self-pay | Admitting: Family Medicine

## 2019-07-19 NOTE — Assessment & Plan Note (Signed)
Not causing significant sleep disturbance with nocturia at this time

## 2019-07-19 NOTE — Assessment & Plan Note (Signed)
Benefits from CPAP- better sleep. Good compliance and control Plan- continue auto 8-20

## 2019-08-04 ENCOUNTER — Other Ambulatory Visit: Payer: Self-pay

## 2019-08-10 ENCOUNTER — Other Ambulatory Visit: Payer: Self-pay

## 2019-08-11 ENCOUNTER — Ambulatory Visit (INDEPENDENT_AMBULATORY_CARE_PROVIDER_SITE_OTHER)

## 2019-08-11 DIAGNOSIS — Z23 Encounter for immunization: Secondary | ICD-10-CM | POA: Diagnosis not present

## 2019-08-19 ENCOUNTER — Other Ambulatory Visit: Payer: Self-pay

## 2019-08-20 ENCOUNTER — Encounter: Payer: Self-pay | Admitting: Family Medicine

## 2019-08-20 ENCOUNTER — Ambulatory Visit (INDEPENDENT_AMBULATORY_CARE_PROVIDER_SITE_OTHER): Admitting: Family Medicine

## 2019-08-20 VITALS — BP 124/79 | HR 51 | Temp 98.9°F | Ht 69.0 in | Wt 162.0 lb

## 2019-08-20 DIAGNOSIS — E1159 Type 2 diabetes mellitus with other circulatory complications: Secondary | ICD-10-CM

## 2019-08-20 DIAGNOSIS — E1169 Type 2 diabetes mellitus with other specified complication: Secondary | ICD-10-CM | POA: Diagnosis not present

## 2019-08-20 DIAGNOSIS — F4321 Adjustment disorder with depressed mood: Secondary | ICD-10-CM

## 2019-08-20 DIAGNOSIS — Z79899 Other long term (current) drug therapy: Secondary | ICD-10-CM

## 2019-08-20 DIAGNOSIS — E349 Endocrine disorder, unspecified: Secondary | ICD-10-CM

## 2019-08-20 DIAGNOSIS — Z23 Encounter for immunization: Secondary | ICD-10-CM

## 2019-08-20 DIAGNOSIS — Z1211 Encounter for screening for malignant neoplasm of colon: Secondary | ICD-10-CM

## 2019-08-20 DIAGNOSIS — I10 Essential (primary) hypertension: Secondary | ICD-10-CM | POA: Diagnosis not present

## 2019-08-20 DIAGNOSIS — E785 Hyperlipidemia, unspecified: Secondary | ICD-10-CM | POA: Diagnosis not present

## 2019-08-20 DIAGNOSIS — I152 Hypertension secondary to endocrine disorders: Secondary | ICD-10-CM

## 2019-08-20 DIAGNOSIS — E119 Type 2 diabetes mellitus without complications: Secondary | ICD-10-CM

## 2019-08-20 LAB — BASIC METABOLIC PANEL
BUN/Creatinine Ratio: 29 — ABNORMAL HIGH (ref 10–24)
BUN: 25 mg/dL (ref 8–27)
CO2: 20 mmol/L (ref 20–29)
Calcium: 10 mg/dL (ref 8.6–10.2)
Chloride: 102 mmol/L (ref 96–106)
Creatinine, Ser: 0.87 mg/dL (ref 0.76–1.27)
GFR calc Af Amer: 106 mL/min/{1.73_m2} (ref >59)
GFR calc non Af Amer: 92 mL/min/{1.73_m2} (ref >59)
Glucose: 148 mg/dL — ABNORMAL HIGH (ref 65–99)
Potassium: 4.3 mmol/L (ref 3.5–5.2)
Sodium: 137 mmol/L (ref 134–144)

## 2019-08-20 LAB — CBC
Hematocrit: 43.5 % (ref 37.5–51.0)
Hemoglobin: 13.9 g/dL (ref 13.0–17.7)
MCH: 28.8 pg (ref 26.6–33.0)
MCHC: 32 g/dL (ref 31.5–35.7)
MCV: 90 fL (ref 79–97)
Platelets: 273 10*3/uL (ref 150–450)
RBC: 4.82 x10E6/uL (ref 4.14–5.80)
RDW: 13.4 % (ref 11.6–15.4)
WBC: 6.6 10*3/uL (ref 3.4–10.8)

## 2019-08-20 LAB — BAYER DCA HB A1C WAIVED: HB A1C (BAYER DCA - WAIVED): 6.3 % (ref <7.0)

## 2019-08-20 NOTE — Addendum Note (Signed)
Addended by: Earlene Plater on: 08/20/2019 11:07 AM   Modules accepted: Orders

## 2019-08-20 NOTE — Patient Instructions (Addendum)
You had labs performed today.  You will be contacted with the results of the labs once they are available, usually in the next 3 business days for routine lab work.  If you have an active my chart account, they will be released to your MyChart.  If you prefer to have these labs released to you via telephone, please let us know.  Your testosterone lab order is in.  Please come in early morning (8am) for this lab draw.

## 2019-08-20 NOTE — Addendum Note (Signed)
Addended byFaylene Million C on: 08/20/2019 10:50 AM   Modules accepted: Orders

## 2019-08-20 NOTE — Progress Notes (Signed)
Subjective: CC:DM2, HTN, HLD, est care PCP: Janora Norlander, DO [id number] Terry [name] is a 63 y.o. male presenting to clinic today for:  1. Type 2 Diabetes w/ HTN, HLD:  Patient reports that overall he has been physically doing okay.  He remains physically active and tries to watch his diet and maintain good health.  His blood sugars have been running around the low 100s to 130s in the morning.  No hypoglycemic episodes.  He is compliant with Metformin and Benicar.  He also takes atorvastatin and vascepa for cholesterol.  Last eye exam: Has every 6 months with Mardene Speak at [facility].  He has known glaucoma Last foot exam: UTD Last A1c:  Lab Results  Component Value Date   HGBA1C 6.2 05/06/2019   Nephropathy screen indicated?: on ARB Last flu, zoster and/or pneumovax:  Immunization History  Administered Date(s) Administered  . Influenza Split 10/13/2015  . Influenza Whole 08/01/2010  . Influenza,inj,Quad PF,6+ Mos 08/01/2016, 08/30/2017, 10/14/2018, 08/11/2019  . Tdap 03/10/2008, 07/02/2018  . Zoster 01/11/2017    ROS: Denies dizziness, LOC, polyuria, polydipsia, unintended weight loss/gain, foot ulcerations, numbness or tingling in extremities, shortness of breath or chest pain.  2. Hypogonadism Uses testosterone daily.  No fatigue.  He is exercising regularly.  3.  Grief Patient reports a sudden passing of his wife in August.  He notes that he is coping with the help of other pastors and his family members.  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  This is his second wife to have passed.  He has a child who resides with him and several others that live around the [place], and 1 of whom resides in [place].  He does not feel that he needs medication at this time but is appreciative of the offer.  ROS: Per HPI  Allergies  Allergen Reactions  . Penicillins Nausea And Vomiting   Past Medical History:   Diagnosis Date  . Diabetes mellitus without complication ([place])   . Elevated blood sugar   . Esophageal reflux   . Essential hypertension, benign   . Hyperplasia of prostate   . Other and unspecified hyperlipidemia   . Other testicular hypofunction   . Sleep apnea    CPAP   . Unspecified glaucoma(365.9)   . Unspecified hemorrhoids without mention of complication     Current Outpatient Medications:  .  amLODipine (NORVASC) 5 MG tablet, TAKE 1 TABLET DAILY, Disp: 90 tablet, Rfl: 3 .  aspirin 81 MG EC tablet, Take 81 mg by mouth daily.  , Disp: , Rfl:  .  atorvastatin (LIPITOR) 20 MG tablet, TAKE 1 TABLET DAILY AS DIRECTED (MUST BE SEEN BEFORE NEXT REFILL), Disp: 90 tablet, Rfl: 3 .  bimatoprost (LUMIGAN) 0.01 % SOLN, 1 drop at bedtime., Disp: , Rfl:  .  Cholecalciferol (VITAMIN D3) 5000 units CAPS, Take 1 capsule by mouth 3 (three) times a week., Disp: , Rfl:  .  glucose blood (FREESTYLE LITE) test strip, USE TO TEST BLOOD SUGAR TWICE A DAY, Disp: 300 each, Rfl: 2 .  hydrochlorothiazide (MICROZIDE) 12.5 MG capsule, TAKE 1 CAPSULE DAILY, Disp: 90 capsule, Rfl: 1 .  metFORMIN (GLUCOPHAGE) 500 MG tablet, TAKE 1 TABLET DAILY WITH BREAKFAST, Disp: 90 tablet, Rfl: 3 .  olmesartan (BENICAR) 40 MG tablet, Take 1 tablet (40 mg total) by mouth daily., Disp: 90 tablet, Rfl: 1 .  OVER THE COUNTER MEDICATION, Allergy relief otc,  nasal spray, fluticasone spray, Disp: , Rfl:  .  testosterone (ANDROGEL) 50 MG/5GM (1%) GEL, Place 5 g onto the skin daily., Disp: 450 g, Rfl: 1 .  timolol (BETIMOL) 0.25 % ophthalmic solution, Place 1 drop into both eyes 2 (two) times daily. , Disp: , Rfl:  .  VASCEPA 1 g CAPS, TAKE 2 CAPSULES TWICE A DAY (DISCONTINUE FENOFIBRATE AND LOVAZA), Disp: 360 capsule, Rfl: 1 Social History   Socioeconomic History  . Marital status: Widowed    Spouse name: Not on file  . Number of children: Not on file  . Years of education: Not on file  . Highest education level: Not on file   Occupational History  . Not on file  Social Needs  . Financial resource strain: Not on file  . Food insecurity    Worry: Not on file    Inability: Not on file  . Transportation needs    Medical: Not on file    Non-medical: Not on file  Tobacco Use  . Smoking status: Never Smoker  . Smokeless tobacco: Never Used  Substance and Sexual Activity  . Alcohol use: Not Currently    Comment: VERY RARE  . Drug use: No  . Sexual activity: Not on file  Lifestyle  . Physical activity    Days per week: Not on file    Minutes per session: Not on file  . Stress: Not on file  Relationships  . Social Herbalist on phone: Not on file    Gets together: Not on file    Attends religious service: Not on file    Active member of club or organization: Not on file    Attends meetings of clubs or organizations: Not on file    Relationship status: Not on file  . Intimate partner violence    Fear of current or ex partner: Not on file    Emotionally abused: Not on file    Physically abused: Not on file    Forced sexual activity: Not on file  Other Topics Concern  . Not on file  Social History Narrative  . Not on file   Family History  Problem Relation Age of Onset  . Heart disease Mother   . Stroke Father   . Colon cancer Paternal Uncle        42's  . Esophageal cancer Neg Hx   . Rectal cancer Neg Hx   . Stomach cancer Neg Hx     Objective: Office vital signs reviewed. BP 124/79   Pulse (!) 51   Temp 98.9 F (37.2 C) (Temporal)   Ht 5\' 9"  (1.753 m)   Wt 162 lb (73.5 kg)   SpO2 99%   BMI 23.92 kg/m   Physical Examination:  General: Awake, alert, well nourished, No acute distress HEENT: Normal, sclera white, MMM Cardio: regular rate and rhythm, S1S2 heard, no murmurs appreciated Pulm: clear to auscultation bilaterally, no wheezes, rhonchi or rales; normal work of breathing on room air Extremities: warm, well perfused, No edema, cyanosis or clubbing; +2 pulses  bilaterally Psych: Mood depressed, he is intermittently tearful.  Affect appropriate.  Conversive.  Good eye contact.  Assessment/ Plan: 63 y.o. male   1. Type 2 diabetes mellitus without complication, without long-term current use of insulin ([facility]) Under excellent control with A1c of 6.3 today.  Will obtain records from eye doctor. - Basic metabolic panel; Future - Bayer DCA Hb A1c Waived; Future - Bayer DCA Hb A1c Waived -  Basic metabolic panel  2. Hyperlipidemia associated with type 2 diabetes mellitus ([facility]) Continue statin and Vascepa  3. Hypertension associated with diabetes ([place]) Controlled - Basic metabolic panel; Future - Basic metabolic panel  4. Testosterone deficiency Plan for early morning testosterone level.  Patient will come out a different date for this.  UDS and controlled substance contract completed today.  Continue testosterone.  Not due for refills now but will be due in about 3 months.  Will be glad to continue this medication for this patient - Testosterone; Future - CBC; Future - ToxASSURE Select 13 ([place]), Urine - CBC  5. Controlled substance agreement signed - ToxASSURE Select 13 ([place]), Urine  6. Grief Has good supportive care.  No pharmacologic intervention needed at this time.  We will continue to follow along with this patient.   No orders of the defined types were placed in this encounter.  No orders of the defined types were placed in this encounter.    Janora Norlander, DO [place] [phone]

## 2019-08-21 LAB — FECAL OCCULT BLOOD, IMMUNOCHEMICAL: Fecal Occult Bld: NEGATIVE

## 2019-08-24 LAB — TOXASSURE SELECT 13 (MW), URINE

## 2019-10-21 ENCOUNTER — Other Ambulatory Visit: Payer: Self-pay | Admitting: Nurse Practitioner

## 2019-10-23 ENCOUNTER — Other Ambulatory Visit: Payer: Self-pay | Admitting: Family Medicine

## 2019-10-23 MED ORDER — TESTOSTERONE 50 MG/5GM (1%) TD GEL: 5.0000 g | Freq: Every day | TRANSDERMAL | 1 refills | Status: DC

## 2019-10-23 NOTE — Progress Notes (Signed)
The Narcotic Database has been reviewed.  There were no red flags.    

## 2019-10-23 NOTE — Telephone Encounter (Signed)
Testosterone level not yet collected. Please have patient come in sometime soon at 8am for lab draw prior to our next OV.  Refills sent

## 2019-10-23 NOTE — Telephone Encounter (Signed)
Patient informed to come in for lab work

## 2019-12-18 ENCOUNTER — Telehealth: Payer: Self-pay | Admitting: Family Medicine

## 2019-12-21 ENCOUNTER — Ambulatory Visit: Admitting: Family Medicine

## 2019-12-30 ENCOUNTER — Telehealth: Payer: Self-pay | Admitting: Family Medicine

## 2019-12-30 MED ORDER — HYDROCHLOROTHIAZIDE 12.5 MG PO CAPS: 12.5000 mg | ORAL_CAPSULE | Freq: Every day | ORAL | 0 refills | Status: DC

## 2019-12-30 MED ORDER — ICOSAPENT ETHYL 1 G PO CAPS: ORAL_CAPSULE | ORAL | 0 refills | Status: DC

## 2019-12-30 NOTE — Telephone Encounter (Signed)
Sent in. Patient aware

## 2019-12-30 NOTE — Telephone Encounter (Signed)
Yes, that's fine 

## 2019-12-30 NOTE — Telephone Encounter (Signed)
Patient has appt next week. Will you be willing to send this in for 90days

## 2020-01-04 ENCOUNTER — Other Ambulatory Visit: Payer: Self-pay

## 2020-01-05 ENCOUNTER — Encounter: Payer: Self-pay | Admitting: Family Medicine

## 2020-01-05 ENCOUNTER — Ambulatory Visit (INDEPENDENT_AMBULATORY_CARE_PROVIDER_SITE_OTHER): Admitting: Family Medicine

## 2020-01-05 VITALS — BP 120/78 | HR 54 | Temp 97.7°F | Ht 69.0 in | Wt 166.0 lb

## 2020-01-05 DIAGNOSIS — I10 Essential (primary) hypertension: Secondary | ICD-10-CM

## 2020-01-05 DIAGNOSIS — E349 Endocrine disorder, unspecified: Secondary | ICD-10-CM

## 2020-01-05 DIAGNOSIS — E1169 Type 2 diabetes mellitus with other specified complication: Secondary | ICD-10-CM | POA: Diagnosis not present

## 2020-01-05 DIAGNOSIS — E1159 Type 2 diabetes mellitus with other circulatory complications: Secondary | ICD-10-CM

## 2020-01-05 DIAGNOSIS — E785 Hyperlipidemia, unspecified: Secondary | ICD-10-CM | POA: Diagnosis not present

## 2020-01-05 DIAGNOSIS — E119 Type 2 diabetes mellitus without complications: Secondary | ICD-10-CM

## 2020-01-05 LAB — BAYER DCA HB A1C WAIVED: HB A1C (BAYER DCA - WAIVED): 6.5 % (ref <7.0)

## 2020-01-05 MED ORDER — ATORVASTATIN CALCIUM 20 MG PO TABS: ORAL_TABLET | ORAL | 3 refills | Status: DC

## 2020-01-05 MED ORDER — AMLODIPINE BESYLATE 5 MG PO TABS: 5.0000 mg | ORAL_TABLET | Freq: Every day | ORAL | 3 refills | Status: DC

## 2020-01-05 MED ORDER — ICOSAPENT ETHYL 1 G PO CAPS: ORAL_CAPSULE | ORAL | 3 refills | Status: DC

## 2020-01-05 MED ORDER — OLMESARTAN MEDOXOMIL 40 MG PO TABS: 40.0000 mg | ORAL_TABLET | Freq: Every day | ORAL | 3 refills | Status: DC

## 2020-01-05 MED ORDER — TESTOSTERONE 50 MG/5GM (1%) TD GEL: 5.0000 g | Freq: Every day | TRANSDERMAL | 1 refills | Status: DC

## 2020-01-05 MED ORDER — HYDROCHLOROTHIAZIDE 12.5 MG PO CAPS: 12.5000 mg | ORAL_CAPSULE | Freq: Every day | ORAL | 3 refills | Status: DC

## 2020-01-05 MED ORDER — METFORMIN HCL 500 MG PO TABS: 500.0000 mg | ORAL_TABLET | Freq: Every day | ORAL | 3 refills | Status: DC

## 2020-01-05 NOTE — Patient Instructions (Signed)

## 2020-01-05 NOTE — Progress Notes (Signed)
Subjective: CC:DM2, HTN, HLD  PCP: Janora Norlander, DO Terry [name] is a 64 y.o. male presenting to clinic today for:  1. Type 2 Diabetes w/ HTN, HLD:  Patient maintains a strict diet and tries to stay physically active with hiking.  He is compliant with Metformin, Benicar, atorvastatin and vascepa (but has been out for a few days due to pharmacy ordering).  Last eye exam: Has every 6 months with Mardene Speak at [facility].  He has known glaucoma Last foot exam: needs Last A1c:  Lab Results  Component Value Date   HGBA1C 6.3 08/20/2019   Nephropathy screen indicated?: on ARB Last flu, zoster and/or pneumovax:  Immunization History  Administered Date(s) Administered  . Influenza Split 10/13/2015  . Influenza Whole 08/01/2010  . Influenza,inj,Quad PF,6+ Mos 08/01/2016, 08/30/2017, 10/14/2018, 08/11/2019  . Pneumococcal Polysaccharide-23 08/20/2019  . Tdap 03/10/2008, 07/02/2018  . Zoster 01/11/2017    ROS: Denies dizziness, LOC, polyuria, polydipsia, unintended weight loss/gain, foot ulcerations, numbness or tingling in extremities, shortness of breath or chest pain.  2. Hypogonadism/ BPH Uses testosterone daily.  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  He is exercising regularly.    ROS: Per HPI  Allergies  Allergen Reactions  . Penicillins Nausea And Vomiting   Past Medical History:  Diagnosis Date  . Diabetes mellitus without complication ([place])   . Elevated blood sugar   . Esophageal reflux   . Essential hypertension, benign   . Hyperplasia of prostate   . Other and unspecified hyperlipidemia   . Other testicular hypofunction   . Sleep apnea    CPAP   . Unspecified glaucoma(365.9)   . Unspecified hemorrhoids without mention of complication     Current Outpatient Medications:  .  amLODipine (NORVASC) 5 MG tablet, TAKE 1 TABLET DAILY, Disp: 90 tablet, Rfl: 3 .  aspirin 81 MG EC tablet, Take 81 mg by mouth  daily.  , Disp: , Rfl:  .  atorvastatin (LIPITOR) 20 MG tablet, TAKE 1 TABLET DAILY AS DIRECTED (MUST BE SEEN BEFORE NEXT REFILL), Disp: 90 tablet, Rfl: 3 .  bimatoprost (LUMIGAN) 0.01 % SOLN, 1 drop at bedtime., Disp: , Rfl:  .  Cholecalciferol (VITAMIN D3) 5000 units CAPS, Take 1 capsule by mouth 3 (three) times a week., Disp: , Rfl:  .  Cinnamon 500 MG TABS, Take 1 tablet by mouth 2 (two) times daily., Disp: , Rfl:  .  glucose blood (FREESTYLE LITE) test strip, USE TO TEST BLOOD SUGAR TWICE A DAY, Disp: 300 each, Rfl: 2 .  hydrochlorothiazide (MICROZIDE) 12.5 MG capsule, Take 1 capsule (12.5 mg total) by mouth daily., Disp: 90 capsule, Rfl: 0 .  icosapent Ethyl (VASCEPA) 1 g capsule, TAKE 2 CAPSULES TWICE A DAY (DISCONTINUE FENOFIBRATE AND LOVAZA), Disp: 360 capsule, Rfl: 0 .  metFORMIN (GLUCOPHAGE) 500 MG tablet, TAKE 1 TABLET DAILY WITH BREAKFAST, Disp: 90 tablet, Rfl: 3 .  olmesartan (BENICAR) 40 MG tablet, TAKE 1 TABLET DAILY, Disp: 90 tablet, Rfl: 1 .  OVER THE COUNTER MEDICATION, Allergy relief otc, nasal spray, fluticasone spray, Disp: , Rfl:  .  testosterone (ANDROGEL) 50 MG/5GM (1%) GEL, APPLY 1 TUBE ( 5 GRAMS ) ONTO THE SKIN DAILY, Disp: 450 g, Rfl: 1 .  testosterone (ANDROGEL) 50 MG/5GM (1%) GEL, Place 5 g onto the skin daily., Disp: 450 g, Rfl: 1 .  timolol (BETIMOL) 0.25 % ophthalmic solution, Place 1 drop into both eyes 2 (two) times daily. ,  Disp: , Rfl:  Social History   Socioeconomic History  . Marital status: Widowed    Spouse name: Not on file  . Number of children: Not on file  . Years of education: Not on file  . Highest education level: Not on file  Occupational History  . Not on file  Tobacco Use  . Smoking status: Never Smoker  . Smokeless tobacco: Never Used  Substance and Sexual Activity  . Alcohol use: Not Currently    Comment: VERY RARE  . Drug use: No  . Sexual activity: Not on file  Other Topics Concern  . Not on file  Social History Narrative  .  Not on file   Social Determinants of Health   Financial Resource Strain:   . Difficulty of Paying Living Expenses: Not on file  Food Insecurity:   . Worried About Charity fundraiser in the Last Year: Not on file  . Ran Out of Food in the Last Year: Not on file  Transportation Needs:   . Lack of Transportation (Medical): Not on file  . Lack of Transportation (Non-Medical): Not on file  Physical Activity:   . Days of Exercise per Week: Not on file  . Minutes of Exercise per Session: Not on file  Stress:   . Feeling of Stress : Not on file  Social Connections:   . Frequency of Communication with Friends and Family: Not on file  . Frequency of Social Gatherings with Friends and Family: Not on file  . Attends Religious Services: Not on file  . Active Member of Clubs or Organizations: Not on file  . Attends Archivist Meetings: Not on file  . Marital Status: Not on file  Intimate Partner Violence:   . Fear of Current or Ex-Partner: Not on file  . Emotionally Abused: Not on file  . Physically Abused: Not on file  . Sexually Abused: Not on file   Family History  Problem Relation Age of Onset  . Heart disease Mother   . Stroke Father   . Colon cancer Paternal Uncle        75's  . Esophageal cancer Neg Hx   . Rectal cancer Neg Hx   . Stomach cancer Neg Hx     Objective: Office vital signs reviewed. BP 120/78   Pulse (!) 54   Temp 97.7 F (36.5 C) (Temporal)   Ht '5\' 9"'$  (1.753 m)   Wt 166 lb (75.3 kg)   SpO2 98%   BMI 24.51 kg/m   Physical Examination:  General: Awake, alert, well nourished, No acute distress HEENT: Normal, sclera white, MMM Cardio: regular rate and rhythm, S1S2 heard, no murmurs appreciated Pulm: clear to auscultation bilaterally, no wheezes, rhonchi or rales; normal work of breathing on room air Extremities: warm, well perfused, No edema, cyanosis or clubbing; +2 pulses bilaterally Psych: Mood stable, speech normal. Pleasant, interactive  Neuro: see DM foot below Diabetic Foot Exam - Simple   Simple Foot Form Diabetic Foot exam was performed with the following findings: Yes 01/05/2020  9:10 AM  Visual Inspection No deformities, no ulcerations, no other skin breakdown bilaterally: Yes Sensation Testing Intact to touch and monofilament testing bilaterally: Yes Pulse Check Posterior Tibialis and Dorsalis pulse intact bilaterally: Yes Comments He does have a slight area of skin breakdown along the right dorsum between the digits 4 and 5.  This is an area that he had picked at.  No gross ulceration    Assessment/ Plan: 64  y.o. male   1. Type 2 diabetes mellitus without complication, without long-term current use of insulin (HCC) Under excellent control with A1c of 6.5.  Continue current regimen. - Bayer DCA Hb A1c Waived - CMP14+EGFR - metFORMIN (GLUCOPHAGE) 500 MG tablet; Take 1 tablet (500 mg total) by mouth daily with breakfast.  Dispense: 90 tablet; Refill: 3  2. Hyperlipidemia associated with type 2 diabetes mellitus (HCC) Check fasting lipid panel.  He is been out of Vascepa for 4 days.  This is been renewed for 1 year - CMP14+EGFR - Lipid Panel - icosapent Ethyl (VASCEPA) 1 g capsule; TAKE 2 CAPSULES TWICE A DAY  Dispense: 360 capsule; Refill: 3 - atorvastatin (LIPITOR) 20 MG tablet; TAKE 1 TABLET DAILY AS DIRECTED  Dispense: 90 tablet; Refill: 3  3. Hypertension associated with diabetes ([place]) Controlled.  Continue current regimen - CMP14+EGFR - olmesartan (BENICAR) 40 MG tablet; Take 1 tablet (40 mg total) by mouth daily.  Dispense: 90 tablet; Refill: 3 - hydrochlorothiazide (MICROZIDE) 12.5 MG capsule; Take 1 capsule (12.5 mg total) by mouth daily.  Dispense: 90 capsule; Refill: 3 - amLODipine (NORVASC) 5 MG tablet; Take 1 tablet (5 mg total) by mouth daily.  Dispense: 90 tablet; Refill: 3  4. Testosterone deficiency Stable.  Check testosterone.  Testosterone medication sent to pharmacy.  Follow-up in 6  months, sooner if needed. The Narcotic Database has been reviewed.  There were no red flags.   - CBC - Testosterone,Free and Total - testosterone (ANDROGEL) 50 MG/5GM (1%) GEL; Place 5 g onto the skin daily.  Dispense: 450 g; Refill: 1   No orders of the defined types were placed in this encounter.  No orders of the defined types were placed in this encounter.    Janora Norlander, DO [place] [phone]

## 2020-01-06 LAB — CBC
Hematocrit: 44 % (ref 37.5–51.0)
Hemoglobin: 15 g/dL (ref 13.0–17.7)
MCH: 30.7 pg (ref 26.6–33.0)
MCHC: 34.1 g/dL (ref 31.5–35.7)
MCV: 90 fL (ref 79–97)
Platelets: 278 10*3/uL (ref 150–450)
RBC: 4.89 x10E6/uL (ref 4.14–5.80)
RDW: 13.1 % (ref 11.6–15.4)
WBC: 7.6 10*3/uL (ref 3.4–10.8)

## 2020-01-06 LAB — CMP14+EGFR
ALT: 33 IU/L (ref 0–44)
AST: 25 IU/L (ref 0–40)
Albumin/Globulin Ratio: 2.2 (ref 1.2–2.2)
Albumin: 5.1 g/dL — ABNORMAL HIGH (ref 3.8–4.8)
Alkaline Phosphatase: 51 IU/L (ref 39–117)
BUN/Creatinine Ratio: 21 (ref 10–24)
BUN: 19 mg/dL (ref 8–27)
Bilirubin Total: 0.6 mg/dL (ref 0.0–1.2)
CO2: 22 mmol/L (ref 20–29)
Calcium: 10.4 mg/dL — ABNORMAL HIGH (ref 8.6–10.2)
Chloride: 97 mmol/L (ref 96–106)
Creatinine, Ser: 0.92 mg/dL (ref 0.76–1.27)
GFR calc Af Amer: 102 mL/min/{1.73_m2} (ref >59)
GFR calc non Af Amer: 88 mL/min/{1.73_m2} (ref >59)
Globulin, Total: 2.3 g/dL (ref 1.5–4.5)
Glucose: 141 mg/dL — ABNORMAL HIGH (ref 65–99)
Potassium: 4.3 mmol/L (ref 3.5–5.2)
Sodium: 138 mmol/L (ref 134–144)
Total Protein: 7.4 g/dL (ref 6.0–8.5)

## 2020-01-06 LAB — LIPID PANEL
Chol/HDL Ratio: 2.7 ratio (ref 0.0–5.0)
Cholesterol, Total: 154 mg/dL (ref 100–199)
HDL: 58 mg/dL (ref >39)
LDL Chol Calc (NIH): 69 mg/dL (ref 0–99)
Triglycerides: 159 mg/dL — ABNORMAL HIGH (ref 0–149)
VLDL Cholesterol Cal: 27 mg/dL (ref 5–40)

## 2020-01-06 LAB — TESTOSTERONE,FREE AND TOTAL
Testosterone, Free: 12.7 pg/mL (ref 6.6–18.1)
Testosterone: 497 ng/dL (ref 264–916)

## 2020-02-17 ENCOUNTER — Other Ambulatory Visit: Payer: Self-pay | Admitting: Family Medicine

## 2020-06-01 ENCOUNTER — Encounter: Payer: Self-pay | Admitting: Internal Medicine

## 2020-06-01 ENCOUNTER — Other Ambulatory Visit: Payer: Self-pay

## 2020-06-01 ENCOUNTER — Ambulatory Visit (INDEPENDENT_AMBULATORY_CARE_PROVIDER_SITE_OTHER): Admitting: Internal Medicine

## 2020-06-01 VITALS — BP 118/72 | HR 57 | Temp 98.3°F | Ht 69.0 in | Wt 167.6 lb

## 2020-06-01 DIAGNOSIS — K219 Gastro-esophageal reflux disease without esophagitis: Secondary | ICD-10-CM | POA: Diagnosis not present

## 2020-06-01 DIAGNOSIS — G4733 Obstructive sleep apnea (adult) (pediatric): Secondary | ICD-10-CM

## 2020-06-01 NOTE — Patient Instructions (Signed)
Order- schedule home sleep test   Dx OSA  Please call us about 2 weeks after your sleep test, to see if results and recommendations are ready yet. If appropriate, we can stop CPAP or make suggestions based on the results before we see you next.

## 2020-06-01 NOTE — Progress Notes (Signed)
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 Denies other medical issues at this time.   Observations/Objective: Download 70% compliance, AHI 2.0/ hr   Occasional short nights, not missing completely.  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 He is trying to cope. Has lost weight. Hiking a lot with friends. We discussed possibility that he might no longer need CPAP. He says sleep quality and daytime alertness are similar with and without CPAP.  ROS-see HPI + = positive Constitutional:    weight loss, night sweats, fevers, chills, fatigue, lassitude. HEENT:    headaches, difficulty swallowing, tooth/dental problems, sore throat,       sneezing, itching, ear ache, +nasal congestion, post nasal drip, snoring CV:    chest pain, orthopnea, PND, swelling in lower extremities, anasarca,                                                     dizziness, palpitations Resp:   shortness of breath with exertion or at rest.                productive cough,   +non-productive cough, coughing up of blood.              change in color of mucus.  wheezing.   Skin:     rash or lesions. GI:  No-   heartburn, indigestion, abdominal pain, nausea, vomiting, diarrhea,                 change in bowel habits, loss of appetite GU: dysuria, change in color of urine, no urgency or frequency.   flank pain. MS:   joint pain, stiffness, decreased range of motion, back pain. Neuro-     nothing unusual Psych:  change in mood or affect.  depression or anxiety.   memory loss.  OBJ- Physical Exam           General- Alert, Oriented, Affect-appropriate, Distress- none acute, +lean Skin- rash-none, lesions- none, excoriation- none Lymphadenopathy- none Head- atraumatic            Eyes- Gross vision intact, PERRLA, conjunctivae and secretions clear            Ears- Hearing, canals-normal            Nose- Clear, Septal dev+, mucus, polyps, erosion, perforation             Throat- Mallampati IV , mucosa clear , drainage- none, tonsils- atrophic, own teeth Neck- flexible , trachea midline, no stridor , thyroid nl, carotid no bruit Chest - symmetrical excursion , unlabored           Heart/CV- RRR ,  no murmur , no gallop  , no rub, nl s1 s2                           - JVD- none , edema- none, stasis changes- none, varices- none           Lung- clear to P&A, wheeze- none, cough- none , dullness-none, rub- none,           Chest wall-  Abd-  Br/ Gen/ Rectal- Not done, not indicated Extrem- cyanosis- none, clubbing, none, atrophy- none, strength- nl Neuro- grossly intact to observation

## 2020-06-05 NOTE — Assessment & Plan Note (Signed)
Weight is down 17 lbs from sleep study in 2016. Plan- HST to see if he still needs CPAP or other intervention for OSA

## 2020-06-05 NOTE — Assessment & Plan Note (Signed)
Understands reflux precautions. Symptoms have eased.

## 2020-06-09 ENCOUNTER — Encounter: Payer: Self-pay | Admitting: Internal Medicine

## 2020-07-11 NOTE — Progress Notes (Signed)
Subjective: CC:DM2, HTN, HLD  PCP: Janora Norlander, DO ZYS:[id number] Band is a 64 y.o. male presenting to clinic today for:  1. Type 2 Diabetes w/ HTN, HLD:  Continues to stay physically active and hikes.  He has lost about 20 pounds and continues to work on weight loss.  He is compliant with Metformin, Benicar, atorvastatin and vascepa.  Blood sugars have been under good control at home ranging anywhere from low 100s to 140s typically.  Rare 150.  Blood pressures have been under fairly good control as well ranging anywhere from 120s to 140s over 70s.  He is maintaining his weight around 161 to 163 pounds.  Last eye exam: Has every 6 months with Mardene Speak at [facility].  He has known glaucoma Last foot exam: UTD Last A1c:  Lab Results  Component Value Date   HGBA1C 6.5 01/05/2020   Nephropathy screen indicated?: on ARB Last flu, zoster and/or pneumovax:  Immunization History  Administered Date(s) Administered  . Influenza Split 10/13/2015  . Influenza Whole 08/01/2010  . Influenza,inj,Quad PF,6+ Mos 08/01/2016, 08/30/2017, 10/14/2018, 08/11/2019  . Moderna SARS-COVID-2 Vaccination 01/05/2020, 02/02/2020  . Pneumococcal Polysaccharide-23 08/20/2019  . Tdap 03/10/2008, 07/02/2018  . Zoster 01/11/2017    ROS: No reports of chest pain, dizziness, foot ulcerations.  2. Hypogonadism/ BPH Uses testosterone daily topically.  He exercises regularly as above.  3.  Hemorrhoid Patient was noted to have a hemorrhoid on his last colonoscopy.  He is interested in going ahead and getting this removed.  It typically is not bothersome unless he has wiped too hard and then it becomes inflamed for a couple of days before resolving.  Does not report any hematochezia.  4.  Right knee clicking Patient reports that he has occasional right knee clicking that seems to be more evident when he is physically active.  He denies any instability or sensation of giving out of the knee.  Does not  report any overt pain.  He is wondering if glucosamine would be helpful.    ROS: Per HPI  Allergies  Allergen Reactions  . Penicillins Nausea And Vomiting   Past Medical History:  Diagnosis Date  . Diabetes mellitus without complication ([place])   . Elevated blood sugar   . Esophageal reflux   . Essential hypertension, benign   . Hyperplasia of prostate   . Other and unspecified hyperlipidemia   . Other testicular hypofunction   . Sleep apnea    CPAP   . Unspecified glaucoma(365.9)   . Unspecified hemorrhoids without mention of complication     Current Outpatient Medications:  .  amLODipine (NORVASC) 5 MG tablet, Take 1 tablet (5 mg total) by mouth daily., Disp: 90 tablet, Rfl: 3 .  aspirin 81 MG EC tablet, Take 81 mg by mouth daily.  , Disp: , Rfl:  .  atorvastatin (LIPITOR) 20 MG tablet, TAKE 1 TABLET DAILY AS DIRECTED, Disp: 90 tablet, Rfl: 3 .  bimatoprost (LUMIGAN) 0.01 % SOLN, 1 drop at bedtime., Disp: , Rfl:  .  Cholecalciferol (VITAMIN D3) 5000 units CAPS, Take 1 capsule by mouth 3 (three) times a week., Disp: , Rfl:  .  Cinnamon 500 MG TABS, Take 1 tablet by mouth 2 (two) times daily., Disp: , Rfl:  .  glucose blood (FREESTYLE LITE) test strip, USE TO TEST BLOOD SUGAR TWICE A DAY R73.9, Disp: 200 each, Rfl: 3 .  hydrochlorothiazide (MICROZIDE) 12.5 MG capsule, Take 1 capsule (12.5 mg total) by mouth daily., Disp: 90  capsule, Rfl: 3 .  icosapent Ethyl (VASCEPA) 1 g capsule, TAKE 2 CAPSULES TWICE A DAY, Disp: 360 capsule, Rfl: 3 .  metFORMIN (GLUCOPHAGE) 500 MG tablet, Take 1 tablet (500 mg total) by mouth daily with breakfast., Disp: 90 tablet, Rfl: 3 .  olmesartan (BENICAR) 40 MG tablet, Take 1 tablet (40 mg total) by mouth daily., Disp: 90 tablet, Rfl: 3 .  OVER THE COUNTER MEDICATION, Allergy relief otc, nasal spray, fluticasone spray, Disp: , Rfl:  .  testosterone (ANDROGEL) 50 MG/5GM (1%) GEL, Place 5 g onto the skin daily., Disp: 450 g, Rfl: 1 .  timolol (BETIMOL)  0.25 % ophthalmic solution, Place 1 drop into both eyes 2 (two) times daily. , Disp: , Rfl:  Social History   Socioeconomic History  . Marital status: Widowed    Spouse name: Not on file  . Number of children: Not on file  . Years of education: Not on file  . Highest education level: Not on file  Occupational History  . Not on file  Tobacco Use  . Smoking status: Never Smoker  . Smokeless tobacco: Never Used  Vaping Use  . Vaping Use: Never used  Substance and Sexual Activity  . Alcohol use: Not Currently    Comment: VERY RARE  . Drug use: No  . Sexual activity: Not Currently  Other Topics Concern  . Not on file  Social History Narrative  . Not on file   Social Determinants of Health   Financial Resource Strain:   . Difficulty of Paying Living Expenses: Not on file  Food Insecurity:   . Worried About Charity fundraiser in the Last Year: Not on file  . Ran Out of Food in the Last Year: Not on file  Transportation Needs:   . Lack of Transportation (Medical): Not on file  . Lack of Transportation (Non-Medical): Not on file  Physical Activity:   . Days of Exercise per Week: Not on file  . Minutes of Exercise per Session: Not on file  Stress:   . Feeling of Stress : Not on file  Social Connections:   . Frequency of Communication with Friends and Family: Not on file  . Frequency of Social Gatherings with Friends and Family: Not on file  . Attends Religious Services: Not on file  . Active Member of Clubs or Organizations: Not on file  . Attends Archivist Meetings: Not on file  . Marital Status: Not on file  Intimate Partner Violence:   . Fear of Current or Ex-Partner: Not on file  . Emotionally Abused: Not on file  . Physically Abused: Not on file  . Sexually Abused: Not on file   Family History  Problem Relation Age of Onset  . Heart disease Mother   . Stroke Father   . Colon cancer Paternal Uncle        2's  . Esophageal cancer Neg Hx   . Rectal  cancer Neg Hx   . Stomach cancer Neg Hx     Objective: Office vital signs reviewed. BP 138/85   Pulse (!) 48   Temp (!) 97.4 F (36.3 C) (Temporal)   Ht $R'5\' 9"'aP$  (1.753 m)   Wt 166 lb (75.3 kg)   SpO2 98%   BMI 24.51 kg/m   Physical Examination:  General: Awake, alert, well nourished, No acute distress HEENT: Normal, sclera white, MMM Cardio: bradycardic with regular rhythm.  S1S2 heard, no murmurs appreciated Pulm: clear to auscultation bilaterally, no  wheezes, rhonchi or rales; normal work of breathing on room air Extremities: warm, well perfused, No edema, cyanosis or clubbing; +2 pulses bilaterally Psych: Mood stable, speech normal. Pleasant, interactive MSK: Ambulating independently.  He has audible crepitus in the right knee.  No gross knee swelling, fluid collection or erythema  Assessment/ Plan: 64 y.o. male   1. Type 2 diabetes mellitus without complication, without long-term current use of insulin (HCC) Well-controlled.  Continue Metformin.  If is able to get below 6, can discontinue Metformin.  He has had his eye exam with Dr. Bing Plume and we will obtain these records.  He can follow-up in 6 months, sooner if needed - Bayer DCA Hb A1c Waived  2. Hyperlipidemia associated with type 2 diabetes mellitus (HCC) Continue statin - CMP14+EGFR  3. Hypertension associated with diabetes ([place]) Controlled - CMP14+EGFR  4. Testosterone deficiency Check levels.  AndroGel renewed.  CSA and UDS obtained as per office policy.  Follow-up in 6 months, sooner if needed - Testosterone,Free and Total - PSA - CBC - testosterone (ANDROGEL) 50 MG/5GM (1%) GEL; Place 5 g onto the skin daily.  Dispense: 450 g; Refill: 1  5. Controlled substance agreement signed - ToxASSURE Select 13 (MW), Urine  6. Crepitus of joint of right knee ?  Osteoarthritic versus due to meniscal degeneration.  Regardless, he is not having any pain.  I think that use of glucosamine-chondroitin and turmeric are  fine.  If he becomes symptomatic, low threshold to have him evaluated by orthopedics.  7. Hemorrhoids, unspecified hemorrhoid type Referral back to his gastroenterologist for banding placed.  Follow-up as needed on this issue - Ambulatory referral to Gastroenterology   No orders of the defined types were placed in this encounter.  No orders of the defined types were placed in this encounter.    Janora Norlander, DO [place] [phone]

## 2020-07-12 ENCOUNTER — Other Ambulatory Visit: Payer: Self-pay

## 2020-07-12 ENCOUNTER — Encounter: Payer: Self-pay | Admitting: Family Medicine

## 2020-07-12 ENCOUNTER — Ambulatory Visit (INDEPENDENT_AMBULATORY_CARE_PROVIDER_SITE_OTHER): Admitting: Family Medicine

## 2020-07-12 VITALS — BP 138/85 | HR 48 | Temp 97.4°F | Ht 69.0 in | Wt 166.0 lb

## 2020-07-12 DIAGNOSIS — E1159 Type 2 diabetes mellitus with other circulatory complications: Secondary | ICD-10-CM

## 2020-07-12 DIAGNOSIS — M238X1 Other internal derangements of right knee: Secondary | ICD-10-CM

## 2020-07-12 DIAGNOSIS — E1169 Type 2 diabetes mellitus with other specified complication: Secondary | ICD-10-CM

## 2020-07-12 DIAGNOSIS — E349 Endocrine disorder, unspecified: Secondary | ICD-10-CM | POA: Diagnosis not present

## 2020-07-12 DIAGNOSIS — Z79899 Other long term (current) drug therapy: Secondary | ICD-10-CM

## 2020-07-12 DIAGNOSIS — E119 Type 2 diabetes mellitus without complications: Secondary | ICD-10-CM

## 2020-07-12 DIAGNOSIS — K649 Unspecified hemorrhoids: Secondary | ICD-10-CM

## 2020-07-12 DIAGNOSIS — E785 Hyperlipidemia, unspecified: Secondary | ICD-10-CM

## 2020-07-12 DIAGNOSIS — I10 Essential (primary) hypertension: Secondary | ICD-10-CM

## 2020-07-12 LAB — BAYER DCA HB A1C WAIVED: HB A1C (BAYER DCA - WAIVED): 6.6 % (ref <7.0)

## 2020-07-12 MED ORDER — TESTOSTERONE 50 MG/5GM (1%) TD GEL: 5.0000 g | Freq: Every day | TRANSDERMAL | 1 refills | Status: DC

## 2020-07-12 NOTE — Patient Instructions (Signed)
A1c is 6.6 today As we discussed, if we can get your sugars below A1c 6.0, we can stop the metformin and see how things go. At this point, your sugar is controlled and no medication adjustments will be made.  You had labs performed today.  You will be contacted with the results of the labs once they are available, usually in the next 3 business days for routine lab work.  If you have an active my chart account, they will be released to your MyChart.  If you prefer to have these labs released to you via telephone, please let us know.  If you had a pap smear or biopsy performed, expect to be contacted in about 7-10 days.

## 2020-07-13 LAB — CMP14+EGFR
ALT: 87 IU/L — ABNORMAL HIGH (ref 0–44)
AST: 34 IU/L (ref 0–40)
Albumin/Globulin Ratio: 2 (ref 1.2–2.2)
Albumin: 4.7 g/dL (ref 3.8–4.8)
Alkaline Phosphatase: 48 IU/L (ref 48–121)
BUN/Creatinine Ratio: 22 (ref 10–24)
BUN: 19 mg/dL (ref 8–27)
Bilirubin Total: 0.5 mg/dL (ref 0.0–1.2)
CO2: 24 mmol/L (ref 20–29)
Calcium: 9.9 mg/dL (ref 8.6–10.2)
Chloride: 100 mmol/L (ref 96–106)
Creatinine, Ser: 0.87 mg/dL (ref 0.76–1.27)
GFR calc Af Amer: 105 mL/min/{1.73_m2} (ref >59)
GFR calc non Af Amer: 91 mL/min/{1.73_m2} (ref >59)
Globulin, Total: 2.3 g/dL (ref 1.5–4.5)
Glucose: 148 mg/dL — ABNORMAL HIGH (ref 65–99)
Potassium: 4.5 mmol/L (ref 3.5–5.2)
Sodium: 138 mmol/L (ref 134–144)
Total Protein: 7 g/dL (ref 6.0–8.5)

## 2020-07-13 LAB — CBC
Hematocrit: 44.1 % (ref 37.5–51.0)
Hemoglobin: 14.7 g/dL (ref 13.0–17.7)
MCH: 30.2 pg (ref 26.6–33.0)
MCHC: 33.3 g/dL (ref 31.5–35.7)
MCV: 91 fL (ref 79–97)
Platelets: 240 10*3/uL (ref 150–450)
RBC: 4.87 x10E6/uL (ref 4.14–5.80)
RDW: 13.3 % (ref 11.6–15.4)
WBC: 5.8 10*3/uL (ref 3.4–10.8)

## 2020-07-13 LAB — PSA: Prostate Specific Ag, Serum: 2.8 ng/mL (ref 0.0–4.0)

## 2020-07-13 LAB — TESTOSTERONE,FREE AND TOTAL
Testosterone, Free: 20.9 pg/mL — ABNORMAL HIGH (ref 6.6–18.1)
Testosterone: 737 ng/dL (ref 264–916)

## 2020-07-14 ENCOUNTER — Telehealth: Payer: Self-pay | Admitting: Family Medicine

## 2020-07-14 LAB — TOXASSURE SELECT 13 (MW), URINE

## 2020-07-18 ENCOUNTER — Ambulatory Visit

## 2020-07-18 ENCOUNTER — Other Ambulatory Visit: Payer: Self-pay

## 2020-07-18 DIAGNOSIS — G4733 Obstructive sleep apnea (adult) (pediatric): Secondary | ICD-10-CM | POA: Diagnosis not present

## 2020-07-19 DIAGNOSIS — G4733 Obstructive sleep apnea (adult) (pediatric): Secondary | ICD-10-CM

## 2020-07-22 NOTE — Telephone Encounter (Signed)
Per result note patient is aware of results

## 2020-08-12 ENCOUNTER — Ambulatory Visit

## 2020-08-19 ENCOUNTER — Ambulatory Visit (INDEPENDENT_AMBULATORY_CARE_PROVIDER_SITE_OTHER)

## 2020-08-19 ENCOUNTER — Other Ambulatory Visit: Payer: Self-pay

## 2020-08-19 DIAGNOSIS — Z23 Encounter for immunization: Secondary | ICD-10-CM

## 2020-08-22 ENCOUNTER — Encounter: Payer: Self-pay | Admitting: *Deleted

## 2020-08-23 ENCOUNTER — Telehealth: Payer: Self-pay | Admitting: Family Medicine

## 2020-08-23 NOTE — Telephone Encounter (Signed)
Would contact the pharmacy to see if they are substituting with a different manufacturer.  They should be able to give a new supply for the remaining pills he has left and should substitute for future refills as well

## 2020-08-23 NOTE — Telephone Encounter (Signed)
Pt says that he has a medicine (hydrochlorothiazide) that has been recalled by the manufacture [employer]) and has two more refills that are [facility] to [facility] home and wants to know what he needs to do about the refills coming to him and the remainder of the medication that he has left

## 2020-08-23 NOTE — Telephone Encounter (Signed)
Patient aware and verbalizes understanding. 

## 2020-08-31 ENCOUNTER — Telehealth: Payer: Self-pay | Admitting: Internal Medicine

## 2020-08-31 NOTE — Telephone Encounter (Signed)
LMTCB x1 for pt.  

## 2020-09-01 NOTE — Telephone Encounter (Signed)
I have updated pt's appt notes changing it to a televisit. Called and spoke with pt letting him know this had been done and he verbalized understanding. Nothing further needed.

## 2020-09-01 NOTE — Telephone Encounter (Signed)
Ok to be a phone visit

## 2020-09-01 NOTE — Telephone Encounter (Signed)
Dr. Annamaria Boots, please advise if pt's 09/05/20 OV with you can be a telephone visit for convenience.

## 2020-09-05 ENCOUNTER — Ambulatory Visit (INDEPENDENT_AMBULATORY_CARE_PROVIDER_SITE_OTHER): Admitting: Internal Medicine

## 2020-09-05 ENCOUNTER — Other Ambulatory Visit: Payer: Self-pay

## 2020-09-05 DIAGNOSIS — J3089 Other allergic rhinitis: Secondary | ICD-10-CM | POA: Diagnosis not present

## 2020-09-05 DIAGNOSIS — J302 Other seasonal allergic rhinitis: Secondary | ICD-10-CM | POA: Diagnosis not present

## 2020-09-05 DIAGNOSIS — G4733 Obstructive sleep apnea (adult) (pediatric): Secondary | ICD-10-CM | POA: Diagnosis not present

## 2020-09-05 NOTE — Progress Notes (Signed)
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 He is trying to cope. Has lost weight. Hiking a lot with friends. We discussed possibility that he might no longer need CPAP. He says sleep quality and daytime alertness are similar with and without CPAP.   09/05/20- Virtual Visit via Telephone Note  I connected with Terry [name] on 09/05/20 at  2:30 PM EDT by telephone and verified that I am speaking with the correct person using two identifiers.  Location: Patient: H Provider: O   I discussed the limitations, risks, security and privacy concerns of performing an evaluation and management service by telephone and the availability of in person appointments. I also discussed with the patient that there may be a patient responsible charge related to this service. The patient expressed understanding and agreed to proceed.   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 So far he his being told that he is not snoring, with no witnessed apnea. We agreed to stop CPAP. He will return in a year to reassess.   Observations/Objective:   Assessment and Plan: OSA- mild  Plan- dc  CPAP  Follow Up Instructions: 1 year   I discussed the assessment and treatment plan with the patient. The patient was provided an opportunity to ask questions and all were answered. The patient agreed with the plan and demonstrated an understanding of the instructions.   The patient was advised to call back or seek an in-person evaluation if the symptoms worsen or if the condition fails to improve as anticipated.  I provided 15 minutes of non-face-to-face time during this encounter.   Baird Lyons, MD      ROS-see HPI + = positive Constitutional:    weight loss, night sweats, fevers, chills, fatigue, lassitude. HEENT:    headaches, difficulty swallowing, tooth/dental problems, sore throat,       sneezing, itching, ear ache, +nasal congestion, post nasal drip, snoring CV:    chest pain, orthopnea, PND, swelling in lower extremities, anasarca,                                                     dizziness, palpitations Resp:   shortness of breath with exertion or at rest.                productive cough,   +non-productive cough, coughing up of blood.              change in color of mucus.  wheezing.   Skin:    rash or lesions. GI:  No-   heartburn, indigestion, abdominal pain, nausea, vomiting, diarrhea,  change in bowel habits, loss of appetite GU: dysuria, change in color of urine, no urgency or frequency.   flank pain. MS:   joint pain, stiffness, decreased range of motion, back pain. Neuro-     nothing unusual Psych:  change in mood or affect.  depression or anxiety.   memory loss.  OBJ- Physical Exam           General- Alert, Oriented, Affect-appropriate, Distress- none acute, +lean Skin- rash-none, lesions- none, excoriation- none Lymphadenopathy- none Head- atraumatic            Eyes- Gross vision intact, PERRLA, conjunctivae and secretions clear            Ears- Hearing, canals-normal            Nose- Clear, Septal dev+, mucus, polyps, erosion,  perforation             Throat- Mallampati IV , mucosa clear , drainage- none, tonsils- atrophic, own teeth Neck- flexible , trachea midline, no stridor , thyroid nl, carotid no bruit Chest - symmetrical excursion , unlabored           Heart/CV- RRR , no murmur , no gallop  , no rub, nl s1 s2                           - JVD- none , edema- none, stasis changes- none, varices- none           Lung- clear to P&A, wheeze- none, cough- none , dullness-none, rub- none,           Chest wall-  Abd-  Br/ Gen/ Rectal- Not done, not indicated Extrem- cyanosis- none, clubbing, none, atrophy- none, strength- nl Neuro- grossly intact to observation

## 2020-09-05 NOTE — Patient Instructions (Signed)
Your most recent sleep study shows only mild sleep apnea, in a range where we can just watch off CPAP. . If you can keep your weight down, you should be able to stay like this. If you find you are snoring more, stopping breathing in your sleep, or find daytime sleepiness intrusive, then we can look at options.   We will see you in a year to see how you have done.  Please call if we can help

## 2020-09-16 ENCOUNTER — Encounter: Payer: Self-pay | Admitting: Internal Medicine

## 2020-09-16 ENCOUNTER — Ambulatory Visit (INDEPENDENT_AMBULATORY_CARE_PROVIDER_SITE_OTHER): Admitting: Internal Medicine

## 2020-09-16 VITALS — BP 108/76 | HR 92 | Ht 69.0 in | Wt 169.0 lb

## 2020-09-16 DIAGNOSIS — R151 Fecal smearing: Secondary | ICD-10-CM

## 2020-09-16 DIAGNOSIS — K648 Other hemorrhoids: Secondary | ICD-10-CM

## 2020-09-16 NOTE — Patient Instructions (Signed)
If you are age 64 or older, your body mass index should be between 23-30. Your Body mass index is 24.96 kg/m. If this is out of the aforementioned range listed, please consider follow up with your Primary Care Provider.  If you are age 55 or younger, your body mass index should be between 19-25. Your Body mass index is 24.96 kg/m. If this is out of the aformentioned range listed, please consider follow up with your Primary Care Provider.   HEMORRHOID BANDING PROCEDURE    FOLLOW-UP CARE   1. The procedure you have had should have been relatively painless since the banding of the area involved does not have nerve endings and there is no pain sensation.  The rubber band cuts off the blood supply to the hemorrhoid and the band may fall off as soon as 48 hours after the banding (the band may occasionally be seen in the toilet bowl following a bowel movement). You may notice a temporary feeling of fullness in the rectum which should respond adequately to plain Tylenol or Motrin.  2. Following the banding, avoid strenuous exercise that evening and resume full activity the next day.  A sitz bath (soaking in a warm tub) or bidet is soothing, and can be useful for cleansing the area after bowel movements.     3. To avoid constipation, take two tablespoons of natural wheat bran, natural oat bran, flax, Benefiber or any over the counter fiber supplement and increase your water intake to 7-8 glasses daily.    4. Unless you have been prescribed anorectal medication, do not put anything inside your rectum for two weeks: No suppositories, enemas, fingers, etc.  5. Occasionally, you may have more bleeding than usual after the banding procedure.  This is often from the untreated hemorrhoids rather than the treated one.  Don't be concerned if there is a tablespoon or so of blood.  If there is more blood than this, lie flat with your bottom higher than your head and apply an ice pack to the area. If the bleeding  does not stop within a half an hour or if you feel faint, call our office at [phone] or go to the emergency room.  6. Problems are not common; however, if there is a substantial amount of bleeding, severe pain, chills, fever or difficulty passing urine (very rare) or other problems, you should call us at [phone] or report to the nearest emergency room.  7. Do not stay seated continuously for more than 2-3 hours for a day or two after the procedure.  Tighten your buttock muscles 10-15 times every two hours and take 10-15 deep breaths every 1-2 hours.  Do not spend more than a few minutes on the toilet if you cannot empty your bowel; instead re-visit the toilet at a later time.

## 2020-09-16 NOTE — Progress Notes (Signed)
   Subjective:    Patient ID: Terry [name], male    DOB: 11/05/1956, 64 y.o.   MRN: [id number]  HPI Terry [name] is a 64 year old with a history of adenomatous colon polyps, colonic diverticulosis, internal hemorrhoids, sleep apnea, low testosterone, BPH who is here to discuss symptomatic hemorrhoids.  He is here alone today.  He is known to me from screening colonoscopy performed on 06/30/2018.  This revealed 2 polyps ranging in size from 5 to 9 mm removed from the transverse colon and cecum.  Polyps were found today adenomatous in the cecum and sessile serrated polyp without dysplasia in the transverse colon.  There was diverticulosis in the left colon and internal hemorrhoids.  He reports that he has been having intermittent issues with internal hemorrhoids but also fecal smearing.  He has perianal itching, prolapse symptoms and uncomfortable perianal sensation after defecation.  This comes and goes and is intermittent.  Bowel movements are regular.  No constipation or diarrhea.  No rectal bleeding but very rare scant red blood with wiping.  He has used Preparation H cream but no prescription cream.  No prior hemorrhoidal intervention.  No abdominal pain upper GI or hepatobiliary complaint.   Review of Systems As per HPI, otherwise negative  Current Medications, Allergies, Past Medical History, Past Surgical History, Family History and Social History were reviewed in [employer] record.     Objective:   Physical Exam BP 108/76   Pulse 92   Ht 5\' 9"  (1.753 m)   Wt 169 lb (76.7 kg)   BMI 24.96 kg/m  Gen: awake, alert, NAD HEENT: anicteric Abd: soft, NT/ND, +BS throughout Rectal: Normal perianal exam, nontender, no masses, light brown stool. Ext: no c/c/e Neuro: nonfocal      Assessment & Plan:  64 year old with a history of adenomatous colon polyps, colonic diverticulosis, internal hemorrhoids, sleep apnea, low testosterone, BPH who is here to discuss  symptomatic hemorrhoids.  1.  Symptomatic internal hemorrhoids --prolapse and perianal irritation.  We discussed medical therapy which is often helpful but ultimately not definitive for improving chronic symptoms.  We discussed hemorrhoidal banding and also surgical hemorrhoidectomy.  After discussing the risk, benefits and alternatives he wishes to pursue hemorrhoidal banding today.   PROCEDURE NOTE:  The patient presents with symptomatic grade 2 internal hemorrhoids, requesting rubber band ligation of his hemorrhoidal disease.  All risks, benefits and alternative forms of therapy were described and informed consent was obtained.   The anorectum was pre-medicated with 0.125% nitroglycerin ointment The decision was made to band the LL internal hemorrhoid, and the [place] was used to perform band ligation without complication.   Digital anorectal examination was then performed to assure proper positioning of the band, and to adjust the banded tissue as required.  The patient was discharged home without pain or other issues.  Dietary and behavioral recommendations were given and along with follow-up instructions.     The patient will return as scheduled for follow-up and possible additional banding as required. No complications were encountered and the patient tolerated the procedure well.   2.  Fecal smearing --very likely secondary to prolapsed internal hemorrhoids.  We will monitor response of the symptom and if persistent consider addition of fiber therapy/Metamucil.

## 2020-09-20 NOTE — Assessment & Plan Note (Signed)
Mild OSA is treated based on symptoms. So far he says he is doing fine without CPAP. Plan- d/c CPAP for now. Discussed symptoms to watch for. We can see him again in a year to reassess, if needed.

## 2020-09-20 NOTE — Assessment & Plan Note (Signed)
Managing OTC Plan- he will ask for help if needed

## 2020-11-11 LAB — HM DIABETES EYE EXAM

## 2020-11-16 ENCOUNTER — Encounter: Payer: Self-pay | Admitting: Internal Medicine

## 2020-11-16 ENCOUNTER — Ambulatory Visit (INDEPENDENT_AMBULATORY_CARE_PROVIDER_SITE_OTHER): Admitting: Internal Medicine

## 2020-11-16 VITALS — BP 124/66 | HR 56 | Ht 69.0 in | Wt 172.0 lb

## 2020-11-16 DIAGNOSIS — K648 Other hemorrhoids: Secondary | ICD-10-CM | POA: Diagnosis not present

## 2020-11-16 NOTE — Patient Instructions (Addendum)
Please purchase the following medications over the counter and take as directed: Metamucil-1 tablespoon dissolved in at least 8 ounces water/juice daily.  You have been scheduled for your 3rd hemorrhoidal banding on 12/21/20 at 11:00 am.  If you are age 65 or older, your body mass index should be between 23-30. Your Body mass index is 25.4 kg/m. If this is out of the aforementioned range listed, please consider follow up with your Primary Care Provider.  If you are age 52 or younger, your body mass index should be between 19-25. Your Body mass index is 25.4 kg/m. If this is out of the aformentioned range listed, please consider follow up with your Primary Care Provider.   Due to recent changes in healthcare laws, you may see the results of your imaging and laboratory studies on MyChart before your provider has had a chance to review them.  We understand that in some cases there may be results that are confusing or concerning to you. Not all laboratory results come back in the same time frame and the provider may be waiting for multiple results in order to interpret others.  Please give [place] 48 hours in order for your provider to thoroughly review all the results before contacting the office for clarification of your results.   HEMORRHOID BANDING PROCEDURE    FOLLOW-UP CARE   1. The procedure you have had should have been relatively painless since the banding of the area involved does not have nerve endings and there is no pain sensation.  The rubber band cuts off the blood supply to the hemorrhoid and the band may fall off as soon as 48 hours after the banding (the band may occasionally be seen in the toilet bowl following a bowel movement). You may notice a temporary feeling of fullness in the rectum which should respond adequately to plain Tylenol or Motrin.  2. Following the banding, avoid strenuous exercise that evening and resume full activity the next day.  A sitz bath (soaking in a warm  tub) or bidet is soothing, and can be useful for cleansing the area after bowel movements.     3. To avoid constipation, take two tablespoons of natural wheat bran, natural oat bran, flax, Benefiber or any over the counter fiber supplement and increase your water intake to 7-8 glasses daily.    4. Unless you have been prescribed anorectal medication, do not put anything inside your rectum for two weeks: No suppositories, enemas, fingers, etc.  5. Occasionally, you may have more bleeding than usual after the banding procedure.  This is often from the untreated hemorrhoids rather than the treated one.  Don't be concerned if there is a tablespoon or so of blood.  If there is more blood than this, lie flat with your bottom higher than your head and apply an ice pack to the area. If the bleeding does not stop within a half an hour or if you feel faint, call our office at [phone] or go to the emergency room.  6. Problems are not common; however, if there is a substantial amount of bleeding, severe pain, chills, fever or difficulty passing urine (very rare) or other problems, you should call us at [phone] or report to the nearest emergency room.  7. Do not stay seated continuously for more than 2-3 hours for a day or two after the procedure.  Tighten your buttock muscles 10-15 times every two hours and take 10-15 deep breaths every 1-2 hours.  Do not spend more than a few minutes on the toilet if you cannot empty your bowel; instead re-visit the toilet at a later time.

## 2020-11-17 NOTE — Progress Notes (Signed)
Terry [name] is a 65 year old male who returns for hemorrhoidal banding. Initial hemorrhoidal banding on 09/16/2020 for symptomatic prolapsing internal hemorrhoids with irritation/perianal discomfort  Tolerated the first banding well. Symptoms may be mildly improved to this point   PROCEDURE NOTE:  The patient presents with symptomatic grade 2 internal hemorrhoids, requesting rubber band ligation of his hemorrhoidal disease.  All risks, benefits and alternative forms of therapy were described and informed consent was obtained.   The anorectum was pre-medicated with 0.125% nitroglycerin ointment The decision was made to band the RP (left lateral vein and initially) internal hemorrhoid, and the [place] was used to perform band ligation without complication.   Digital anorectal examination was then performed to assure proper positioning of the band, and to adjust the banded tissue as required.  The patient was discharged home without pain or other issues.  Dietary and behavioral recommendations were given and along with follow-up instructions.     The following adjunctive treatments were recommended: Add Metamucil 1 heaping tablespoon daily  The patient will return as scheduled for follow-up and possible additional banding as required. No complications were encountered and the patient tolerated the procedure well.

## 2020-11-28 ENCOUNTER — Telehealth: Payer: Self-pay

## 2020-12-21 ENCOUNTER — Encounter: Payer: Self-pay | Admitting: Internal Medicine

## 2020-12-21 ENCOUNTER — Ambulatory Visit (INDEPENDENT_AMBULATORY_CARE_PROVIDER_SITE_OTHER): Admitting: Internal Medicine

## 2020-12-21 VITALS — BP 110/74 | HR 68 | Ht 68.0 in | Wt 174.0 lb

## 2020-12-21 DIAGNOSIS — K648 Other hemorrhoids: Secondary | ICD-10-CM

## 2020-12-21 NOTE — Patient Instructions (Signed)
Please follow up as needed.  HEMORRHOID BANDING PROCEDURE    FOLLOW-UP CARE   1. The procedure you have had should have been relatively painless since the banding of the area involved does not have nerve endings and there is no pain sensation.  The rubber band cuts off the blood supply to the hemorrhoid and the band may fall off as soon as 48 hours after the banding (the band may occasionally be seen in the toilet bowl following a bowel movement). You may notice a temporary feeling of fullness in the rectum which should respond adequately to plain Tylenol or Motrin.  2. Following the banding, avoid strenuous exercise that evening and resume full activity the next day.  A sitz bath (soaking in a warm tub) or bidet is soothing, and can be useful for cleansing the area after bowel movements.     3. To avoid constipation, take two tablespoons of natural wheat bran, natural oat bran, flax, Benefiber or any over the counter fiber supplement and increase your water intake to 7-8 glasses daily.    4. Unless you have been prescribed anorectal medication, do not put anything inside your rectum for two weeks: No suppositories, enemas, fingers, etc.  5. Occasionally, you may have more bleeding than usual after the banding procedure.  This is often from the untreated hemorrhoids rather than the treated one.  Don't be concerned if there is a tablespoon or so of blood.  If there is more blood than this, lie flat with your bottom higher than your head and apply an ice pack to the area. If the bleeding does not stop within a half an hour or if you feel faint, call our office at [phone] or go to the emergency room.  6. Problems are not common; however, if there is a substantial amount of bleeding, severe pain, chills, fever or difficulty passing urine (very rare) or other problems, you should call us at [phone] or report to the nearest emergency room.  7. Do not stay seated continuously for more  than 2-3 hours for a day or two after the procedure.  Tighten your buttock muscles 10-15 times every two hours and take 10-15 deep breaths every 1-2 hours.  Do not spend more than a few minutes on the toilet if you cannot empty your bowel; instead re-visit the toilet at a later time.     

## 2020-12-21 NOTE — Progress Notes (Signed)
Terry [name] is a 65 year old male who returns for hemorrhoidal banding Hemorrhoidal banding performed on 09/16/2020 and 11/16/2020 Symptoms prior to banding: Prolapse, perianal irritation and discomfort  He has tolerated the first 2 banding's well.  He has improvement in his hemorrhoidal symptoms. He has continued Metamucil and is having regular bowel movements without constipation or diarrhea   PROCEDURE NOTE: The patient presents with symptomatic grade 2 internal hemorrhoids, requesting rubber band ligation of his hemorrhoidal disease.  All risks, benefits and alternative forms of therapy were described and informed consent was obtained.   The anorectum was pre-medicated with 0.125% nitroglycerin ointment The decision was made to band the RA (LL and RP banded previously) internal hemorrhoid, and the [place] was used to perform band ligation without complication.   Digital anorectal examination was then performed to assure proper positioning of the band, and to adjust the banded tissue as required.  The patient was discharged home without pain or other issues.  Dietary and behavioral recommendations were given and along with follow-up instructions.     The following adjunctive treatments were recommended: Continue Metamucil daily  The patient will return as needed for follow-up and possible additional banding as required. No complications were encountered and the patient tolerated the procedure well.

## 2020-12-30 ENCOUNTER — Other Ambulatory Visit: Payer: Self-pay | Admitting: Family Medicine

## 2020-12-30 DIAGNOSIS — E119 Type 2 diabetes mellitus without complications: Secondary | ICD-10-CM

## 2021-01-02 ENCOUNTER — Other Ambulatory Visit: Payer: Self-pay | Admitting: Family Medicine

## 2021-01-02 DIAGNOSIS — E1169 Type 2 diabetes mellitus with other specified complication: Secondary | ICD-10-CM

## 2021-01-02 DIAGNOSIS — E349 Endocrine disorder, unspecified: Secondary | ICD-10-CM

## 2021-01-09 ENCOUNTER — Ambulatory Visit: Admitting: Family Medicine

## 2021-01-16 ENCOUNTER — Other Ambulatory Visit: Payer: Self-pay | Admitting: Family Medicine

## 2021-01-16 DIAGNOSIS — E1159 Type 2 diabetes mellitus with other circulatory complications: Secondary | ICD-10-CM

## 2021-02-13 ENCOUNTER — Other Ambulatory Visit: Payer: Self-pay | Admitting: Family Medicine

## 2021-02-13 DIAGNOSIS — I152 Hypertension secondary to endocrine disorders: Secondary | ICD-10-CM

## 2021-03-01 ENCOUNTER — Other Ambulatory Visit: Payer: Self-pay | Admitting: Family Medicine

## 2021-03-01 ENCOUNTER — Ambulatory Visit: Admitting: Family Medicine

## 2021-03-01 DIAGNOSIS — E1159 Type 2 diabetes mellitus with other circulatory complications: Secondary | ICD-10-CM

## 2021-03-01 DIAGNOSIS — E1169 Type 2 diabetes mellitus with other specified complication: Secondary | ICD-10-CM

## 2021-03-01 DIAGNOSIS — I152 Hypertension secondary to endocrine disorders: Secondary | ICD-10-CM

## 2021-03-01 DIAGNOSIS — E785 Hyperlipidemia, unspecified: Secondary | ICD-10-CM

## 2021-03-15 ENCOUNTER — Encounter: Payer: Self-pay | Admitting: Family Medicine

## 2021-03-15 ENCOUNTER — Ambulatory Visit (INDEPENDENT_AMBULATORY_CARE_PROVIDER_SITE_OTHER): Admitting: Family Medicine

## 2021-03-15 ENCOUNTER — Other Ambulatory Visit: Payer: Self-pay

## 2021-03-15 VITALS — BP 116/79 | HR 62 | Temp 97.7°F | Ht 68.0 in | Wt 170.6 lb

## 2021-03-15 DIAGNOSIS — E349 Endocrine disorder, unspecified: Secondary | ICD-10-CM | POA: Diagnosis not present

## 2021-03-15 DIAGNOSIS — E1159 Type 2 diabetes mellitus with other circulatory complications: Secondary | ICD-10-CM

## 2021-03-15 DIAGNOSIS — E1169 Type 2 diabetes mellitus with other specified complication: Secondary | ICD-10-CM

## 2021-03-15 DIAGNOSIS — I152 Hypertension secondary to endocrine disorders: Secondary | ICD-10-CM

## 2021-03-15 DIAGNOSIS — E785 Hyperlipidemia, unspecified: Secondary | ICD-10-CM

## 2021-03-15 DIAGNOSIS — J301 Allergic rhinitis due to pollen: Secondary | ICD-10-CM | POA: Diagnosis not present

## 2021-03-15 LAB — BAYER DCA HB A1C WAIVED: HB A1C (BAYER DCA - WAIVED): 7.3 % — ABNORMAL HIGH (ref <7.0)

## 2021-03-15 MED ORDER — FLUTICASONE PROPIONATE 50 MCG/ACT NA SUSP: 2.0000 | Freq: Every day | NASAL | 3 refills | Status: DC

## 2021-03-15 MED ORDER — METFORMIN HCL 500 MG PO TABS: 1.0000 | ORAL_TABLET | Freq: Every day | ORAL | 3 refills | Status: DC

## 2021-03-15 MED ORDER — ATORVASTATIN CALCIUM 20 MG PO TABS: 1.0000 | ORAL_TABLET | Freq: Every day | ORAL | 3 refills | Status: DC

## 2021-03-15 MED ORDER — AMLODIPINE BESYLATE 5 MG PO TABS: 1.0000 | ORAL_TABLET | Freq: Every day | ORAL | 3 refills | Status: DC

## 2021-03-15 MED ORDER — ICOSAPENT ETHYL 1 G PO CAPS: 2.0000 g | ORAL_CAPSULE | Freq: Two times a day (BID) | ORAL | 3 refills | Status: DC

## 2021-03-15 MED ORDER — OLMESARTAN MEDOXOMIL 40 MG PO TABS: 40.0000 mg | ORAL_TABLET | Freq: Every day | ORAL | 3 refills | Status: DC

## 2021-03-15 MED ORDER — HYDROCHLOROTHIAZIDE 12.5 MG PO CAPS: 12.5000 mg | ORAL_CAPSULE | Freq: Every day | ORAL | 3 refills | Status: DC

## 2021-03-15 NOTE — Patient Instructions (Signed)
Please come in a 8am for testosterone labs.  These have to be collected early morning or they are not accurate.   You had labs performed today.  You will be contacted with the results of the labs once they are available, usually in the next 3 business days for routine lab work.  If you have an active my chart account, they will be released to your MyChart.  If you prefer to have these labs released to you via telephone, please let us know.  If you had a pap smear or biopsy performed, expect to be contacted in about 7-10 days.

## 2021-03-15 NOTE — Progress Notes (Signed)
Subjective: CC: Dm PCP: Raliegh Ip, DO [id number] Terry [name] is a 65 y.o. male presenting to clinic today for:  1. Type 2 Diabetes with hypertension, hyperlipidemia:  He admits that he is not exercising as he normally does.  He got married in February and has been trying to get his home and his new wife's home ready for sale.  They are currently building a house in [place].  Last eye exam: needs Last foot exam: needs Last A1c:  Lab Results  Component Value Date   HGBA1C 6.6 07/12/2020   Nephropathy screen indicated?: UTD Last flu, zoster and/or pneumovax:  Immunization History  Administered Date(s) Administered  . Fluad Quad(high Dose 65+) 08/19/2020  . Influenza Split 10/13/2015  . Influenza Whole 08/01/2010  . Influenza,inj,Quad PF,6+ Mos 08/01/2016, 08/30/2017, 10/14/2018, 08/11/2019  . Moderna Sars-Covid-2 Vaccination 01/05/2020, 02/02/2020, 02/28/2021  . Pneumococcal Polysaccharide-23 08/20/2019  . Tdap 03/10/2008, 07/02/2018  . Zoster 01/11/2017    2. Testosterone deficiency Has not had testosterone level since September 2021.  He continues to utilize topical testosterone gel as directed.  He denies any gynecomastia, testicular shrinkage or abnormalities.   ROS: Per HPI  Allergies  Allergen Reactions  . Penicillins Nausea And Vomiting   Past Medical History:  Diagnosis Date  . Diabetes mellitus without complication (HCC)   . Elevated blood sugar   . Esophageal reflux   . Essential hypertension, benign   . Hyperplasia of prostate   . Internal hemorrhoids   . Other and unspecified hyperlipidemia   . Other testicular hypofunction   . Sleep apnea    CPAP   . Tubular adenoma of colon   . Unspecified glaucoma(365.9)   . Unspecified hemorrhoids without mention of complication     Current Outpatient Medications:  .  amLODipine (NORVASC) 5 MG tablet, TAKE 1 TABLET DAILY, Disp: 90 tablet, Rfl: 0 .  atorvastatin (LIPITOR) 20 MG tablet, TAKE 1 TABLET  DAILY AS DIRECTED, Disp: 90 tablet, Rfl: 0 .  bimatoprost (LUMIGAN) 0.01 % SOLN, 1 drop at bedtime., Disp: , Rfl:  .  Cholecalciferol (VITAMIN D3) 5000 units CAPS, Take 1 capsule by mouth 3 (three) times a week., Disp: , Rfl:  .  Cinnamon 500 MG TABS, Take 1 tablet by mouth 2 (two) times daily., Disp: , Rfl:  .  glucose blood (FREESTYLE LITE) test strip, TEST BLOOD SUGAR TWICE A DAY R73.9, Disp: 200 strip, Rfl: 3 .  hydrochlorothiazide (MICROZIDE) 12.5 MG capsule, TAKE 1 CAPSULE DAILY, Disp: 90 capsule, Rfl: 0 .  metFORMIN (GLUCOPHAGE) 500 MG tablet, TAKE 1 TABLET DAILY WITH BREAKFAST, Disp: 90 tablet, Rfl: 0 .  olmesartan (BENICAR) 40 MG tablet, Take 1 tablet (40 mg total) by mouth daily., Disp: 90 tablet, Rfl: 3 .  OVER THE COUNTER MEDICATION, Allergy relief otc, nasal spray, fluticasone spray, Disp: , Rfl:  .  testosterone (ANDROGEL) 50 MG/5GM (1%) GEL, PLACE 5 GRAMS  (1 PACKET) ONTO THE SKIN DAILY, Disp: 450 g, Rfl: 1 .  timolol (BETIMOL) 0.25 % ophthalmic solution, Place 1 drop into both eyes 2 (two) times daily. , Disp: , Rfl:  .  VASCEPA 1 g capsule, TAKE 2 CAPSULES TWICE A DAY, Disp: 360 capsule, Rfl: 0 Social History   Socioeconomic History  . Marital status: Widowed    Spouse name: Not on file  . Number of children: Not on file  . Years of education: Not on file  . Highest education level: Not on file  Occupational History  . Not on  file  Tobacco Use  . Smoking status: Never Smoker  . Smokeless tobacco: Never Used  Vaping Use  . Vaping Use: Never used  Substance and Sexual Activity  . Alcohol use: Not Currently    Comment: VERY RARE  . Drug use: No  . Sexual activity: Not Currently  Other Topics Concern  . Not on file  Social History Narrative  . Not on file   Social Determinants of Health   Financial Resource Strain: Not on file  Food Insecurity: Not on file  Transportation Needs: Not on file  Physical Activity: Not on file  Stress: Not on file  Social  Connections: Not on file  Intimate Partner Violence: Not on file   Family History  Problem Relation Age of Onset  . Heart disease Mother   . Stroke Father   . Colon cancer Paternal Uncle        65's  . Esophageal cancer Neg Hx   . Rectal cancer Neg Hx   . Stomach cancer Neg Hx     Objective: Office vital signs reviewed. BP 116/79   Pulse 62   Temp 97.7 F (36.5 C)   Ht $R'5\' 8"'dr$  (1.727 m)   Wt 170 lb 9.6 oz (77.4 kg)   SpO2 96%   BMI 25.94 kg/m   Physical Examination:  General: Awake, alert, well nourished, No acute distress HEENT: Normal, sclera white, MMM, no carotid bruits Cardio: regular rate and rhythm, S1S2 heard, no murmurs appreciated Pulm: clear to auscultation bilaterally, no wheezes, rhonchi or rales; normal work of breathing on room air Extremities: warm, well perfused, No edema, cyanosis or clubbing; +2 pulses bilaterally MSK: normal gait and station  Diabetic Foot Exam - Simple   Simple Foot Form Diabetic Foot exam was performed with the following findings: Yes 03/15/2021 12:47 PM  Visual Inspection No deformities, no ulcerations, no other skin breakdown bilaterally: Yes Sensation Testing Intact to touch and monofilament testing bilaterally: Yes Pulse Check Posterior Tibialis and Dorsalis pulse intact bilaterally: Yes Comments      Assessment/ Plan: 65 y.o. male   Type 2 diabetes mellitus with other specified complication, without long-term current use of insulin ([place]) - Plan: Bayer DCA Hb A1c Waived, Lipid panel  Hyperlipidemia associated with type 2 diabetes mellitus ([place]) - Plan: Lipid panel, CMP14+EGFR  Hypertension associated with diabetes ([place])  Testosterone deficiency - Plan: Testosterone, CBC, PSA  Seasonal allergic rhinitis due to pollen - Plan: fluticasone (FLONASE) 50 MCG/ACT nasal spray  Mr. Terry [name] is in great spirits today.  His sugar has unfortunately increased a little since her last visit but they are clear reasons for this as he  is not as physically active nor is he maintaining his typical diet given his most recent marriage and being in the process of living out of his case due to selling homes.  I would like him to try and increase his physical exercise and get back to his normal eating habits.  We will give him 3 months for this.  We discussed that if A1c does not get back to normal range at 7 or below that we will advance his metformin to twice daily.  He was amenable to this plan and will follow up as directed  Continue statin  Blood pressure at goal.  Continue current regimen  He will come in for testosterone, CBC and PSA sometime later this week.  He knows that this needs to be done early in the morning  Flonase renewed  Orders  Placed This Encounter  Procedures  . Bayer DCA Hb A1c Waived  . Lipid panel  . CMP14+EGFR  . Testosterone    Standing Status:   Future    Standing Expiration Date:   03/15/2022  . CBC    Standing Status:   Future    Standing Expiration Date:   03/15/2022  . PSA    Standing Status:   Future    Standing Expiration Date:   03/15/2022   Meds ordered this encounter  Medications  . fluticasone (FLONASE) 50 MCG/ACT nasal spray    Sig: Place 2 sprays into both nostrils daily.    Dispense:  48 g    Refill:  3  . amLODipine (NORVASC) 5 MG tablet    Sig: Take 1 tablet (5 mg total) by mouth daily.    Dispense:  90 tablet    Refill:  3  . atorvastatin (LIPITOR) 20 MG tablet    Sig: Take 1 tablet (20 mg total) by mouth daily. as directed    Dispense:  90 tablet    Refill:  3  . hydrochlorothiazide (MICROZIDE) 12.5 MG capsule    Sig: Take 1 capsule (12.5 mg total) by mouth daily.    Dispense:  90 capsule    Refill:  3  . metFORMIN (GLUCOPHAGE) 500 MG tablet    Sig: Take 1 tablet (500 mg total) by mouth daily with breakfast.    Dispense:  90 tablet    Refill:  3  . olmesartan (BENICAR) 40 MG tablet    Sig: Take 1 tablet (40 mg total) by mouth daily.    Dispense:  90 tablet     Refill:  3  . icosapent Ethyl (VASCEPA) 1 g capsule    Sig: Take 2 capsules (2 g total) by mouth 2 (two) times daily.    Dispense:  360 capsule    Refill:  [place], [place] [phone]

## 2021-03-16 LAB — CMP14+EGFR
ALT: 42 IU/L (ref 0–44)
AST: 29 IU/L (ref 0–40)
Albumin/Globulin Ratio: 2.7 — ABNORMAL HIGH (ref 1.2–2.2)
Albumin: 5.6 g/dL — ABNORMAL HIGH (ref 3.8–4.8)
Alkaline Phosphatase: 60 IU/L (ref 44–121)
BUN/Creatinine Ratio: 24 (ref 10–24)
BUN: 21 mg/dL (ref 8–27)
Bilirubin Total: 0.5 mg/dL (ref 0.0–1.2)
CO2: 21 mmol/L (ref 20–29)
Calcium: 10.3 mg/dL — ABNORMAL HIGH (ref 8.6–10.2)
Chloride: 97 mmol/L (ref 96–106)
Creatinine, Ser: 0.89 mg/dL (ref 0.76–1.27)
Globulin, Total: 2.1 g/dL (ref 1.5–4.5)
Glucose: 156 mg/dL — ABNORMAL HIGH (ref 65–99)
Potassium: 4.9 mmol/L (ref 3.5–5.2)
Sodium: 138 mmol/L (ref 134–144)
Total Protein: 7.7 g/dL (ref 6.0–8.5)
eGFR: 96 mL/min/{1.73_m2} (ref >59)

## 2021-03-16 LAB — LIPID PANEL
Chol/HDL Ratio: 3.1 ratio (ref 0.0–5.0)
Cholesterol, Total: 175 mg/dL (ref 100–199)
HDL: 56 mg/dL (ref >39)
LDL Chol Calc (NIH): 86 mg/dL (ref 0–99)
Triglycerides: 195 mg/dL — ABNORMAL HIGH (ref 0–149)
VLDL Cholesterol Cal: 33 mg/dL (ref 5–40)

## 2021-03-16 LAB — SPECIMEN STATUS REPORT

## 2021-03-27 ENCOUNTER — Other Ambulatory Visit

## 2021-03-27 ENCOUNTER — Other Ambulatory Visit: Payer: Self-pay

## 2021-03-27 DIAGNOSIS — E349 Endocrine disorder, unspecified: Secondary | ICD-10-CM

## 2021-03-28 LAB — TESTOSTERONE: Testosterone: 693 ng/dL (ref 264–916)

## 2021-03-28 LAB — CBC
Hematocrit: 45.3 % (ref 37.5–51.0)
Hemoglobin: 15 g/dL (ref 13.0–17.7)
MCH: 29.7 pg (ref 26.6–33.0)
MCHC: 33.1 g/dL (ref 31.5–35.7)
MCV: 90 fL (ref 79–97)
Platelets: 231 10*3/uL (ref 150–450)
RBC: 5.05 x10E6/uL (ref 4.14–5.80)
RDW: 12.9 % (ref 11.6–15.4)
WBC: 6.7 10*3/uL (ref 3.4–10.8)

## 2021-03-28 LAB — PSA: Prostate Specific Ag, Serum: 3.6 ng/mL (ref 0.0–4.0)

## 2021-07-20 ENCOUNTER — Ambulatory Visit (INDEPENDENT_AMBULATORY_CARE_PROVIDER_SITE_OTHER): Payer: Medicare Other | Admitting: Family Medicine

## 2021-07-20 ENCOUNTER — Other Ambulatory Visit: Payer: Self-pay

## 2021-07-20 ENCOUNTER — Encounter: Payer: Self-pay | Admitting: Family Medicine

## 2021-07-20 VITALS — BP 115/80 | HR 80 | Temp 97.6°F | Ht 68.0 in | Wt 172.6 lb

## 2021-07-20 DIAGNOSIS — E663 Overweight: Secondary | ICD-10-CM | POA: Diagnosis not present

## 2021-07-20 DIAGNOSIS — Z23 Encounter for immunization: Secondary | ICD-10-CM

## 2021-07-20 DIAGNOSIS — E1169 Type 2 diabetes mellitus with other specified complication: Secondary | ICD-10-CM | POA: Diagnosis not present

## 2021-07-20 DIAGNOSIS — J310 Chronic rhinitis: Secondary | ICD-10-CM

## 2021-07-20 DIAGNOSIS — E1159 Type 2 diabetes mellitus with other circulatory complications: Secondary | ICD-10-CM

## 2021-07-20 DIAGNOSIS — Z0000 Encounter for general adult medical examination without abnormal findings: Secondary | ICD-10-CM

## 2021-07-20 DIAGNOSIS — E785 Hyperlipidemia, unspecified: Secondary | ICD-10-CM

## 2021-07-20 DIAGNOSIS — I152 Hypertension secondary to endocrine disorders: Secondary | ICD-10-CM

## 2021-07-20 LAB — BAYER DCA HB A1C WAIVED: HB A1C (BAYER DCA - WAIVED): 6.6 % — ABNORMAL HIGH (ref 4.8–5.6)

## 2021-07-20 MED ORDER — EMPAGLIFLOZIN 10 MG PO TABS: 10.0000 mg | ORAL_TABLET | Freq: Every day | ORAL | 0 refills | Status: DC

## 2021-07-20 NOTE — Patient Instructions (Addendum)
Trial of Jardiance '10mg'$  to REPLACE the Metformin. Take 1 tablet daily.   Let me know if you are tolerating without problems and if it is positively impacting your weight in 2 weeks so we can get it sent to your mail order before you run out of sample. Sugar looks great A1c 6.6.   IF you develop: Pain with urination, Genital discoloration/ pain, Pain/ discoloration in feet STOP MEDICATION IMMEDIATELY and seek care.

## 2021-07-20 NOTE — Progress Notes (Signed)
Terry [name] is a 65 y.o. male presents to office today for annual physical exam examination.    Concerns today include: 1. Type 2 Diabetes with hypertension, hyperlipidemia:  Compliant with his metformin 500 mg daily.  He has been trying to work on weight loss but still has about 7 or 8 pounds to lose.  He is currently building a home with his new wife.  Life is pretty good.  Vision is stable.  He is compliant with Benicar, Vascepa, Lipitor and Norvasc  Last eye exam: Up-to-date Last foot exam: Up-to-date Last A1c:  Lab Results  Component Value Date   HGBA1C 7.3 (H) 03/15/2021   Nephropathy screen indicated?:  On ARB Last flu, zoster and/or pneumovax:  Immunization History  Administered Date(s) Administered   Fluad Quad(high Dose 65+) 08/19/2020   Influenza Split 10/13/2015   Influenza Whole 08/01/2010   Influenza,inj,Quad PF,6+ Mos 08/01/2016, 08/30/2017, 10/14/2018, 08/11/2019   Moderna Sars-Covid-2 Vaccination 01/05/2020, 02/02/2020, 02/28/2021   Pneumococcal Polysaccharide-23 08/20/2019   Tdap 03/10/2008, 07/02/2018   Zoster, Live 01/11/2017    ROS: No chest pain, shortness of breath  2.  Rhinitis Patient reports chronic rhinitis.  He has stuffy nose every night at bedtime and uses Flonase but still has issues with this.  His wife apparently had some type of sinus surgery with Dr. Wilburn Cornelia and he wants to know if he can get an evaluation with him.  He denies symptoms being severe but thought the constipation might be a benefit   Marital status: married, Substance use: none Diet: balanced, Exercise: stays active Last eye exam: UTD Last dental exam: UTD Last colonoscopy: UTD Refills needed today: none Immunizations needed: Immunization History  Administered Date(s) Administered   Fluad Quad(high Dose 65+) 08/19/2020   Influenza Split 10/13/2015   Influenza Whole 08/01/2010   Influenza,inj,Quad PF,6+ Mos 08/01/2016, 08/30/2017, 10/14/2018, 08/11/2019   Moderna  Sars-Covid-2 Vaccination 01/05/2020, 02/02/2020, 02/28/2021   Pneumococcal Polysaccharide-23 08/20/2019   Tdap 03/10/2008, 07/02/2018   Zoster, Live 01/11/2017     Past Medical History:  Diagnosis Date   Diabetes mellitus without complication (HCC)    Elevated blood sugar    Esophageal reflux    Essential hypertension, benign    Hyperplasia of prostate    Internal hemorrhoids    Other and unspecified hyperlipidemia    Other testicular hypofunction    Sleep apnea    CPAP    Tubular adenoma of colon    Unspecified glaucoma(365.9)    Unspecified hemorrhoids without mention of complication    Social History   Socioeconomic History   Marital status: Widowed    Spouse name: Not on file   Number of children: Not on file   Years of education: Not on file   Highest education level: Not on file  Occupational History   Not on file  Tobacco Use   Smoking status: Never   Smokeless tobacco: Never  Vaping Use   Vaping Use: Never used  Substance and Sexual Activity   Alcohol use: Not Currently    Comment: VERY RARE   Drug use: No   Sexual activity: Not Currently  Other Topics Concern   Not on file  Social History Narrative   Not on file   Social Determinants of Health   Financial Resource Strain: Not on file  Food Insecurity: Not on file  Transportation Needs: Not on file  Physical Activity: Not on file  Stress: Not on file  Social Connections: Not on file  Intimate Partner Violence:  Not on file   Past Surgical History:  Procedure Laterality Date   COLONOSCOPY     LIPOMA EXCISION Right 2000   shoulder   Family History  Problem Relation Age of Onset   Heart disease Mother    Stroke Father    Colon cancer Paternal Uncle        77's   Esophageal cancer Neg Hx    Rectal cancer Neg Hx    Stomach cancer Neg Hx     Current Outpatient Medications:    amLODipine (NORVASC) 5 MG tablet, Take 1 tablet (5 mg total) by mouth daily., Disp: 90 tablet, Rfl: 3    atorvastatin (LIPITOR) 20 MG tablet, Take 1 tablet (20 mg total) by mouth daily. as directed, Disp: 90 tablet, Rfl: 3   bimatoprost (LUMIGAN) 0.01 % SOLN, 1 drop at bedtime., Disp: , Rfl:    Cholecalciferol (VITAMIN D3) 5000 units CAPS, Take 1 capsule by mouth 3 (three) times a week., Disp: , Rfl:    Cinnamon 500 MG TABS, Take 1 tablet by mouth 2 (two) times daily., Disp: , Rfl:    fluticasone (FLONASE) 50 MCG/ACT nasal spray, Place 2 sprays into both nostrils daily., Disp: 48 g, Rfl: 3   glucose blood (FREESTYLE LITE) test strip, TEST BLOOD SUGAR TWICE A DAY R73.9, Disp: 200 strip, Rfl: 3   hydrochlorothiazide (MICROZIDE) 12.5 MG capsule, Take 1 capsule (12.5 mg total) by mouth daily., Disp: 90 capsule, Rfl: 3   icosapent Ethyl (VASCEPA) 1 g capsule, Take 2 capsules (2 g total) by mouth 2 (two) times daily., Disp: 360 capsule, Rfl: 3   metFORMIN (GLUCOPHAGE) 500 MG tablet, Take 1 tablet (500 mg total) by mouth daily with breakfast., Disp: 90 tablet, Rfl: 3   olmesartan (BENICAR) 40 MG tablet, Take 1 tablet (40 mg total) by mouth daily., Disp: 90 tablet, Rfl: 3   OVER THE COUNTER MEDICATION, Allergy relief otc, nasal spray, fluticasone spray, Disp: , Rfl:    testosterone (ANDROGEL) 50 MG/5GM (1%) GEL, PLACE 5 GRAMS  (1 PACKET) ONTO THE SKIN DAILY, Disp: 450 g, Rfl: 1   timolol (BETIMOL) 0.25 % ophthalmic solution, Place 1 drop into both eyes 2 (two) times daily. , Disp: , Rfl:   Allergies  Allergen Reactions   Penicillins Nausea And Vomiting     ROS: Review of Systems Pertinent items noted in HPI and remainder of comprehensive ROS otherwise negative.    Physical exam BP 115/80   Pulse 80   Temp 97.6 F (36.4 C) (Temporal)   Ht '5\' 8"'$  (1.727 m)   Wt 172 lb 9.6 oz (78.3 kg)   SpO2 (!) 73%   BMI 26.24 kg/m  General appearance: alert, cooperative, appears stated age, and no distress Head: Normocephalic, without obvious abnormality, atraumatic Eyes: negative findings: lids and lashes  normal, conjunctivae and sclerae normal, corneas clear, and pupils equal, round, reactive to light and accomodation Ears: normal TM's and external ear canals both ears Nose: Nares normal. Septum midline. Mucosa normal. No drainage or sinus tenderness. Throat: lips, mucosa, and tongue normal; teeth and gums normal Neck: no adenopathy, supple, symmetrical, trachea midline, and thyroid not enlarged, symmetric, no tenderness/mass/nodules Back: symmetric, no curvature. ROM normal. No CVA tenderness. Lungs: clear to auscultation bilaterally Chest wall: no tenderness Heart: regular rate and rhythm, S1, S2 normal, no murmur, click, rub or gallop Abdomen: soft, non-tender; bowel sounds normal; no masses,  no organomegaly Extremities: extremities normal, atraumatic, no cyanosis or edema Pulses: 2+ and symmetric Skin:  Skin color, texture, turgor normal. No rashes or lesions Lymph nodes: Cervical, supraclavicular, and axillary nodes normal. Neurologic: Alert and oriented X 3, normal strength and tone. Normal symmetric reflexes. Normal coordination and gait   Assessment/ Plan: [name] here for annual physical exam.   Annual physical exam  Overweight (BMI 25.0-29.9)  Type 2 diabetes mellitus with other specified complication, without long-term current use of insulin (HCC) - Plan: Bayer DCA Hb A1c Waived, empagliflozin (JARDIANCE) 10 MG TABS tablet  Hyperlipidemia associated with type 2 diabetes mellitus ([place]) - Plan: Lipid Panel  Hypertension associated with diabetes ([place])  Need for immunization against influenza - Plan: Flu Vaccine QUAD High Dose(Fluad)  Chronic rhinitis - Plan: Ambulatory referral to ENT  Encouraged shingles vaccination, pneumococcal vaccination.  Fasting lipid panel ordered.  A1c shows control of diabetes at 6.6 weeks having difficulty with weight loss we will replace metformin with Jardiance.  Discussed potential side effects of medications of red flag signs/  symptoms warranting further evaluation.  I have given him 3 weeks worth of samples of the 10 mg and he will contact me in 2 weeks to let me know if he is tolerating.  At that point we will plan to refill and sent to mail order  Blood pressure controlled.  No changes needed.  May need to consider backing down on blood pressure medications pending response to Jardiance  Influenza vaccination administered  Referral to ENT placed for chronic rhinitis.  Counseled on healthy lifestyle choices, including diet (rich in fruits, vegetables and lean meats and low in salt and simple carbohydrates) and exercise (at least 30 minutes of moderate physical activity daily).  Patient to follow up in 1 year for annual exam or sooner if needed.  Kashay Cavenaugh M. Lajuana Ripple, DO

## 2021-07-21 LAB — LIPID PANEL
Chol/HDL Ratio: 3.5 ratio (ref 0.0–5.0)
Cholesterol, Total: 172 mg/dL (ref 100–199)
HDL: 49 mg/dL (ref >39)
LDL Chol Calc (NIH): 95 mg/dL (ref 0–99)
Triglycerides: 159 mg/dL — ABNORMAL HIGH (ref 0–149)
VLDL Cholesterol Cal: 28 mg/dL (ref 5–40)

## 2021-07-31 NOTE — Addendum Note (Signed)
Addended by: Janora Norlander on: 07/31/2021 01:32 PM   Modules accepted: Level of Service

## 2021-08-03 ENCOUNTER — Telehealth: Payer: Self-pay | Admitting: Family Medicine

## 2021-08-03 NOTE — Telephone Encounter (Signed)
Pt wants to let Lajuana Ripple know that he was able to tolerate empagliflozin (JARDIANCE) 10 MG TABS tablet and wants a rx sent to mail order for 3 mo supply Cancel metformin rx with [facility]. Pt aware that Lajuana Ripple is not here today and this message may be addressed tomorrow.

## 2021-08-04 ENCOUNTER — Other Ambulatory Visit: Payer: Self-pay | Admitting: Family Medicine

## 2021-08-04 DIAGNOSIS — E1169 Type 2 diabetes mellitus with other specified complication: Secondary | ICD-10-CM

## 2021-08-04 MED ORDER — EMPAGLIFLOZIN 10 MG PO TABS: 10.0000 mg | ORAL_TABLET | Freq: Every day | ORAL | 3 refills | Status: DC

## 2021-08-04 NOTE — Telephone Encounter (Signed)
Patient aware.

## 2021-08-04 NOTE — Telephone Encounter (Signed)
Rx sent.  I put in the notes to cancel metformin prescription but he may need to call and also cancel.

## 2021-08-04 NOTE — Telephone Encounter (Signed)
Pt returning call. Please call back.

## 2021-09-20 ENCOUNTER — Telehealth: Payer: Self-pay | Admitting: Family Medicine

## 2021-09-20 NOTE — Telephone Encounter (Signed)
Pt called to get status of his referral to see ENT. Pt says that he was referred a few months ago and has not heard back from anyone about it.  Please advise and call patient.

## 2021-09-21 ENCOUNTER — Telehealth: Payer: Self-pay | Admitting: Family Medicine

## 2021-09-22 NOTE — Telephone Encounter (Signed)
Again, please make sure patient comes in for labs and OK to put him in for video visit to renew meds

## 2021-09-22 NOTE — Telephone Encounter (Signed)
Have him come in for CBC and am testosterone level and ok to put him on video visit on a call day

## 2021-09-25 NOTE — Telephone Encounter (Signed)
Lmtcb  Have patient come in for labs and schedule a video visit with Dr. Darnell Level

## 2021-09-27 NOTE — Telephone Encounter (Signed)
Pt has appt 12/12

## 2021-10-16 ENCOUNTER — Encounter: Payer: Self-pay | Admitting: Family Medicine

## 2021-10-16 ENCOUNTER — Ambulatory Visit (INDEPENDENT_AMBULATORY_CARE_PROVIDER_SITE_OTHER): Payer: Medicare Other | Admitting: Family Medicine

## 2021-10-16 VITALS — BP 131/82 | HR 61 | Temp 97.9°F | Ht 68.0 in | Wt 167.8 lb

## 2021-10-16 DIAGNOSIS — E349 Endocrine disorder, unspecified: Secondary | ICD-10-CM | POA: Diagnosis not present

## 2021-10-16 DIAGNOSIS — E1169 Type 2 diabetes mellitus with other specified complication: Secondary | ICD-10-CM | POA: Diagnosis not present

## 2021-10-16 DIAGNOSIS — Z79899 Other long term (current) drug therapy: Secondary | ICD-10-CM

## 2021-10-16 DIAGNOSIS — Z23 Encounter for immunization: Secondary | ICD-10-CM | POA: Diagnosis not present

## 2021-10-16 DIAGNOSIS — E1159 Type 2 diabetes mellitus with other circulatory complications: Secondary | ICD-10-CM

## 2021-10-16 DIAGNOSIS — E119 Type 2 diabetes mellitus without complications: Secondary | ICD-10-CM | POA: Insufficient documentation

## 2021-10-16 DIAGNOSIS — I152 Hypertension secondary to endocrine disorders: Secondary | ICD-10-CM

## 2021-10-16 DIAGNOSIS — E785 Hyperlipidemia, unspecified: Secondary | ICD-10-CM

## 2021-10-16 LAB — BAYER DCA HB A1C WAIVED: HB A1C (BAYER DCA - WAIVED): 6.8 % — ABNORMAL HIGH (ref 4.8–5.6)

## 2021-10-16 MED ORDER — TESTOSTERONE 50 MG/5GM (1%) TD GEL: TRANSDERMAL | 1 refills | Status: DC

## 2021-10-16 NOTE — Progress Notes (Signed)
 Subjective: CC:DM PCP: Gottschalk, Ashly M, DO HPI:Terry [name] is a 65 y.o. male presenting to clinic today for:  1. Type 2 Diabetes with hypertension, hyperlipidemia:  Compliant with Jardiance, Lipitor, Vascepa, Norvasc, Benicar and Microzide.  He has been under little more stress.  His home was physically completed and it has been put off until March.  His wife is also been suffering with some health issues.  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  Last eye exam: UTD Last foot exam: UTD Last A1c:  Lab Results  Component Value Date   HGBA1C 6.6 (H) 07/20/2021   Nephropathy screen indicated?: UTD Last flu, zoster and/or pneumovax: PNA needs Immunization History  Administered Date(s) Administered   Fluad Quad(high Dose 65+) 08/19/2020, 07/20/2021   Influenza Split 10/13/2015   Influenza Whole 08/01/2010   Influenza,inj,Quad PF,6+ Mos 08/01/2016, 08/30/2017, 10/14/2018, 08/11/2019   Moderna Covid-19 Vaccine Bivalent Booster 18yrs & up 08/08/2021   Moderna Sars-Covid-2 Vaccination 01/05/2020, 02/02/2020, 02/28/2021   Pneumococcal Polysaccharide-23 08/20/2019   Tdap 03/10/2008, 07/02/2018   Zoster, Live 01/11/2017    ROS: no dysuria, genital discoloration or swelling.  No extremity changes including ulcers, sensory changes or discoloration.  No pain in the extremities.  2. Testosterone deficiency Patient reports that he ran out of his AndroGel.  He does not really feel any different however.  No reports of change in energy or weight.  ROS: Per HPI  Allergies  Allergen Reactions   Penicillins Nausea And Vomiting   Past Medical History:  Diagnosis Date   Diabetes mellitus without complication (HCC)    Elevated blood sugar    Esophageal reflux    Essential hypertension, benign    Hyperplasia of prostate    Internal hemorrhoids    Other and unspecified hyperlipidemia     Other testicular hypofunction    Sleep apnea    CPAP    Tubular adenoma of colon    Unspecified glaucoma(365.9)    Unspecified hemorrhoids without mention of complication     Current Outpatient Medications:    amLODipine (NORVASC) 5 MG tablet, Take 1 tablet (5 mg total) by mouth daily., Disp: 90 tablet, Rfl: 3   atorvastatin (LIPITOR) 20 MG tablet, Take 1 tablet (20 mg total) by mouth daily. as directed, Disp: 90 tablet, Rfl: 3   bimatoprost (LUMIGAN) 0.01 % SOLN, 1 drop at bedtime., Disp: , Rfl:    Cholecalciferol (VITAMIN D3) 5000 units CAPS, Take 1 capsule by mouth 3 (three) times a week., Disp: , Rfl:    Cinnamon 500 MG TABS, Take 1 tablet by mouth 2 (two) times daily., Disp: , Rfl:    empagliflozin (JARDIANCE) 10 MG TABS tablet, Take 1 tablet (10 mg total) by mouth daily before breakfast., Disp: 90 tablet, Rfl: 3   fluticasone (FLONASE) 50 MCG/ACT nasal spray, Place 2 sprays into both nostrils daily., Disp: 48 g, Rfl: 3   glucose blood (FREESTYLE LITE) test strip, TEST BLOOD SUGAR TWICE A DAY R73.9, Disp: 200 strip, Rfl: 3   hydrochlorothiazide (MICROZIDE) 12.5 MG capsule, Take 1 capsule (12.5 mg total) by mouth daily., Disp: 90 capsule, Rfl: 3   icosapent Ethyl (VASCEPA) 1 g capsule, Take 2 capsules (2 g total) by mouth 2 (two) times daily., Disp: 360 capsule, Rfl: 3   olmesartan (BENICAR) 40 MG tablet, Take 1 tablet (40 mg total) by mouth daily., Disp: 90 tablet, Rfl: 3   OVER THE   COUNTER MEDICATION, Allergy relief otc, nasal spray, fluticasone spray, Disp: , Rfl:    testosterone (ANDROGEL) 50 MG/5GM (1%) GEL, PLACE 5 GRAMS  (1 PACKET) ONTO THE SKIN DAILY, Disp: 450 g, Rfl: 1   timolol (TIMOPTIC) 0.5 % ophthalmic solution, , Disp: , Rfl:  Social History   Socioeconomic History   Marital status: Widowed    Spouse name: Not on file   Number of children: Not on file   Years of education: Not on file   Highest education level: Not on file  Occupational History   Not on file   Tobacco Use   Smoking status: Never   Smokeless tobacco: Never  Vaping Use   Vaping Use: Never used  Substance and Sexual Activity   Alcohol use: Not Currently    Comment: VERY RARE   Drug use: No   Sexual activity: Not Currently  Other Topics Concern   Not on file  Social History Narrative   Not on file   Social Determinants of Health   Financial Resource Strain: Not on file  Food Insecurity: Not on file  Transportation Needs: Not on file  Physical Activity: Not on file  Stress: Not on file  Social Connections: Not on file  Intimate Partner Violence: Not on file   Family History  Problem Relation Age of Onset   Heart disease Mother    Stroke Father    Colon cancer Paternal Uncle        64's   Esophageal cancer Neg Hx    Rectal cancer Neg Hx    Stomach cancer Neg Hx     Objective: Office vital signs reviewed. BP 131/82   Pulse 61   Temp 97.9 F (36.6 C)   Ht 5' 8" (1.727 m)   Wt 167 lb 12.8 oz (76.1 kg)   SpO2 97%   BMI 25.51 kg/m   Physical Examination:  General: Awake, alert, well appearing, well nourished, No acute distress HEENT: Sclera white.  Moist mucous membranes. Cardio: regular rate and rhythm, S1S2 heard, no murmurs appreciated Pulm: clear to auscultation bilaterally, no wheezes, rhonchi or rales; normal work of breathing on room air MSK: Normal gait and station  Assessment/ Plan: 65 y.o. male   Type 2 diabetes mellitus with other specified complication, without long-term current use of insulin ([place]) - Plan: CMP14+EGFR, Bayer DCA Hb A1c Waived  Hyperlipidemia associated with type 2 diabetes mellitus ([place])  Hypertension associated with diabetes ([place])  Testosterone deficiency - Plan: CBC, Testosterone, ToxASSURE Select 13 (MW), Urine, testosterone (ANDROGEL) 50 MG/5GM (1%) GEL  Controlled substance agreement signed - Plan: ToxASSURE Select 13 (MW), Urine  Need for pneumococcal vaccination - Plan: Pneumococcal conjugate vaccine 20-valent  (Prevnar 20)  Sugar remains under excellent control.  No changes.  No red flag signs or symptoms with regards to Jardiance use.  Its affordable.  He will continue Vascepa and statin for cholesterol control  Blood pressure well controlled.  No changes  Controlled substance contract, and drug screening were obtained as per office policy.  Check CBC, testosterone level .  Testosterone renewed  Pneumococcal vaccination administered  Orders Placed This Encounter  Procedures   CMP14+EGFR   Bayer DCA Hb A1c Waived   CBC   Testosterone   No orders of the defined types were placed in this encounter.  Janora Norlander, DO [place] [phone]

## 2021-10-16 NOTE — Patient Instructions (Signed)
You had labs performed today.  You will be contacted with the results of the labs once they are available, usually in the next 3 business days for routine lab work.  If you have an active my chart account, they will be released to your MyChart.  If you prefer to have these labs released to you via telephone, please let us know.     

## 2021-10-17 LAB — CMP14+EGFR
ALT: 34 IU/L (ref 0–44)
AST: 26 IU/L (ref 0–40)
Albumin/Globulin Ratio: 2.1 (ref 1.2–2.2)
Albumin: 5.1 g/dL — ABNORMAL HIGH (ref 3.8–4.8)
Alkaline Phosphatase: 51 IU/L (ref 44–121)
BUN/Creatinine Ratio: 29 — ABNORMAL HIGH (ref 10–24)
BUN: 22 mg/dL (ref 8–27)
Bilirubin Total: 0.6 mg/dL (ref 0.0–1.2)
CO2: 24 mmol/L (ref 20–29)
Calcium: 10.2 mg/dL (ref 8.6–10.2)
Chloride: 101 mmol/L (ref 96–106)
Creatinine, Ser: 0.77 mg/dL (ref 0.76–1.27)
Globulin, Total: 2.4 g/dL (ref 1.5–4.5)
Glucose: 147 mg/dL — ABNORMAL HIGH (ref 70–99)
Potassium: 5.1 mmol/L (ref 3.5–5.2)
Sodium: 139 mmol/L (ref 134–144)
Total Protein: 7.5 g/dL (ref 6.0–8.5)
eGFR: 99 mL/min/{1.73_m2} (ref >59)

## 2021-10-17 LAB — CBC
Hematocrit: 43.6 % (ref 37.5–51.0)
Hemoglobin: 15 g/dL (ref 13.0–17.7)
MCH: 30.4 pg (ref 26.6–33.0)
MCHC: 34.4 g/dL (ref 31.5–35.7)
MCV: 88 fL (ref 79–97)
Platelets: 249 10*3/uL (ref 150–450)
RBC: 4.93 x10E6/uL (ref 4.14–5.80)
RDW: 12.8 % (ref 11.6–15.4)
WBC: 6.4 10*3/uL (ref 3.4–10.8)

## 2021-10-17 LAB — TESTOSTERONE: Testosterone: 156 ng/dL — ABNORMAL LOW (ref 264–916)

## 2021-10-21 LAB — TOXASSURE SELECT 13 (MW), URINE

## 2021-11-01 DIAGNOSIS — J343 Hypertrophy of nasal turbinates: Secondary | ICD-10-CM | POA: Insufficient documentation

## 2021-11-01 DIAGNOSIS — R0683 Snoring: Secondary | ICD-10-CM | POA: Insufficient documentation

## 2021-11-01 DIAGNOSIS — J310 Chronic rhinitis: Secondary | ICD-10-CM | POA: Insufficient documentation

## 2021-11-01 DIAGNOSIS — J342 Deviated nasal septum: Secondary | ICD-10-CM | POA: Insufficient documentation

## 2021-11-13 ENCOUNTER — Telehealth: Payer: Self-pay | Admitting: Family Medicine

## 2021-11-13 ENCOUNTER — Other Ambulatory Visit: Payer: Self-pay | Admitting: Family Medicine

## 2021-11-13 DIAGNOSIS — E349 Endocrine disorder, unspecified: Secondary | ICD-10-CM

## 2021-11-13 MED ORDER — TESTOSTERONE 50 MG/5GM (1%) TD GEL: TRANSDERMAL | 1 refills | Status: DC

## 2021-11-13 NOTE — Telephone Encounter (Signed)
This was ordered 10/16/21 x27m

## 2021-11-13 NOTE — Progress Notes (Signed)
The Narcotic Database has been reviewed.  There were no red flags.    

## 2021-11-13 NOTE — Telephone Encounter (Signed)
Pt aware meds have been sent to [facility] and [facility]

## 2021-11-17 LAB — HM DIABETES EYE EXAM

## 2022-02-16 ENCOUNTER — Other Ambulatory Visit: Payer: Self-pay | Admitting: Family Medicine

## 2022-02-16 DIAGNOSIS — E1169 Type 2 diabetes mellitus with other specified complication: Secondary | ICD-10-CM

## 2022-02-16 NOTE — Telephone Encounter (Signed)
Appt made for 03/07/2022 at 3:30 ?

## 2022-02-16 NOTE — Telephone Encounter (Signed)
Gottschalk. NTBS in May for 5 mos ckup. Refill SENT ?

## 2022-02-20 ENCOUNTER — Other Ambulatory Visit: Payer: Self-pay | Admitting: Family Medicine

## 2022-02-20 DIAGNOSIS — E1159 Type 2 diabetes mellitus with other circulatory complications: Secondary | ICD-10-CM

## 2022-03-07 ENCOUNTER — Ambulatory Visit: Payer: TRICARE For Life (TFL) | Admitting: Family Medicine

## 2022-03-19 ENCOUNTER — Other Ambulatory Visit: Payer: Self-pay | Admitting: Family Medicine

## 2022-03-19 DIAGNOSIS — E1159 Type 2 diabetes mellitus with other circulatory complications: Secondary | ICD-10-CM

## 2022-04-09 ENCOUNTER — Encounter: Payer: Self-pay | Admitting: Family Medicine

## 2022-04-09 ENCOUNTER — Ambulatory Visit: Payer: Medicare Other | Admitting: Family Medicine

## 2022-04-10 ENCOUNTER — Ambulatory Visit (INDEPENDENT_AMBULATORY_CARE_PROVIDER_SITE_OTHER): Payer: Medicare Other | Admitting: Family Medicine

## 2022-04-10 ENCOUNTER — Encounter: Payer: Self-pay | Admitting: Family Medicine

## 2022-04-10 VITALS — BP 139/82 | HR 56 | Temp 97.2°F | Ht 68.0 in | Wt 170.8 lb

## 2022-04-10 DIAGNOSIS — E1159 Type 2 diabetes mellitus with other circulatory complications: Secondary | ICD-10-CM

## 2022-04-10 DIAGNOSIS — E349 Endocrine disorder, unspecified: Secondary | ICD-10-CM

## 2022-04-10 DIAGNOSIS — R350 Frequency of micturition: Secondary | ICD-10-CM

## 2022-04-10 DIAGNOSIS — E1169 Type 2 diabetes mellitus with other specified complication: Secondary | ICD-10-CM

## 2022-04-10 DIAGNOSIS — E785 Hyperlipidemia, unspecified: Secondary | ICD-10-CM

## 2022-04-10 DIAGNOSIS — I152 Hypertension secondary to endocrine disorders: Secondary | ICD-10-CM

## 2022-04-10 LAB — URINALYSIS, ROUTINE W REFLEX MICROSCOPIC
Bilirubin, UA: NEGATIVE
Ketones, UA: NEGATIVE
Leukocytes,UA: NEGATIVE
Nitrite, UA: NEGATIVE
Protein,UA: NEGATIVE
RBC, UA: NEGATIVE
Specific Gravity, UA: 1.02 (ref 1.005–1.030)
Urobilinogen, Ur: 0.2 mg/dL (ref 0.2–1.0)
pH, UA: 6 (ref 5.0–7.5)

## 2022-04-10 LAB — MICROSCOPIC EXAMINATION
Epithelial Cells (non renal): NONE SEEN /hpf (ref 0–10)
RBC, Urine: NONE SEEN /hpf (ref 0–2)
Renal Epithel, UA: NONE SEEN /hpf

## 2022-04-10 MED ORDER — ATORVASTATIN CALCIUM 20 MG PO TABS: 20.0000 mg | ORAL_TABLET | Freq: Every day | ORAL | 3 refills | Status: DC

## 2022-04-10 MED ORDER — SULFAMETHOXAZOLE-TRIMETHOPRIM 800-160 MG PO TABS: 1.0000 | ORAL_TABLET | Freq: Two times a day (BID) | ORAL | 0 refills | Status: AC

## 2022-04-10 MED ORDER — ICOSAPENT ETHYL 1 G PO CAPS: 2.0000 g | ORAL_CAPSULE | Freq: Two times a day (BID) | ORAL | 3 refills | Status: DC

## 2022-04-10 MED ORDER — HYDROCHLOROTHIAZIDE 12.5 MG PO CAPS: 12.5000 mg | ORAL_CAPSULE | Freq: Every day | ORAL | 3 refills | Status: DC

## 2022-04-10 MED ORDER — FREESTYLE LITE TEST VI STRP: ORAL_STRIP | 3 refills | Status: DC

## 2022-04-10 MED ORDER — OLMESARTAN MEDOXOMIL 40 MG PO TABS: 40.0000 mg | ORAL_TABLET | Freq: Every day | ORAL | 3 refills | Status: DC

## 2022-04-10 MED ORDER — EMPAGLIFLOZIN 10 MG PO TABS: 10.0000 mg | ORAL_TABLET | Freq: Every day | ORAL | 3 refills | Status: DC

## 2022-04-10 MED ORDER — AMLODIPINE BESYLATE 5 MG PO TABS: 5.0000 mg | ORAL_TABLET | Freq: Every day | ORAL | 3 refills | Status: DC

## 2022-04-10 MED ORDER — TESTOSTERONE 50 MG/5GM (1%) TD GEL: TRANSDERMAL | 1 refills | Status: DC

## 2022-04-10 NOTE — Patient Instructions (Signed)
Come in for fasting labs before 10am so we can check the testosterone level Your Urinalysis shows sugar and a few bacteria.  This has been sent for culture. In the meantime, i'm going to put you on a few day course of antibiotics.

## 2022-04-10 NOTE — Progress Notes (Signed)
Subjective: CC: Chronic follow-up PCP: Janora Norlander, DO ALP:FXTKWIO Terry [name] is a 66 y.o. male presenting to clinic today for:  1.  Dysuria Patient reports that over the last month and change he has been having some discomfort intermittently just at the end of urination.  He does not report any history of renal stones.  When he was in his 72s he did have a history of recurrent UTIs but has not had that problem in quite some time now.  He reports no fevers, nausea, vomiting, abdominal pain, penile discharge or swelling.  No discoloration or lesions noted on the penis.  He is treated with Jardiance and does watch for any signs and symptoms that may be concerning with use of that but this has been the best medication for his sugar so far  2.  Hypogonadism Patient is treated with testosterone.  He will come in later this week to have testosterone levels drawn along with fasting labs for type 2 diabetes  3.  Type 2 diabetes associated with hypertension hyperlipidemia Compliant with all medications.  Will come in for fasting labs later this week.  No chest pain, shortness of breath.  He has been trying to maintain health.  He feels like the Vania Rea is really working well for him.   ROS: Per HPI  Allergies  Allergen Reactions   Penicillins Nausea And Vomiting   Past Medical History:  Diagnosis Date   Diabetes mellitus without complication (HCC)    Elevated blood sugar    Esophageal reflux    Essential hypertension, benign    Hyperplasia of prostate    Internal hemorrhoids    Other and unspecified hyperlipidemia    Other testicular hypofunction    Sleep apnea    CPAP    Tubular adenoma of colon    Unspecified glaucoma(365.9)    Unspecified hemorrhoids without mention of complication     Current Outpatient Medications:    amLODipine (NORVASC) 5 MG tablet, TAKE 1 TABLET DAILY, Disp: 90 tablet, Rfl: 0   atorvastatin (LIPITOR) 20 MG tablet, TAKE 1 TABLET DAILY AS DIRECTED,  Disp: 90 tablet, Rfl: 0   bimatoprost (LUMIGAN) 0.01 % SOLN, 1 drop at bedtime., Disp: , Rfl:    Cholecalciferol (VITAMIN D3) 5000 units CAPS, Take 1 capsule by mouth 3 (three) times a week., Disp: , Rfl:    Cinnamon 500 MG TABS, Take 1 tablet by mouth 2 (two) times daily., Disp: , Rfl:    empagliflozin (JARDIANCE) 10 MG TABS tablet, Take 1 tablet (10 mg total) by mouth daily before breakfast., Disp: 90 tablet, Rfl: 3   fluticasone (FLONASE) 50 MCG/ACT nasal spray, Place 2 sprays into both nostrils daily., Disp: 48 g, Rfl: 3   glucose blood (FREESTYLE LITE) test strip, TEST BLOOD SUGAR TWICE A DAY R73.9, Disp: 200 strip, Rfl: 3   hydrochlorothiazide (MICROZIDE) 12.5 MG capsule, Take 1 capsule (12.5 mg total) by mouth daily., Disp: 90 capsule, Rfl: 3   icosapent Ethyl (VASCEPA) 1 g capsule, Take 2 capsules (2 g total) by mouth 2 (two) times daily., Disp: 360 capsule, Rfl: 3   olmesartan (BENICAR) 40 MG tablet, TAKE 1 TABLET DAILY, Disp: 90 tablet, Rfl: 0   OVER THE COUNTER MEDICATION, Allergy relief otc, nasal spray, fluticasone spray, Disp: , Rfl:    testosterone (ANDROGEL) 50 MG/5GM (1%) GEL, PLACE 5 GRAMS  (1 PACKET) ONTO THE SKIN DAILY, Disp: 450 g, Rfl: 1 Social History   Socioeconomic History   Marital status: Married  Spouse name: Not on file   Number of children: Not on file   Years of education: Not on file   Highest education level: Not on file  Occupational History   Not on file  Tobacco Use   Smoking status: Never   Smokeless tobacco: Never  Vaping Use   Vaping Use: Never used  Substance and Sexual Activity   Alcohol use: Not Currently    Comment: VERY RARE   Drug use: No   Sexual activity: Not Currently  Other Topics Concern   Not on file  Social History Narrative   Not on file   Social Determinants of Health   Financial Resource Strain: Not on file  Food Insecurity: Not on file  Transportation Needs: Not on file  Physical Activity: Not on file  Stress: Not on  file  Social Connections: Not on file  Intimate Partner Violence: Not on file   Family History  Problem Relation Age of Onset   Heart disease Mother    Stroke Father    Colon cancer Paternal Uncle        79's   Esophageal cancer Neg Hx    Rectal cancer Neg Hx    Stomach cancer Neg Hx     Objective: Office vital signs reviewed. BP 139/82   Pulse (!) 56   Temp (!) 97.2 F (36.2 C)   Ht $R'5\' 8"'yv$  (1.727 m)   Wt 170 lb 12.8 oz (77.5 kg)   SpO2 95%   BMI 25.97 kg/m   Physical Examination:  General: Awake, alert, well nourished, No acute distress HEENT: Sclera white.  Moist mucous membranes Cardio: Slightly bradycardic with regular rhythm, S1S2 heard, no murmurs appreciated Pulm: clear to auscultation bilaterally, no wheezes, rhonchi or rales; normal work of breathing on room air GI: soft, non-tender, non-distended, bowel sounds present x4, no hepatomegaly, no splenomegaly, no masses GU: No CVA tenderness palpation.  No suprapubic tenderness palpation. Extremities: warm, well perfused, No edema, cyanosis or clubbing; +2 pulses bilaterally Neuro: No tremor  Assessment/ Plan: 66 y.o. male   Urinary frequency - Plan: Urinalysis, Routine w reflex microscopic, PSA, sulfamethoxazole-trimethoprim (BACTRIM DS) 800-160 MG tablet, Urine Culture  Type 2 diabetes mellitus with other specified complication, without long-term current use of insulin (HCC) - Plan: Bayer DCA Hb A1c Waived  Hyperlipidemia associated with type 2 diabetes mellitus ([place]) - Plan: Lipid panel, CMP14+EGFR  Hypertension associated with diabetes ([place]) - Plan: CMP14+EGFR  Testosterone deficiency - Plan: CBC, PSA, Testosterone  I am going to empirically treat him with Bactrim twice daily since he does show some bacteria on urinalysis and he is symptomatic.  He certainly has 3+ glucose I do question possible impact of the Jardiance on his urinary tract.  He has not had any issues up to this point so I favor leaving him on  the Jardiance, awaiting urine culture and reassessing.  He will come in in the next few days for fasting labs and testosterone check.  Blood pressure is stable.  No changes  Orders Placed This Encounter  Procedures   Urinalysis, Routine w reflex microscopic   No orders of the defined types were placed in this encounter.    Janora Norlander, DO [place] [phone]

## 2022-04-12 ENCOUNTER — Other Ambulatory Visit: Payer: Medicare Other

## 2022-04-12 ENCOUNTER — Other Ambulatory Visit: Payer: Self-pay

## 2022-04-12 DIAGNOSIS — E349 Endocrine disorder, unspecified: Secondary | ICD-10-CM

## 2022-04-12 DIAGNOSIS — R350 Frequency of micturition: Secondary | ICD-10-CM

## 2022-04-12 DIAGNOSIS — I152 Hypertension secondary to endocrine disorders: Secondary | ICD-10-CM

## 2022-04-12 DIAGNOSIS — E1169 Type 2 diabetes mellitus with other specified complication: Secondary | ICD-10-CM

## 2022-04-12 LAB — BAYER DCA HB A1C WAIVED: HB A1C (BAYER DCA - WAIVED): 7.1 % — ABNORMAL HIGH (ref 4.8–5.6)

## 2022-04-13 LAB — CMP14+EGFR
ALT: 37 IU/L (ref 0–44)
AST: 20 IU/L (ref 0–40)
Albumin/Globulin Ratio: 2 (ref 1.2–2.2)
Albumin: 4.9 g/dL — ABNORMAL HIGH (ref 3.8–4.8)
Alkaline Phosphatase: 59 IU/L (ref 44–121)
BUN/Creatinine Ratio: 17 (ref 10–24)
BUN: 18 mg/dL (ref 8–27)
Bilirubin Total: 0.6 mg/dL (ref 0.0–1.2)
CO2: 22 mmol/L (ref 20–29)
Calcium: 10.1 mg/dL (ref 8.6–10.2)
Chloride: 99 mmol/L (ref 96–106)
Creatinine, Ser: 1.03 mg/dL (ref 0.76–1.27)
Globulin, Total: 2.5 g/dL (ref 1.5–4.5)
Glucose: 170 mg/dL — ABNORMAL HIGH (ref 70–99)
Potassium: 4.7 mmol/L (ref 3.5–5.2)
Sodium: 137 mmol/L (ref 134–144)
Total Protein: 7.4 g/dL (ref 6.0–8.5)
eGFR: 81 mL/min/{1.73_m2} (ref >59)

## 2022-04-13 LAB — CBC WITH DIFFERENTIAL/PLATELET
Basophils Absolute: 0.1 10*3/uL (ref 0.0–0.2)
Basos: 1 %
EOS (ABSOLUTE): 0.1 10*3/uL (ref 0.0–0.4)
Eos: 1 %
Hematocrit: 47.4 % (ref 37.5–51.0)
Hemoglobin: 16.3 g/dL (ref 13.0–17.7)
Immature Grans (Abs): 0 10*3/uL (ref 0.0–0.1)
Immature Granulocytes: 0 %
Lymphocytes Absolute: 2.4 10*3/uL (ref 0.7–3.1)
Lymphs: 37 %
MCH: 30.8 pg (ref 26.6–33.0)
MCHC: 34.4 g/dL (ref 31.5–35.7)
MCV: 90 fL (ref 79–97)
Monocytes Absolute: 0.5 10*3/uL (ref 0.1–0.9)
Monocytes: 7 %
Neutrophils Absolute: 3.4 10*3/uL (ref 1.4–7.0)
Neutrophils: 54 %
Platelets: 240 10*3/uL (ref 150–450)
RBC: 5.29 x10E6/uL (ref 4.14–5.80)
RDW: 13.2 % (ref 11.6–15.4)
WBC: 6.4 10*3/uL (ref 3.4–10.8)

## 2022-04-13 LAB — LIPID PANEL
Chol/HDL Ratio: 3 ratio (ref 0.0–5.0)
Cholesterol, Total: 151 mg/dL (ref 100–199)
HDL: 50 mg/dL (ref >39)
LDL Chol Calc (NIH): 71 mg/dL (ref 0–99)
Triglycerides: 180 mg/dL — ABNORMAL HIGH (ref 0–149)
VLDL Cholesterol Cal: 30 mg/dL (ref 5–40)

## 2022-04-13 LAB — TESTOSTERONE: Testosterone: 1382 ng/dL — ABNORMAL HIGH (ref 264–916)

## 2022-04-13 LAB — PSA: Prostate Specific Ag, Serum: 2.8 ng/mL (ref 0.0–4.0)

## 2022-04-14 LAB — URINE CULTURE

## 2022-04-17 ENCOUNTER — Telehealth: Payer: Self-pay | Admitting: Family Medicine

## 2022-04-17 DIAGNOSIS — R300 Dysuria: Secondary | ICD-10-CM

## 2022-04-17 NOTE — Telephone Encounter (Signed)
Patient aware and orders placed.

## 2022-04-17 NOTE — Telephone Encounter (Signed)
Patient came in on 6/6 and was given an antibiotic for a UTI, last night he finished the medicine but is still having some discomfort. He would like to know if he needs to come in to leave another urine sample or if another antibiotic can be called in for him. Please call back and advise.

## 2022-04-17 NOTE — Telephone Encounter (Signed)
Yes, please order repeat UA and Urine culture.

## 2022-04-18 ENCOUNTER — Other Ambulatory Visit: Payer: Self-pay

## 2022-04-18 ENCOUNTER — Other Ambulatory Visit: Payer: Medicare Other

## 2022-04-18 DIAGNOSIS — R300 Dysuria: Secondary | ICD-10-CM

## 2022-04-18 LAB — URINALYSIS, COMPLETE
Bilirubin, UA: NEGATIVE
Ketones, UA: NEGATIVE
Leukocytes,UA: NEGATIVE
Nitrite, UA: NEGATIVE
Protein,UA: NEGATIVE
RBC, UA: NEGATIVE
Specific Gravity, UA: 1.015 (ref 1.005–1.030)
Urobilinogen, Ur: 0.2 mg/dL (ref 0.2–1.0)
pH, UA: 5.5 (ref 5.0–7.5)

## 2022-04-18 LAB — MICROSCOPIC EXAMINATION
Bacteria, UA: NONE SEEN
Epithelial Cells (non renal): NONE SEEN /hpf (ref 0–10)
RBC, Urine: NONE SEEN /hpf (ref 0–2)
Renal Epithel, UA: NONE SEEN /hpf
WBC, UA: NONE SEEN /hpf (ref 0–5)

## 2022-04-20 LAB — URINE CULTURE

## 2022-06-02 DIAGNOSIS — K649 Unspecified hemorrhoids: Secondary | ICD-10-CM | POA: Insufficient documentation

## 2022-06-02 DIAGNOSIS — H409 Unspecified glaucoma: Secondary | ICD-10-CM | POA: Insufficient documentation

## 2022-06-19 ENCOUNTER — Other Ambulatory Visit: Payer: Self-pay | Admitting: Family Medicine

## 2022-06-19 ENCOUNTER — Telehealth: Payer: Self-pay | Admitting: Family Medicine

## 2022-06-19 DIAGNOSIS — E1169 Type 2 diabetes mellitus with other specified complication: Secondary | ICD-10-CM

## 2022-06-19 MED ORDER — METFORMIN HCL ER 500 MG PO TB24: 500.0000 mg | ORAL_TABLET | Freq: Every day | ORAL | 3 refills | Status: DC

## 2022-06-19 NOTE — Progress Notes (Signed)
DC jardiance due to recurrent UTI. Metformin sent.

## 2022-06-19 NOTE — Telephone Encounter (Signed)
Patient aware.

## 2022-06-19 NOTE — Telephone Encounter (Signed)
done

## 2022-06-19 NOTE — Telephone Encounter (Signed)
Pt is having side effects while taking jardiance--urinary problems. Pt asking if he cango back on metformin. Pt [facility]

## 2022-06-25 ENCOUNTER — Other Ambulatory Visit: Payer: Self-pay | Admitting: Family Medicine

## 2022-06-25 DIAGNOSIS — E349 Endocrine disorder, unspecified: Secondary | ICD-10-CM

## 2022-06-25 MED ORDER — TESTOSTERONE 50 MG/5GM (1%) TD GEL: TRANSDERMAL | 1 refills | Status: DC

## 2022-06-25 NOTE — Telephone Encounter (Signed)
done

## 2022-06-25 NOTE — Telephone Encounter (Signed)
  Prescription Request  06/25/2022  Is this a "Controlled Substance" medicine? NO  Have you seen your PCP in the last 2 weeks? NO  If YES, route message to pool  -  If NO, patient needs to be scheduled for appointment.  What is the name of the medication or equipment? testosterone (ANDROGEL) 50 MG/5GM (1%) GEL  Have you contacted your pharmacy to request a refill? YES    Which pharmacy would you like this sent to? [facility], [place]   Patient notified that their request is being sent to the clinical staff for review and that they should receive a response within 2 business days.

## 2022-08-01 ENCOUNTER — Ambulatory Visit (INDEPENDENT_AMBULATORY_CARE_PROVIDER_SITE_OTHER): Payer: Medicare Other

## 2022-08-01 DIAGNOSIS — Z23 Encounter for immunization: Secondary | ICD-10-CM | POA: Diagnosis not present

## 2022-09-07 ENCOUNTER — Telehealth: Payer: Self-pay | Admitting: Family Medicine

## 2022-09-07 NOTE — Telephone Encounter (Signed)
Active orders are in. Pt made aware.

## 2022-09-10 ENCOUNTER — Encounter: Payer: Self-pay | Admitting: Family Medicine

## 2022-09-10 ENCOUNTER — Ambulatory Visit (INDEPENDENT_AMBULATORY_CARE_PROVIDER_SITE_OTHER): Payer: Medicare Other | Admitting: Family Medicine

## 2022-09-10 VITALS — BP 110/66 | HR 73 | Temp 98.3°F | Ht 68.0 in | Wt 170.4 lb

## 2022-09-10 DIAGNOSIS — Z23 Encounter for immunization: Secondary | ICD-10-CM

## 2022-09-10 DIAGNOSIS — E1159 Type 2 diabetes mellitus with other circulatory complications: Secondary | ICD-10-CM

## 2022-09-10 DIAGNOSIS — H9193 Unspecified hearing loss, bilateral: Secondary | ICD-10-CM | POA: Diagnosis not present

## 2022-09-10 DIAGNOSIS — E1169 Type 2 diabetes mellitus with other specified complication: Secondary | ICD-10-CM | POA: Diagnosis not present

## 2022-09-10 DIAGNOSIS — E785 Hyperlipidemia, unspecified: Secondary | ICD-10-CM

## 2022-09-10 DIAGNOSIS — Z0000 Encounter for general adult medical examination without abnormal findings: Secondary | ICD-10-CM

## 2022-09-10 DIAGNOSIS — E349 Endocrine disorder, unspecified: Secondary | ICD-10-CM

## 2022-09-10 DIAGNOSIS — I152 Hypertension secondary to endocrine disorders: Secondary | ICD-10-CM

## 2022-09-10 LAB — BAYER DCA HB A1C WAIVED: HB A1C (BAYER DCA - WAIVED): 7.2 % — ABNORMAL HIGH (ref 4.8–5.6)

## 2022-09-10 MED ORDER — METFORMIN HCL ER 500 MG PO TB24: 1000.0000 mg | ORAL_TABLET | Freq: Every day | ORAL | 3 refills | Status: DC

## 2022-09-10 MED ORDER — TESTOSTERONE 50 MG/5GM (1%) TD GEL: TRANSDERMAL | 1 refills | Status: DC

## 2022-09-10 NOTE — Progress Notes (Signed)
I have separately seen and examined the patient. I have discussed the findings and exam with student Dr Terry [name] and agree with the below note.  My changes/additions are outlined in BLUE.    S: Patient reports that he has been doing pretty well.  He is compliant with testosterone every other day.  Just had his 30-day refill completed.  Came in this morning for labs.  He admits that his blood sugars started rising up into the 200s after COVID infection 1 month ago.  He was not as active and was not really eating as he normally does during that time.  He has since gotten back into exercising regularly and a more restrictive, low-carb diet.  He also increased his metformin to 2 tablets daily and this has brought his sugars down to 150s.  Compliant with all other medications.  No reports of blurred vision, polydipsia, polyuria.  Genital issues that were ongoing with Jardiance have pretty much resolved.  He is still having difficulty losing his last 5 pounds but he is hoping that as he continues lifestyle modification he will see that weight come off  ROS: As above and positive for mild hearing loss.  O: Vitals:   09/10/22 1251  BP: 110/66  Pulse: 73  Temp: 98.3 F (36.8 C)  SpO2: 96%    BP 110/66   Pulse 73   Temp 98.3 F (36.8 C)   Ht '5\' 8"'$  (1.727 m)   Wt 170 lb 6.4 oz (77.3 kg)   SpO2 96%   BMI 25.91 kg/m  General appearance: alert, cooperative, appears stated age, and no distress Head: Normocephalic, without obvious abnormality, atraumatic Eyes: negative findings: lids and lashes normal, conjunctivae and sclerae normal, corneas clear, and pupils equal, round, reactive to light and accomodation Ears: normal TM's and external ear canals both ears Nose: Nares normal. Septum midline. Mucosa normal. No drainage or sinus tenderness. Throat: lips, mucosa, and tongue normal; teeth and gums normal Neck: no adenopathy, supple, symmetrical, trachea midline, and thyroid not enlarged, symmetric, no  tenderness/mass/nodules Back: symmetric, no curvature. ROM normal. No CVA tenderness. Lungs: clear to auscultation bilaterally Chest wall: no tenderness Heart: regular rate and rhythm, S1, S2 normal, no murmur, click, rub or gallop Abdomen: soft, non-tender; bowel sounds normal; no masses,  no organomegaly Extremities: extremities normal, atraumatic, no cyanosis or edema Pulses: 2+ and symmetric Skin:  Multiple areas of pigmented nevi Lymph nodes: Cervical, supraclavicular, and axillary nodes normal. Neurologic: Grossly normal Psych: Mood stable, speech normal, affect appropriate     09/10/2022    1:37 PM 04/10/2022   10:20 AM 07/20/2021   11:27 AM  Depression screen PHQ 2/9  Decreased Interest 0 0 0  Down, Depressed, Hopeless 0 0 0  PHQ - 2 Score 0 0 0  Altered sleeping   0  Tired, decreased energy   0  Change in appetite   0  Feeling bad or failure about yourself    0  Trouble concentrating   0  Moving slowly or fidgety/restless   0  Suicidal thoughts   0  PHQ-9 Score   0  Difficult doing work/chores   Not difficult at all   Diabetic Foot Exam - Simple   Simple Foot Form Diabetic Foot exam was performed with the following findings: Yes 09/10/2022  2:02 PM  Visual Inspection No deformities, no ulcerations, no other skin breakdown bilaterally: Yes Sensation Testing Intact to touch and monofilament testing bilaterally: Yes Pulse Check Posterior Tibialis and Dorsalis pulse  intact bilaterally: Yes Comments       A/P:  Type 2 diabetes mellitus with other specified complication, without long-term current use of insulin ([place]) - Plan: metFORMIN (GLUCOPHAGE-XR) 500 MG 24 hr tablet  Need for shingles vaccine  Annual physical exam  Hyperlipidemia associated with type 2 diabetes mellitus ([place])  Hypertension associated with diabetes ([place]) - Plan: Bayer DCA Hb A1c Waived  Testosterone deficiency - Plan: Testosterone, testosterone (ANDROGEL) 50 MG/5GM (1%) GEL  Hearing  difficulty of both ears - Plan: Ambulatory referral to Audiology  Doing very well.  Testosterone level, A1c collected this morning.  Not due for fasting lipid.  Agree with advanced dose of metformin given uncontrolled A1c at 7.2.  Metformin increased to 2 tablets daily.  Plan for urine microalbumin next visit.  Foot exam performed today  Shingles vaccination administered today.  Blood pressure well controlled.  No changes  Testosterone renewed.  Plan for PSA, CBC with next testosterone level  Referral to audiology for further evaluation of his hearing difficulties  Terry Onofre M. Lajuana Ripple, DO [facility]   -------------------------------------------------------------------------------------------------------------------------------------------------------------------------------------       Terry [name] is a 66 y.o. male presents to office today for annual physical exam examination.    In general, things are going well. 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   Concerns today include:  Type 2 Diabetes Patient reports that he had a period of high blood pressures (highs around 210) following infection with COVID-19 and discontinuation of empagliflozin. Patient has been compliant with an increased dose of metformin 500 mg BID. Additionally, patient has implemented a strict diet and exercise regiment including daily long walks or using elliptical, as well as avoiding carbohydrates. He has also focused on decreasing stress. Patient measures his blood sugar levels at home and reports that they have fallen slightly with recent high of 164, low of 89.   High at home: 164; Low at home: 45, Taking medication(s): Metformin 500 mg BID,.  Last eye exam: UTD (Last week) Last foot exam: UTD (Today)  Last A1c:  Lab Results  Component Value Date   HGBA1C 7.2 (H) 09/10/2022   Nephropathy screen indicated?:  No  Last flu, zoster and/or pneumovax:  Immunization History  Administered Date(s) Administered   Fluad Quad(high Dose 65+) 08/19/2020, 07/20/2021, 08/01/2022   Influenza Split 10/13/2015   Influenza Whole 08/01/2010   Influenza,inj,Quad PF,6+ Mos 08/01/2016, 08/30/2017, 10/14/2018, 08/11/2019   Moderna Covid-19 Vaccine Bivalent Booster 43yr & up 08/08/2021   Moderna Sars-Covid-2 Vaccination 01/05/2020, 02/02/2020, 02/28/2021   PNEUMOCOCCAL CONJUGATE-20 10/16/2021   Pneumococcal Polysaccharide-23 08/20/2019   Tdap 03/10/2008, 07/02/2018   Zoster Recombinat (Shingrix) 09/10/2022   Zoster, Live 01/11/2017    ROS: No dizziness, LOC, polyuria, polydipsia, unintended weight loss/gain, foot ulcerations, numbness or tingling in extremities, shortness of breath or chest [name].  Testosterone Patient continues to take Androgel for hypotestosteronemia. Does not report significant changes to energy or mood, skin irritation, or gynecomastia.   Occupation: PTheme park manager Marital status: Married, Substance use: No Diet: Low carb, Exercise: daily  Last eye exam: UTD Last dental exam: UTD  Last colonoscopy: UTD (Due 06/2023) Refills needed today: Metformin Immunizations needed: Immunization History  Administered Date(s) Administered   Fluad Quad(high Dose 65+) 08/19/2020, 07/20/2021, 08/01/2022   Influenza Split 10/13/2015   Influenza Whole 08/01/2010   Influenza,inj,Quad PF,6+ Mos 08/01/2016, 08/30/2017, 10/14/2018, 08/11/2019   Moderna Covid-19 Vaccine Bivalent Booster 18yr& up 08/08/2021   Moderna Sars-Covid-2  Vaccination 01/05/2020, 02/02/2020, 02/28/2021   PNEUMOCOCCAL CONJUGATE-20 10/16/2021   Pneumococcal Polysaccharide-23 08/20/2019   Tdap 03/10/2008, 07/02/2018   Zoster Recombinat (Shingrix) 09/10/2022   Zoster, Live 01/11/2017   Past Medical History:  Diagnosis Date   Diabetes mellitus without complication (HCC)    Elevated blood sugar    Esophageal reflux    Essential  hypertension, benign    Hyperplasia of prostate    Internal hemorrhoids    Other and unspecified hyperlipidemia    Other testicular hypofunction    Sleep apnea    CPAP    Tubular adenoma of colon    Unspecified glaucoma(365.9)    Unspecified hemorrhoids without mention of complication    Social History   Socioeconomic History   Marital status: Married    Spouse name: Not on file   Number of children: Not on file   Years of education: Not on file   Highest education level: Not on file  Occupational History   Not on file  Tobacco Use   Smoking status: Never   Smokeless tobacco: Never  Vaping Use   Vaping Use: Never used  Substance and Sexual Activity   Alcohol use: Not Currently    Comment: VERY RARE   Drug use: No   Sexual activity: Not Currently  Other Topics Concern   Not on file  Social History Narrative   Not on file   Social Determinants of Health   Financial Resource Strain: Not on file  Food Insecurity: Not on file  Transportation Needs: Not on file  Physical Activity: Not on file  Stress: Not on file  Social Connections: Not on file  Intimate Partner Violence: Not on file   Past Surgical History:  Procedure Laterality Date   COLONOSCOPY     LIPOMA EXCISION Right 2000   shoulder   Family History  Problem Relation Age of Onset   Heart disease Mother    Stroke Father    Colon cancer Paternal Uncle        12's   Esophageal cancer Neg Hx    Rectal cancer Neg Hx    Stomach cancer Neg Hx     Current Outpatient Medications:    amLODipine (NORVASC) 5 MG tablet, Take 1 tablet (5 mg total) by mouth daily., Disp: 90 tablet, Rfl: 3   atorvastatin (LIPITOR) 20 MG tablet, Take 1 tablet (20 mg total) by mouth daily. as directed, Disp: 90 tablet, Rfl: 3   bimatoprost (LUMIGAN) 0.01 % SOLN, 1 drop at bedtime., Disp: , Rfl:    Cholecalciferol (VITAMIN D3) 5000 units CAPS, Take 1 capsule by mouth 3 (three) times a week., Disp: , Rfl:    Cinnamon 500 MG TABS,  Take 1 tablet by mouth 2 (two) times daily., Disp: , Rfl:    fluticasone (FLONASE) 50 MCG/ACT nasal spray, Place 2 sprays into both nostrils daily., Disp: 48 g, Rfl: 3   glucose blood (FREESTYLE LITE) test strip, TEST BLOOD SUGAR TWICE A DAY R73.9, Disp: 200 strip, Rfl: 3   hydrochlorothiazide (MICROZIDE) 12.5 MG capsule, Take 1 capsule (12.5 mg total) by mouth daily., Disp: 90 capsule, Rfl: 3   icosapent Ethyl (VASCEPA) 1 g capsule, Take 2 capsules (2 g total) by mouth 2 (two) times daily., Disp: 360 capsule, Rfl: 3   olmesartan (BENICAR) 40 MG tablet, Take 1 tablet (40 mg total) by mouth daily., Disp: 90 tablet, Rfl: 3   OVER THE COUNTER MEDICATION, Allergy relief otc, nasal spray, fluticasone spray, Disp: , Rfl:  metFORMIN (GLUCOPHAGE-XR) 500 MG 24 hr tablet, Take 2 tablets (1,000 mg total) by mouth daily with breakfast., Disp: 180 tablet, Rfl: 3   testosterone (ANDROGEL) 50 MG/5GM (1%) GEL, PLACE 5 GRAMS  (1 PACKET) ONTO THE SKIN EVERY OTHER DAY, Disp: 450 g, Rfl: 1  Allergies  Allergen Reactions   Jardiance [Empagliflozin]     Recurrent UTI   Penicillins Nausea And Vomiting     ROS: Review of Systems Pertinent items noted in HPI and remainder of comprehensive ROS otherwise negative.    Physical exam BP 110/66   Pulse 73   Temp 98.3 F (36.8 C)   Ht '5\' 8"'$  (1.727 m)   Wt 170 lb 6.4 oz (77.3 kg)   SpO2 96%   BMI 25.91 kg/m  General appearance: alert, cooperative, and appears stated age Head: Normocephalic, without obvious abnormality, atraumatic Eyes: conjunctivae/corneas clear. PERRL, EOM's intact. Ears: normal TM's and external ear canals both ears Nose: Nares normal. Septum midline. Mucosa normal. No drainage or sinus tenderness. Throat: lips, mucosa, and tongue normal; teeth and gums normal Neck: no adenopathy, no carotid bruit, no JVD, supple, symmetrical, trachea midline, and thyroid not enlarged, symmetric, no tenderness/mass/nodules Lungs: clear to auscultation  bilaterally and normal percussion bilaterally Heart: regular rate and rhythm, S1, S2 normal, no murmur, click, rub or gallop and normal apical impulse Abdomen: soft, non-tender; bowel sounds normal; no masses,  no organomegaly Extremities: extremities normal, atraumatic, no cyanosis or edema Pulses: 2+ and symmetric   Assessment/ Plan: Terry [name] here for annual physical exam.   No problem-specific Assessment & Plan notes found for this encounter.  Patient increased his dose of metformin on his own on 09/01/2022. Given recent high blood sugar levels, this new medication regimen, along with his new diet and exercise routine, is appropriate for blood sugar management.  Terry [name] is not appropriate agent for this patient given that he reported significant GU side effects with this medication, including UTI symptoms and penile swelling. Plan for microalbuminuria test at next visit.   Patient is tolerating Androgel application every other day. Plan to check testosterone levels again in 6 months.  Given recent COVID infection, we discussed that the patient should plan to get COVID booster in one month.   Counseled on healthy lifestyle choices, including diet (rich in fruits, vegetables and lean meats and low in salt and simple carbohydrates) and exercise (at least 30 minutes of moderate physical activity daily).  Patient to follow up in 1 year for annual exam or sooner if needed.   Stephani Police, MS3

## 2022-09-10 NOTE — Patient Instructions (Signed)
Preventive Care 65 Years and Older, Male Preventive care refers to lifestyle choices and visits with your health care provider that can promote health and wellness. Preventive care visits are also called wellness exams. What can I expect for my preventive care visit? Counseling During your preventive care visit, your health care provider may ask about your: Medical history, including: Past medical problems. Family medical history. History of falls. Current health, including: Emotional well-being. Home life and relationship well-being. Sexual activity. Memory and ability to understand (cognition). Lifestyle, including: Alcohol, nicotine or tobacco, and drug use. Access to firearms. Diet, exercise, and sleep habits. Work and work environment. Sunscreen use. Safety issues such as seatbelt and bike helmet use. Physical exam Your health care provider will check your: Height and weight. These may be used to calculate your BMI (body mass index). BMI is a measurement that tells if you are at a healthy weight. Waist circumference. This measures the distance around your waistline. This measurement also tells if you are at a healthy weight and may help predict your risk of certain diseases, such as type 2 diabetes and high blood pressure. Heart rate and blood pressure. Body temperature. Skin for abnormal spots. What immunizations do I need?  Vaccines are usually given at various ages, according to a schedule. Your health care provider will recommend vaccines for you based on your age, medical history, and lifestyle or other factors, such as travel or where you work. What tests do I need? Screening Your health care provider may recommend screening tests for certain conditions. This may include: Lipid and cholesterol levels. Diabetes screening. This is done by checking your blood sugar (glucose) after you have not eaten for a while (fasting). Hepatitis C test. Hepatitis B test. HIV (human  immunodeficiency virus) test. STI (sexually transmitted infection) testing, if you are at risk. Lung cancer screening. Colorectal cancer screening. Prostate cancer screening. Abdominal aortic aneurysm (AAA) screening. You may need this if you are a current or former smoker. Talk with your health care provider about your test results, treatment options, and if necessary, the need for more tests. Follow these instructions at home: Eating and drinking  Eat a diet that includes fresh fruits and vegetables, whole grains, lean protein, and low-fat dairy products. Limit your intake of foods with high amounts of sugar, saturated fats, and salt. Take vitamin and mineral supplements as recommended by your health care provider. Do not drink alcohol if your health care provider tells you not to drink. If you drink alcohol: Limit how much you have to 0-2 drinks a day. Know how much alcohol is in your drink. In the [place] one drink equals one 12 oz bottle of beer (355 mL), one 5 oz glass of wine (148 mL), or one 1 oz glass of hard liquor (44 mL). Lifestyle Brush your teeth every morning and night with fluoride toothpaste. Floss one time each day. Exercise for at least 30 minutes 5 or more days each week. Do not use any products that contain nicotine or tobacco. These products include cigarettes, chewing tobacco, and vaping devices, such as e-cigarettes. If you need help quitting, ask your health care provider. Do not use drugs. If you are sexually active, practice safe sex. Use a condom or other form of protection to prevent STIs. Take aspirin only as told by your health care provider. Make sure that you understand how much to take and what form to take. Work with your health care provider to find out whether it is safe   and beneficial for you to take aspirin daily. Ask your health care provider if you need to take a cholesterol-lowering medicine (statin). Find healthy ways to manage stress, such  as: Meditation, yoga, or listening to music. Journaling. Talking to a trusted person. Spending time with friends and family. Safety Always wear your seat belt while driving or riding in a vehicle. Do not drive: If you have been drinking alcohol. Do not ride with someone who has been drinking. When you are tired or distracted. While texting. If you have been using any mind-altering substances or drugs. Wear a helmet and other protective equipment during sports activities. If you have firearms in your house, make sure you follow all gun safety procedures. Minimize exposure to UV radiation to reduce your risk of skin cancer. What's next? Visit your health care provider once a year for an annual wellness visit. Ask your health care provider how often you should have your eyes and teeth checked. Stay up to date on all vaccines. This information is not intended to replace advice given to you by your health care provider. Make sure you discuss any questions you have with your health care provider. Document Revised: 04/19/2021 Document Reviewed: 04/19/2021 Elsevier Patient Education  2023 [employer]  

## 2022-09-11 LAB — TESTOSTERONE: Testosterone: 451 ng/dL (ref 264–916)

## 2022-10-15 ENCOUNTER — Ambulatory Visit (INDEPENDENT_AMBULATORY_CARE_PROVIDER_SITE_OTHER): Payer: Medicare Other

## 2022-10-15 VITALS — Ht 68.0 in | Wt 171.0 lb

## 2022-10-15 DIAGNOSIS — Z0000 Encounter for general adult medical examination without abnormal findings: Secondary | ICD-10-CM | POA: Diagnosis not present

## 2022-10-15 NOTE — Progress Notes (Signed)
Subjective:   Terry [name] is a 66 y.o. male who presents for an Initial Medicare Annual Wellness Visit.  I connected with  Terry [name] on 10/15/22 by a audio enabled telemedicine application and verified that I am speaking with the correct person using two identifiers.  Patient Location: Home  Provider Location: Home Office  I discussed the limitations of evaluation and management by telemedicine. The patient expressed understanding and agreed to proceed.  Review of Systems     Cardiac Risk Factors include: advanced age (>25mn, >>60women);diabetes mellitus;dyslipidemia;hypertension;male gender     Objective:    Today's Vitals   10/15/22 1019  Weight: 171 lb (77.6 kg)  Height: _0  (1.727 m)   Body mass index is 26 kg/m.     10/15/2022   10:13 AM  Advanced Directives  Does Patient Have a Medical Advance Directive? Yes  Type of [place]ive [place] Does patient want to make changes to medical advance directive? No - Patient declined  Copy of [place] Chart? No - copy requested    Current Medications (verified) Outpatient Encounter Medications as of 10/15/2022  Medication Sig   amLODipine (NORVASC) 5 MG tablet Take 1 tablet (5 mg total) by mouth daily.   atorvastatin (LIPITOR) 20 MG tablet Take 1 tablet (20 mg total) by mouth daily. as directed   bimatoprost (LUMIGAN) 0.01 % SOLN 1 drop at bedtime.   Cholecalciferol (VITAMIN D3) 5000 units CAPS Take 1 capsule by mouth 3 (three) times a week.   Cinnamon 500 MG TABS Take 1 tablet by mouth 2 (two) times daily.   fluticasone (FLONASE) 50 MCG/ACT nasal spray Place 2 sprays into both nostrils daily.   glucose blood (FREESTYLE LITE) test strip TEST BLOOD SUGAR TWICE A DAY R73.9   hydrochlorothiazide (MICROZIDE) 12.5 MG capsule Take 1 capsule (12.5 mg total) by mouth daily.   icosapent Ethyl (VASCEPA) 1 g capsule Take 2 capsules (2 g total) by mouth 2 (two) times  daily.   metFORMIN (GLUCOPHAGE-XR) 500 MG 24 hr tablet Take 2 tablets (1,000 mg total) by mouth daily with breakfast.   olmesartan (BENICAR) 40 MG tablet Take 1 tablet (40 mg total) by mouth daily.   OVER THE COUNTER MEDICATION Allergy relief otc, nasal spray, fluticasone spray   testosterone (ANDROGEL) 50 MG/5GM (1%) GEL PLACE 5 GRAMS  (1 PACKET) ONTO THE SKIN EVERY OTHER DAY   No facility-administered encounter medications on file as of 10/15/2022.    Allergies (verified) Jardiance [empagliflozin] and Penicillins   History: Past Medical History:  Diagnosis Date   Diabetes mellitus without complication (HCC)    Elevated blood sugar    Esophageal reflux    Essential hypertension, benign    Hyperplasia of prostate    Internal hemorrhoids    Other and unspecified hyperlipidemia    Other testicular hypofunction    Sleep apnea    CPAP    Tubular adenoma of colon    Unspecified glaucoma(365.9)    Unspecified hemorrhoids without mention of complication    Past Surgical History:  Procedure Laterality Date   COLONOSCOPY     LIPOMA EXCISION Right 2000   shoulder   Family History  Problem Relation Age of Onset   Heart disease Mother    Stroke Father    Colon cancer Paternal Uncle        634's  Esophageal cancer Neg Hx    Rectal cancer Neg Hx    Stomach cancer  Neg Hx    Social History   Socioeconomic History   Marital status: Married    Spouse name: Not on file   Number of children: Not on file   Years of education: Not on file   Highest education level: Not on file  Occupational History   Not on file  Tobacco Use   Smoking status: Never   Smokeless tobacco: Never  Vaping Use   Vaping Use: Never used  Substance and Sexual Activity   Alcohol use: Not Currently    Comment: VERY RARE   Drug use: No   Sexual activity: Not Currently  Other Topics Concern   Not on file  Social History Narrative   Not on file   Social Determinants of Health   Financial Resource  Strain: Low Risk  (10/15/2022)   Overall Financial Resource Strain (CARDIA)    Difficulty of Paying Living Expenses: Not hard at all  Food Insecurity: No Food Insecurity (10/15/2022)   Hunger Vital Sign    Worried About Running Out of Food in the Last Year: Never true    [place] in the Last Year: Never true  Transportation Needs: No Transportation Needs (10/15/2022)   PRAPARE - Hydrologist (Medical): No    Lack of Transportation (Non-Medical): No  Physical Activity: Sufficiently Active (10/15/2022)   Exercise Vital Sign    Days of Exercise per Week: 5 days    Minutes of Exercise per Session: 60 min  Stress: No Stress Concern Present (10/15/2022)   [place]    Feeling of Stress : Not at all  Social Connections: [place] (10/15/2022)   Social Connection and Isolation Panel [NHANES]    Frequency of Communication with Friends and Family: More than three times a week    Frequency of Social Gatherings with Friends and Family: Three times a week    Attends Religious Services: More than 4 times per year    Active Member of Clubs or Organizations: Yes    Attends Music therapist: More than 4 times per year    Marital Status: Married    Tobacco Counseling Counseling given: Not Answered   Clinical Intake:  Pre-visit preparation completed: Yes  Pain : No/denies pain  Diabetes: Yes CBG done?: No Did pt. bring in CBG monitor from home?: No  How often do you need to have someone help you when you read instructions, pamphlets, or other written materials from your doctor or pharmacy?: 1 - Never  Diabetic?Yes Nutrition Risk Assessment:  Has the patient had any N/V/D within the last 2 months?  No  Does the patient have any non-healing wounds?  No  Has the patient had any unintentional weight loss or weight gain?  No   Diabetes:  Is the patient diabetic?   Yes  If diabetic, was a CBG obtained today?  No  Did the patient bring in their glucometer from home?  No  How often do you monitor your CBG's? daily.   Financial Strains and Diabetes Management:  Are you having any financial strains with the device, your supplies or your medication? No .  Does the patient want to be seen by Chronic Care Management for management of their diabetes?  No  Would the patient like to be referred to a Nutritionist or for Diabetic Management?  No   Diabetic Exams:  Diabetic Eye Exam: Completed 11/17/21 Diabetic Foot Exam: Completed 09/10/22  Interpreter Needed?: No  Information entered by :: Denman George LPN   Activities of Daily Living    10/15/2022    9:50 AM 10/11/2022    9:43 AM  In your present state of health, do you have any difficulty performing the following activities:  Hearing? 0 0  Vision? 0 0  Difficulty concentrating or making decisions? 0 0  Walking or climbing stairs? 0 0  Dressing or bathing? 0 0  Doing errands, shopping? 0 0  Preparing Food and eating ? N N  Using the Toilet? N N  In the past six months, have you accidently leaked urine? N N  Do you have problems with loss of bowel control? N N  Managing your Medications? N N  Managing your Finances? N N  Housekeeping or managing your Housekeeping? N N    Patient Care Team: Janora Norlander, DO as PCP - General (Family Medicine)  Indicate any recent Medical Services you may have received from other than [facility] providers in the past year (date may be approximate).     Assessment:   This is a routine wellness examination for [place].  Hearing/Vision screen Hearing Screening - Comments:: No concerns  Vision Screening - Comments:: Wears rx glasses - up to date with routine eye exams with Bath issues and exercise activities discussed: Current Exercise Habits: Home exercise routine, Type of exercise: walking, Time (Minutes): 60, Frequency  (Times/Week): 3, Weekly Exercise (Minutes/Week): 180, Intensity: Moderate   Goals Addressed             This Visit's Progress    HEMOGLOBIN A1C < 7        Depression Screen    10/15/2022   10:11 AM 09/10/2022    1:37 PM 04/10/2022   10:20 AM 07/20/2021   11:27 AM 03/15/2021   11:07 AM 07/12/2020    8:11 AM 01/05/2020    8:41 AM  PHQ 2/9 Scores  PHQ - 2 Score 0 0 0 0 0 0 0  PHQ- 9 Score    0  0 0    Fall Risk    10/15/2022    9:52 AM 10/15/2022    9:49 AM 10/11/2022    9:43 AM 09/10/2022    1:37 PM 04/10/2022   10:20 AM  Fall Risk   Falls in the past year? 0 0 0 0 0  Number falls in past yr: 0 0 0    Injury with Fall? 0 0 0    Risk for fall due to : No Fall Risks No Fall Risks     Follow up Falls evaluation completed;Education provided;Falls prevention discussed Falls evaluation completed;Education provided;Falls prevention discussed       FALL RISK PREVENTION PERTAINING TO THE HOME:  Any stairs in or around the home? Yes  If so, are there any without handrails? No  Home free of loose throw rugs in walkways, pet beds, electrical cords, etc? Yes  Adequate lighting in your home to reduce risk of falls? Yes   ASSISTIVE DEVICES UTILIZED TO PREVENT FALLS:  Life alert? No  Use of a cane, walker or w/c? No  Grab bars in the bathroom? Yes  Shower chair or bench in shower? No  Elevated toilet seat or a handicapped toilet? No   TIMED UP AND GO:  Was the test performed? No .  Length of time to ambulate 10 feet: telephonic visit   Cognitive Function:  10/15/2022    9:50 AM  6CIT Screen  What Year? 0 points  What month? 0 points  What time? 0 points  Count back from 20 0 points  Months in reverse 0 points  Repeat phrase 0 points  Total Score 0 points    Immunizations Immunization History  Administered Date(s) Administered   Fluad Quad(high Dose 65+) 08/19/2020, 07/20/2021, 08/01/2022   Influenza Split 10/13/2015   Influenza Whole 08/01/2010    Influenza,inj,Quad PF,6+ Mos 08/01/2016, 08/30/2017, 10/14/2018, 08/11/2019   Moderna Covid-19 Vaccine Bivalent Booster 49yr & up 08/08/2021   Moderna Sars-Covid-2 Vaccination 01/05/2020, 02/02/2020, 02/28/2021   PNEUMOCOCCAL CONJUGATE-20 10/16/2021   Pneumococcal Polysaccharide-23 08/20/2019   Tdap 03/10/2008, 07/02/2018   Zoster Recombinat (Shingrix) 09/10/2022   Zoster, Live 01/11/2017    TDAP status: Up to date  Flu Vaccine status: Up to date  Pneumococcal vaccine status: Up to date  Covid-19 vaccine status: Completed vaccines  Qualifies for Shingles Vaccine? Yes   Zostavax completed No   Shingrix Completed?: Yes  Screening Tests Health Maintenance  Topic Date Due   Diabetic kidney evaluation - Urine ACR  Never done   COVID-19 Vaccine (5 - 2023-24 season) 07/06/2022   Zoster Vaccines- Shingrix (2 of 2) 11/05/2022   OPHTHALMOLOGY EXAM  11/17/2022   HEMOGLOBIN A1C  03/11/2023   Diabetic kidney evaluation - eGFR measurement  04/13/2023   COLONOSCOPY (Pts 45-436yrInsurance coverage will need to be confirmed)  07/01/2023   FOOT EXAM  09/11/2023   Medicare Annual Wellness (AWV)  10/16/2023   DTaP/Tdap/Td (3 - Td or Tdap) 07/02/2028   Pneumonia Vaccine 6524Years old  Completed   INFLUENZA VACCINE  Completed   Hepatitis C Screening  Completed   HPV VACCINES  Aged Out    Health Maintenance  Health Maintenance Due  Topic Date Due   Diabetic kidney evaluation - Urine ACR  Never done   COVID-19 Vaccine (5 - 2023-24 season) 07/06/2022    Colorectal cancer screening: Type of screening: Colonoscopy. Completed 06/30/18. Repeat every 5 years  Lung Cancer Screening: (Low Dose CT Chest recommended if Age 66-80ears, 30 pack-year currently smoking OR have quit w/in 15years.) does not qualify.   Lung Cancer Screening Referral: n/a  Additional Screening:  Hepatitis C Screening: does qualify; Completed 08/30/17  Vision Screening: Recommended annual ophthalmology exams for  early detection of glaucoma and other disorders of the eye. Is the patient up to date with their annual eye exam?  Yes  Who is the provider or what is the name of the office in which the patient attends annual eye exams? [facility] pt is not established with a provider, would they like to be referred to a provider to establish care? No .   Dental Screening: Recommended annual dental exams for proper oral hygiene  Community Resource Referral / Chronic Care Management: CRR required this visit?  No   CCM required this visit?  No      Plan:     I have personally reviewed and noted the following in the patient's chart:   Medical and social history Use of alcohol, tobacco or illicit drugs  Current medications and supplements including opioid prescriptions. Patient is not currently taking opioid prescriptions. Functional ability and status Nutritional status Physical activity Advanced directives List of other physicians Hospitalizations, surgeries, and ER visits in previous 12 months Vitals Screenings to include cognitive, depression, and falls Referrals and appointments  In addition, I have reviewed and discussed with patient certain preventive  protocols, quality metrics, and best practice recommendations. A written personalized care plan for preventive services as well as general preventive health recommendations were provided to patient.     Vanetta Mulders, LPN   [id number]   Due to this being a virtual visit, the after visit summary with patients personalized plan was offered to patient via mail or my-chart. Patient would like to access on my-chart  Nurse Notes: No concerns

## 2022-10-15 NOTE — Patient Instructions (Signed)
Terry [name] , Thank you for taking time to come for your Medicare Wellness Visit. I appreciate your ongoing commitment to your health goals. Please review the following plan we discussed and let me know if I can assist you in the future.   These are the goals we discussed:  Goals      HEMOGLOBIN A1C < 7        This is a list of the screening recommended for you and due dates:  Health Maintenance  Topic Date Due   Yearly kidney health urinalysis for diabetes  Never done   COVID-19 Vaccine (5 - 2023-24 season) 07/06/2022   Zoster (Shingles) Vaccine (2 of 2) 11/05/2022   Eye exam for diabetics  11/17/2022   Hemoglobin A1C  03/11/2023   Yearly kidney function blood test for diabetes  04/13/2023   Colon Cancer Screening  07/01/2023   Complete foot exam   09/11/2023   Medicare Annual Wellness Visit  10/16/2023   DTaP/Tdap/Td vaccine (3 - Td or Tdap) 07/02/2028   Pneumonia Vaccine  Completed   Flu Shot  Completed   Hepatitis C Screening: USPSTF Recommendation to screen - Ages 82-79 yo.  Completed   HPV Vaccine  Aged Out    Advanced directives: Please bring a copy of your health care power of attorney and living will to the office to be added to your chart at your convenience.   Conditions/risks identified: Aim for 30 minutes of exercise or brisk walking, 6-8 glasses of water, and 5 servings of fruits and vegetables each day.   Next appointment: Follow up in one year for your annual wellness visit.   Preventive Care 63 Years and Older, Male  Preventive care refers to lifestyle choices and visits with your health care provider that can promote health and wellness. What does preventive care include? A yearly physical exam. This is also called an annual well check. Dental exams once or twice a year. Routine eye exams. Ask your health care provider how often you should have your eyes checked. Personal lifestyle choices, including: Daily care of your teeth and gums. Regular physical  activity. Eating a healthy diet. Avoiding tobacco and drug use. Limiting alcohol use. Practicing safe sex. Taking low doses of aspirin every day. Taking vitamin and mineral supplements as recommended by your health care provider. What happens during an annual well check? The services and screenings done by your health care provider during your annual well check will depend on your age, overall health, lifestyle risk factors, and family history of disease. Counseling  Your health care provider may ask you questions about your: Alcohol use. Tobacco use. Drug use. Emotional well-being. Home and relationship well-being. Sexual activity. Eating habits. History of falls. Memory and ability to understand (cognition). Work and work Statistician. Screening  You may have the following tests or measurements: Height, weight, and BMI. Blood pressure. Lipid and cholesterol levels. These may be checked every 5 years, or more frequently if you are over 15 years old. Skin check. Lung cancer screening. You may have this screening every year starting at age 31 if you have a 30-pack-year history of smoking and currently smoke or have quit within the past 15 years. Fecal occult blood test (FOBT) of the stool. You may have this test every year starting at age 44. Flexible sigmoidoscopy or colonoscopy. You may have a sigmoidoscopy every 5 years or a colonoscopy every 10 years starting at age 77. Prostate cancer screening. Recommendations will vary depending on your family  history and other risks. Hepatitis C blood test. Hepatitis B blood test. Sexually transmitted disease (STD) testing. Diabetes screening. This is done by checking your blood sugar (glucose) after you have not eaten for a while (fasting). You may have this done every 1-3 years. Abdominal aortic aneurysm (AAA) screening. You may need this if you are a current or former smoker. Osteoporosis. You may be screened starting at age 7 if you are  at high risk. Talk with your health care provider about your test results, treatment options, and if necessary, the need for more tests. Vaccines  Your health care provider may recommend certain vaccines, such as: Influenza vaccine. This is recommended every year. Tetanus, diphtheria, and acellular pertussis (Tdap, Td) vaccine. You may need a Td booster every 10 years. Zoster vaccine. You may need this after age 53. Pneumococcal 13-valent conjugate (PCV13) vaccine. One dose is recommended after age 31. Pneumococcal polysaccharide (PPSV23) vaccine. One dose is recommended after age 37. Talk to your health care provider about which screenings and vaccines you need and how often you need them. This information is not intended to replace advice given to you by your health care provider. Make sure you discuss any questions you have with your health care provider. Document Released: 11/18/2015 Document Revised: 07/11/2016 Document Reviewed: 08/23/2015 Elsevier Interactive Patient Education  2017 [employer] Prevention in the Home Falls can cause injuries. They can happen to people of all ages. There are many things you can do to make your home safe and to help prevent falls. What can I do on the outside of my home? Regularly fix the edges of walkways and driveways and fix any cracks. Remove anything that might make you trip as you walk through a door, such as a raised step or threshold. Trim any bushes or trees on the path to your home. Use bright outdoor lighting. Clear any walking paths of anything that might make someone trip, such as rocks or tools. Regularly check to see if handrails are loose or broken. Make sure that both sides of any steps have handrails. Any raised decks and porches should have guardrails on the edges. Have any leaves, snow, or ice cleared regularly. Use sand or salt on walking paths during winter. Clean up any spills in your garage right away. This includes oil  or grease spills. What can I do in the bathroom? Use night lights. Install grab bars by the toilet and in the tub and shower. Do not use towel bars as grab bars. Use non-skid mats or decals in the tub or shower. If you need to sit down in the shower, use a plastic, non-slip stool. Keep the floor dry. Clean up any water that spills on the floor as soon as it happens. Remove soap buildup in the tub or shower regularly. Attach bath mats securely with double-sided non-slip rug tape. Do not have throw rugs and other things on the floor that can make you trip. What can I do in the bedroom? Use night lights. Make sure that you have a light by your bed that is easy to reach. Do not use any sheets or blankets that are too big for your bed. They should not hang down onto the floor. Have a firm chair that has side arms. You can use this for support while you get dressed. Do not have throw rugs and other things on the floor that can make you trip. What can I do in the kitchen? Clean up any  spills right away. Avoid walking on wet floors. Keep items that you use a lot in easy-to-reach places. If you need to reach something above you, use a strong step stool that has a grab bar. Keep electrical cords out of the way. Do not use floor polish or wax that makes floors slippery. If you must use wax, use non-skid floor wax. Do not have throw rugs and other things on the floor that can make you trip. What can I do with my stairs? Do not leave any items on the stairs. Make sure that there are handrails on both sides of the stairs and use them. Fix handrails that are broken or loose. Make sure that handrails are as long as the stairways. Check any carpeting to make sure that it is firmly attached to the stairs. Fix any carpet that is loose or worn. Avoid having throw rugs at the top or bottom of the stairs. If you do have throw rugs, attach them to the floor with carpet tape. Make sure that you have a light  switch at the top of the stairs and the bottom of the stairs. If you do not have them, ask someone to add them for you. What else can I do to help prevent falls? Wear shoes that: Do not have high heels. Have rubber bottoms. Are comfortable and fit you well. Are closed at the toe. Do not wear sandals. If you use a stepladder: Make sure that it is fully opened. Do not climb a closed stepladder. Make sure that both sides of the stepladder are locked into place. Ask someone to hold it for you, if possible. Clearly mark and make sure that you can see: Any grab bars or handrails. First and last steps. Where the edge of each step is. Use tools that help you move around (mobility aids) if they are needed. These include: Canes. Walkers. Scooters. Crutches. Turn on the lights when you go into a dark area. Replace any light bulbs as soon as they burn out. Set up your furniture so you have a clear path. Avoid moving your furniture around. If any of your floors are uneven, fix them. If there are any pets around you, be aware of where they are. Review your medicines with your doctor. Some medicines can make you feel dizzy. This can increase your chance of falling. Ask your doctor what other things that you can do to help prevent falls. This information is not intended to replace advice given to you by your health care provider. Make sure you discuss any questions you have with your health care provider. Document Released: 08/18/2009 Document Revised: 03/29/2016 Document Reviewed: 11/26/2014 Elsevier Interactive Patient Education  2017 [employer].

## 2022-12-13 ENCOUNTER — Other Ambulatory Visit: Payer: Self-pay | Admitting: Family Medicine

## 2022-12-13 DIAGNOSIS — E349 Endocrine disorder, unspecified: Secondary | ICD-10-CM

## 2022-12-19 LAB — HM DIABETES EYE EXAM

## 2022-12-24 ENCOUNTER — Encounter: Payer: Self-pay | Admitting: Family Medicine

## 2022-12-24 ENCOUNTER — Ambulatory Visit (INDEPENDENT_AMBULATORY_CARE_PROVIDER_SITE_OTHER): Payer: Medicare Other | Admitting: Family Medicine

## 2022-12-24 ENCOUNTER — Other Ambulatory Visit: Payer: Self-pay

## 2022-12-24 VITALS — BP 127/78 | HR 58 | Temp 97.4°F | Resp 20 | Ht 68.0 in | Wt 170.0 lb

## 2022-12-24 DIAGNOSIS — E349 Endocrine disorder, unspecified: Secondary | ICD-10-CM

## 2022-12-24 DIAGNOSIS — E1159 Type 2 diabetes mellitus with other circulatory complications: Secondary | ICD-10-CM

## 2022-12-24 DIAGNOSIS — E1169 Type 2 diabetes mellitus with other specified complication: Secondary | ICD-10-CM | POA: Diagnosis not present

## 2022-12-24 DIAGNOSIS — E785 Hyperlipidemia, unspecified: Secondary | ICD-10-CM | POA: Diagnosis not present

## 2022-12-24 DIAGNOSIS — I152 Hypertension secondary to endocrine disorders: Secondary | ICD-10-CM

## 2022-12-24 LAB — CBC WITH DIFFERENTIAL/PLATELET
Basophils Absolute: 0 10*3/uL (ref 0.0–0.2)
Basos: 1 %
EOS (ABSOLUTE): 0.1 10*3/uL (ref 0.0–0.4)
Eos: 1 %
Hematocrit: 40.7 % (ref 37.5–51.0)
Hemoglobin: 14.1 g/dL (ref 13.0–17.7)
Immature Grans (Abs): 0 10*3/uL (ref 0.0–0.1)
Immature Granulocytes: 0 %
Lymphocytes Absolute: 2.9 10*3/uL (ref 0.7–3.1)
Lymphs: 46 %
MCH: 31.1 pg (ref 26.6–33.0)
MCHC: 34.6 g/dL (ref 31.5–35.7)
MCV: 90 fL (ref 79–97)
Monocytes Absolute: 0.5 10*3/uL (ref 0.1–0.9)
Monocytes: 9 %
Neutrophils Absolute: 2.7 10*3/uL (ref 1.4–7.0)
Neutrophils: 43 %
Platelets: 233 10*3/uL (ref 150–450)
RBC: 4.54 x10E6/uL (ref 4.14–5.80)
RDW: 12.6 % (ref 11.6–15.4)
WBC: 6.2 10*3/uL (ref 3.4–10.8)

## 2022-12-24 LAB — BAYER DCA HB A1C WAIVED: HB A1C (BAYER DCA - WAIVED): 9.4 % — ABNORMAL HIGH (ref 4.8–5.6)

## 2022-12-24 MED ORDER — OZEMPIC (0.25 OR 0.5 MG/DOSE) 2 MG/3ML ~~LOC~~ SOPN: PEN_INJECTOR | SUBCUTANEOUS | 1 refills | Status: AC

## 2022-12-24 NOTE — Patient Instructions (Signed)
Tips for success with Ozempic (and by success, how not to be super sick on your stomach): Eat small meals AVOID heavy foods (fried/ high in carbs like bread, pasta, rice) AVOID carbonated beverages (soda/ beer, as these can increase bloating) DOUBLE your water intake (will help you avoid constipation/ dehydration)  Ozempic CAN cause: Nausea Abdominal pain Increased acid reflux (sometimes presents as "sour burps") Constipation OR Diarrhea Fatigue (especially when you first start it)

## 2022-12-24 NOTE — Progress Notes (Signed)
Subjective: CC:DM PCP: Janora Norlander, DO QC:[id number] Karau is a 67 y.o. male presenting to clinic today for:  1. Type 2 Diabetes with hypertension, hyperlipidemia:  Metformin increased last visit due to A1c> 7.  He notes that he really has been cracking down on diet and exercise but his blood sugars continuously run 180s to 200s in the morning.  He is compliant with his medication.  Does not report any visual disturbance, chest pain, shortness of breath, polydipsia or polyuria.  Failed Jardiance previously due to recurrent UTI.  Last eye exam: needs Last foot exam: UTD Last A1c:  Lab Results  Component Value Date   HGBA1C 7.2 (H) 09/10/2022   Nephropathy screen indicated?: needs Last flu, zoster and/or pneumovax:  Immunization History  Administered Date(s) Administered   Fluad Quad(high Dose 65+) 08/19/2020, 07/20/2021, 08/01/2022   Influenza Split 10/13/2015   Influenza Whole 08/01/2010   Influenza,inj,Quad PF,6+ Mos 08/01/2016, 08/30/2017, 10/14/2018, 08/11/2019   Moderna Covid-19 Vaccine Bivalent Booster 71yr & up 08/08/2021   Moderna Sars-Covid-2 Vaccination 01/05/2020, 02/02/2020, 02/28/2021   PNEUMOCOCCAL CONJUGATE-20 10/16/2021   Pneumococcal Polysaccharide-23 08/20/2019   Tdap 03/10/2008, 07/02/2018   Zoster Recombinat (Shingrix) 09/10/2022   Zoster, Live 01/11/2017    ROS: Per HPI  Allergies  Allergen Reactions   Jardiance [Empagliflozin]     Recurrent UTI   Penicillins Nausea And Vomiting   Past Medical History:  Diagnosis Date   Diabetes mellitus without complication (HCC)    Elevated blood sugar    Esophageal reflux    Essential hypertension, benign    Hyperplasia of prostate    Internal hemorrhoids    Other and unspecified hyperlipidemia    Other testicular hypofunction    Sleep apnea    CPAP    Tubular adenoma of colon    Unspecified glaucoma(365.9)    Unspecified hemorrhoids without mention of complication     Current Outpatient  Medications:    amLODipine (NORVASC) 5 MG tablet, Take 1 tablet (5 mg total) by mouth daily., Disp: 90 tablet, Rfl: 3   atorvastatin (LIPITOR) 20 MG tablet, Take 1 tablet (20 mg total) by mouth daily. as directed, Disp: 90 tablet, Rfl: 3   bimatoprost (LUMIGAN) 0.01 % SOLN, 1 drop at bedtime., Disp: , Rfl:    Cholecalciferol (VITAMIN D3) 5000 units CAPS, Take 1 capsule by mouth 3 (three) times a week., Disp: , Rfl:    Cinnamon 500 MG TABS, Take 1 tablet by mouth 2 (two) times daily., Disp: , Rfl:    fluticasone (FLONASE) 50 MCG/ACT nasal spray, Place 2 sprays into both nostrils daily., Disp: 48 g, Rfl: 3   glucose blood (FREESTYLE LITE) test strip, TEST BLOOD SUGAR TWICE A DAY R73.9, Disp: 200 strip, Rfl: 3   hydrochlorothiazide (MICROZIDE) 12.5 MG capsule, Take 1 capsule (12.5 mg total) by mouth daily., Disp: 90 capsule, Rfl: 3   icosapent Ethyl (VASCEPA) 1 g capsule, Take 2 capsules (2 g total) by mouth 2 (two) times daily., Disp: 360 capsule, Rfl: 3   metFORMIN (GLUCOPHAGE-XR) 500 MG 24 hr tablet, Take 2 tablets (1,000 mg total) by mouth daily with breakfast., Disp: 180 tablet, Rfl: 3   olmesartan (BENICAR) 40 MG tablet, Take 1 tablet (40 mg total) by mouth daily., Disp: 90 tablet, Rfl: 3   OVER THE COUNTER MEDICATION, Allergy relief otc, nasal spray, fluticasone spray, Disp: , Rfl:    testosterone (ANDROGEL) 50 MG/5GM (1%) GEL, PLACE 5 GRAMS (1 PACKET) ONTO THE SKIN EVERY OTHER DAY, Disp: 450  g, Rfl: 2 Social History   Socioeconomic History   Marital status: Married    Spouse name: Not on file   Number of children: Not on file   Years of education: Not on file   Highest education level: Not on file  Occupational History   Not on file  Tobacco Use   Smoking status: Never   Smokeless tobacco: Never  Vaping Use   Vaping Use: Never used  Substance and Sexual Activity   Alcohol use: Not Currently    Comment: VERY RARE   Drug use: No   Sexual activity: Not Currently  Other Topics  Concern   Not on file  Social History Narrative   Not on file   Social Determinants of Health   Financial Resource Strain: Low Risk  (10/15/2022)   Overall Financial Resource Strain (CARDIA)    Difficulty of Paying Living Expenses: Not hard at all  Food Insecurity: No Food Insecurity (10/15/2022)   Hunger Vital Sign    Worried About Running Out of Food in the Last Year: Never true    [place] in the Last Year: Never true  Transportation Needs: No Transportation Needs (10/15/2022)   PRAPARE - Hydrologist (Medical): No    Lack of Transportation (Non-Medical): No  Physical Activity: Sufficiently Active (10/15/2022)   Exercise Vital Sign    Days of Exercise per Week: 5 days    Minutes of Exercise per Session: 60 min  Stress: No Stress Concern Present (10/15/2022)   [place]    Feeling of Stress : Not at all  Social Connections: [place] (10/15/2022)   Social Connection and Isolation Panel [NHANES]    Frequency of Communication with Friends and Family: More than three times a week    Frequency of Social Gatherings with Friends and Family: Three times a week    Attends Religious Services: More than 4 times per year    Active Member of Clubs or Organizations: Yes    Attends Archivist Meetings: More than 4 times per year    Marital Status: Married  Human resources officer Violence: Not At Risk (10/15/2022)   Humiliation, Afraid, Rape, and Kick questionnaire    Fear of Current or Ex-Partner: No    Emotionally Abused: No    Physically Abused: No    Sexually Abused: No   Family History  Problem Relation Age of Onset   Heart disease Mother    Stroke Father    Colon cancer Paternal Uncle        73's   Esophageal cancer Neg Hx    Rectal cancer Neg Hx    Stomach cancer Neg Hx     Objective: Office vital signs reviewed. BP 127/78   Pulse (!) 58   Temp (!) 97.4  F (36.3 C) (Oral)   Resp 20   Ht 5' 8"$  (1.727 m)   Wt 170 lb (77.1 kg)   SpO2 98%   BMI 25.85 kg/m   Physical Examination:  General: Awake, alert, well nourished, No acute distress HEENT: sclera white, MMM Cardio: regular rate and rhythm, S1S2 heard, no murmurs appreciated Pulm: clear to auscultation bilaterally, no wheezes, rhonchi or rales; normal work of breathing on room air  Assessment/ Plan: 67 y.o. male   Type 2 diabetes mellitus with other specified complication, without long-term current use of insulin ([place]) - Plan: Microalbumin / creatinine urine ratio, Bayer DCA Hb A1c  Waived, Semaglutide,0.25 or 0.5MG/DOS, (OZEMPIC, 0.25 OR 0.5 MG/DOSE,) 2 MG/3ML SOPN, Amb ref to Medical Nutrition Therapy-MNT  Hyperlipidemia associated with type 2 diabetes mellitus ([place]) - Plan: Microalbumin / creatinine urine ratio, Bayer DCA Hb A1c Waived  Hypertension associated with diabetes ([place]) - Plan: Microalbumin / creatinine urine ratio, Bayer DCA Hb A1c Waived  Testosterone deficiency - Plan: CBC with Differential/Platelet  Referral to nutrition placed.  Start Ozempic 0.25 mg subcutaneously each week.  After 1 month may increase to 0.5 mg subcutaneously each week and then he will see me back in 3 months.  Urine microalbumin collected.  ROI for diabetic eye exam from Dr. Bing Plume.  Counseled on possible side effects with Ozempic and how to reduce those side effects.  Handout provided.  Not due for fasting lipid.  Continue current regimen with Lipitor  Blood pressure is controlled.  No changes  Check CBC given history of testosterone deficiency.  Testosterone level not due for 3 months    No orders of the defined types were placed in this encounter.  No orders of the defined types were placed in this encounter.    Janora Norlander, DO [place] [phone]

## 2022-12-25 LAB — MICROALBUMIN / CREATININE URINE RATIO
Creatinine, Urine: 95.8 mg/dL
Microalb/Creat Ratio: 6 mg/g creat (ref 0–29)
Microalbumin, Urine: 5.7 ug/mL

## 2022-12-31 ENCOUNTER — Telehealth: Payer: Self-pay | Admitting: Family Medicine

## 2022-12-31 NOTE — Telephone Encounter (Signed)
Pt says that he needs a PA on Semaglutide,0.25 or 0.'5MG'$ /DOS, (OZEMPIC, 0.25 OR 0.5 MG/DOSE,) 2 MG/3ML SOPN  or call in Trulicity. Trulicity would be the alternative with [facility]. Pt needs 3 mo supply. Please call back

## 2023-01-04 ENCOUNTER — Other Ambulatory Visit (HOSPITAL_COMMUNITY): Payer: Self-pay

## 2023-01-04 NOTE — Telephone Encounter (Signed)
Prior authorization is required for Ozempic (0.25 or 0.5 MG/DOSE) '2MG'$ /3ML pen-injectors. PA submitted and APPROVED on 01/04/23.  Key [id number] Effective: 12/05/22 - 11/04/2098  Per CMM:

## 2023-01-07 NOTE — Telephone Encounter (Signed)
Patient calling to check on status of PA to make sure Dr Lajuana Ripple got it and is aware about the trulicity.

## 2023-01-07 NOTE — Telephone Encounter (Signed)
If her prefers Trulicity, I'm ok with that one.  Looks like the PA has gone through for the [place] though.

## 2023-01-21 ENCOUNTER — Encounter: Payer: Self-pay | Admitting: Family Medicine

## 2023-01-28 ENCOUNTER — Ambulatory Visit: Payer: Self-pay | Admitting: Nutrition

## 2023-02-12 ENCOUNTER — Ambulatory Visit: Payer: Self-pay | Admitting: Nutrition

## 2023-02-13 ENCOUNTER — Other Ambulatory Visit: Payer: Self-pay | Admitting: Family Medicine

## 2023-02-13 DIAGNOSIS — E1169 Type 2 diabetes mellitus with other specified complication: Secondary | ICD-10-CM

## 2023-03-06 ENCOUNTER — Telehealth: Payer: Self-pay | Admitting: Family Medicine

## 2023-03-06 NOTE — Telephone Encounter (Signed)
Called and let pt know , he advised hed like to get his 2nd one at his apt 05/20

## 2023-03-12 ENCOUNTER — Ambulatory Visit: Payer: Self-pay | Admitting: Nutrition

## 2023-03-12 LAB — HM DIABETES EYE EXAM

## 2023-03-19 ENCOUNTER — Encounter: Payer: Medicare Other | Attending: Family Medicine | Admitting: Nutrition

## 2023-03-19 ENCOUNTER — Encounter: Payer: Self-pay | Admitting: Nutrition

## 2023-03-19 VITALS — Ht 68.0 in | Wt 166.0 lb

## 2023-03-19 DIAGNOSIS — E118 Type 2 diabetes mellitus with unspecified complications: Secondary | ICD-10-CM

## 2023-03-19 NOTE — Patient Instructions (Signed)
Goals  Eat more fruits, vegeables and whole grains.-more plant based foods Cut out processed foods. Eat three meals per day Drink water Get A1C down to 6.5% or below.

## 2023-03-19 NOTE — Progress Notes (Signed)
Medical Nutrition Therapy  Appointment Start time:  0830  Appointment End time:  0930  Primary concerns today: DM Type 2  Referral diagnosis: E11.8 Preferred learning style: No preference.  Learning readiness: Ready    NUTRITION ASSESSMENT  67 yr old wmale here for Type 2 DM. Retired from [employer]. Type 2. Minister of a church. Has made dietary and lifestyle changes recently but cutting out sweets. [place], her has been following more of a keto diet instead of a whole plant based diet. This is putting him at high risk for CVD and complications. He is willing to work on lifestyle medicine to improve his DM and overall health. Recently started on Ozempic x 1-2 months.  Is on Metformin twice a day.  Has lost 5 lbs since being on Ozempic. FBS: 110-120's.  He wants to lose his 'belly fat.'  A1C previously was 9.4%. Meter is showing estimated A1C now to be closer to 6.6%.  Lab Results  Component Value Date   HGBA1C 9.4 (H) 12/24/2022      Latest Ref Rng & Units 04/12/2022    8:20 AM 10/16/2021    9:57 AM 03/15/2021   11:06 AM  CMP  Glucose 70 - 99 mg/dL 409  811  914   BUN 8 - 27 mg/dL 18  22  21    Creatinine 0.76 - 1.27 mg/dL 7.82  9.56  2.13   Sodium 134 - 144 mmol/L 137  139  138   Potassium 3.5 - 5.2 mmol/L 4.7  5.1  4.9   Chloride 96 - 106 mmol/L 99  101  97   CO2 20 - 29 mmol/L 22  24  21    Calcium 8.6 - 10.2 mg/dL 08.6  57.8  46.9   Total Protein 6.0 - 8.5 g/dL 7.4  7.5  7.7   Total Bilirubin 0.0 - 1.2 mg/dL 0.6  0.6  0.5   Alkaline Phos 44 - 121 IU/L 59  51  60   AST 0 - 40 IU/L 20  26  29    ALT 0 - 44 IU/L 37  34  42     Anthropometrics  Wt Readings from Last 3 Encounters:  03/19/23 166 lb (75.3 kg)  12/24/22 170 lb (77.1 kg)  10/15/22 171 lb (77.6 kg)   Ht Readings from Last 3 Encounters:  03/19/23 5\' 8"  (1.727 m)  12/24/22 5\' 8"  (1.727 m)  10/15/22 5\' 8"  (1.727 m)   Body mass index is 25.24 kg/m. @BMIFA @ Facility age limit for growth %iles is 20  years. Facility age limit for growth %iles is 20 years.    Clinical Medical Hx: See chart Medications: Ozemipic and metformin  Labs:  Lab Results  Component Value Date   HGBA1C 9.4 (H) 12/24/2022      Latest Ref Rng & Units 04/12/2022    8:20 AM 10/16/2021    9:57 AM 03/15/2021   11:06 AM  CMP  Glucose 70 - 99 mg/dL 629  528  413   BUN 8 - 27 mg/dL 18  22  21    Creatinine 0.76 - 1.27 mg/dL 2.44  0.10  2.72   Sodium 134 - 144 mmol/L 137  139  138   Potassium 3.5 - 5.2 mmol/L 4.7  5.1  4.9   Chloride 96 - 106 mmol/L 99  101  97   CO2 20 - 29 mmol/L 22  24  21    Calcium 8.6 - 10.2 mg/dL 53.6  64.4  03.4   Total  Protein 6.0 - 8.5 g/dL 7.4  7.5  7.7   Total Bilirubin 0.0 - 1.2 mg/dL 0.6  0.6  0.5   Alkaline Phos 44 - 121 IU/L 59  51  60   AST 0 - 40 IU/L 20  26  29    ALT 0 - 44 IU/L 37  34  42    Lipid Panel     Component Value Date/Time   CHOL 151 04/12/2022 0820   CHOL 132 02/19/2013 1005   TRIG 180 (H) 04/12/2022 0820   TRIG 169 (H) 03/07/2016 1552   TRIG 87 02/19/2013 1005   HDL 50 04/12/2022 0820   HDL 52 03/07/2016 1552   HDL 57 02/19/2013 1005   CHOLHDL 3.0 04/12/2022 0820   LDLCALC 71 04/12/2022 0820   LDLCALC 40 07/08/2014 1014   LDLCALC 58 02/19/2013 1005   LABVLDL 30 04/12/2022 0820    Notable Signs/Symptoms: None  Lifestyle & Dietary Hx Married, Education officer, environmental of a church  Estimated daily fluid intake: 60 oz Supplements:  Sleep: varies Stress / self-care: a lot on his job of the church Current average weekly physical activity: walks  24-Hr Dietary Recall First Meal: Eggs, bacon, coffee Snack: meat stick Second Meal: vienna sausage, water, salad Snack: misc Third Meal: salmon, sweet potato fries, broccoli, apple, water Snack: misc Beverages: water  Estimated Energy Needs Calories: 1800 Carbohydrate: 200g Protein: 135g Fat: 50g   NUTRITION DIAGNOSIS  NB-1.1 Food and nutrition-related knowledge deficit As related to Diabetes Type 2.  As evidenced  by A1C 9.4%.Marland Kitchen   NUTRITION INTERVENTION  Nutrition education (E-1) on the following topics:  Nutrition and Diabetes education provided on My Plate, CHO counting, meal planning, portion sizes, timing of meals, avoiding snacks between meals unless having a low blood sugar, target ranges for A1C and blood sugars, signs/symptoms and treatment of hyper/hypoglycemia, monitoring blood sugars, taking medications as prescribed, benefits of exercising 30 minutes per day and prevention of complications of DM. Lifestyle Medicine  - Whole Food, Plant Predominant Nutrition is highly recommended: Eat Plenty of vegetables, Mushrooms, fruits, Legumes, Whole Grains, Nuts, seeds in lieu of processed meats, processed snacks/pastries red meat, poultry, eggs.    -It is better to avoid simple carbohydrates including: Cakes, Sweet Desserts, Ice Cream, Soda (diet and regular), Sweet Tea, Candies, Chips, Cookies, Store Bought Juices, Alcohol in Excess of  1-2 drinks a day, Lemonade,  Artificial Sweeteners, Doughnuts, Coffee Creamers, "Sugar-free" Products, etc, etc.  This is not a complete list.....  Exercise: If you are able: 30 -60 minutes a day ,4 days a week, or 150 minutes a week.  The longer the better.  Combine stretch, strength, and aerobic activities.  If you were told in the past that you have high risk for cardiovascular diseases, you may seek evaluation by your heart doctor prior to initiating moderate to intense exercise programs.   Handouts Provided Include  Lifestyle Medicine Know your numbers   Learning Style & Readiness for Change Teaching method utilized: Visual & Auditory  Demonstrated degree of understanding via: Teach Back  Barriers to learning/adherence to lifestyle change: None  Goals Established by Pt Goals  Eat more fruits, vegeables and whole grains.-more plant based foods Cut out processed foods. Eat three meals per day Drink water Get A1C down to 6.5% or below.   MONITORING &  EVALUATION Dietary intake, weekly physical activity, and blood sugars in 2 months.  Next Steps  Patient is to work on eating more whole plant based foods and meal  planning.Marland Kitchen

## 2023-03-25 ENCOUNTER — Encounter: Payer: Self-pay | Admitting: Family Medicine

## 2023-03-25 ENCOUNTER — Ambulatory Visit (INDEPENDENT_AMBULATORY_CARE_PROVIDER_SITE_OTHER): Payer: Medicare Other | Admitting: Family Medicine

## 2023-03-25 VITALS — BP 110/70 | HR 60 | Temp 97.9°F | Ht 68.0 in | Wt 164.0 lb

## 2023-03-25 DIAGNOSIS — Z7985 Long-term (current) use of injectable non-insulin antidiabetic drugs: Secondary | ICD-10-CM

## 2023-03-25 DIAGNOSIS — D23121 Other benign neoplasm of skin of left upper eyelid, including canthus: Secondary | ICD-10-CM

## 2023-03-25 DIAGNOSIS — E1169 Type 2 diabetes mellitus with other specified complication: Secondary | ICD-10-CM | POA: Diagnosis not present

## 2023-03-25 DIAGNOSIS — E1159 Type 2 diabetes mellitus with other circulatory complications: Secondary | ICD-10-CM

## 2023-03-25 DIAGNOSIS — E349 Endocrine disorder, unspecified: Secondary | ICD-10-CM | POA: Diagnosis not present

## 2023-03-25 DIAGNOSIS — N400 Enlarged prostate without lower urinary tract symptoms: Secondary | ICD-10-CM | POA: Diagnosis not present

## 2023-03-25 DIAGNOSIS — I152 Hypertension secondary to endocrine disorders: Secondary | ICD-10-CM

## 2023-03-25 DIAGNOSIS — J301 Allergic rhinitis due to pollen: Secondary | ICD-10-CM

## 2023-03-25 DIAGNOSIS — E785 Hyperlipidemia, unspecified: Secondary | ICD-10-CM

## 2023-03-25 LAB — BAYER DCA HB A1C WAIVED: HB A1C (BAYER DCA - WAIVED): 6.2 % — ABNORMAL HIGH (ref 4.8–5.6)

## 2023-03-25 MED ORDER — OLMESARTAN MEDOXOMIL 40 MG PO TABS: 40.0000 mg | ORAL_TABLET | Freq: Every day | ORAL | 3 refills | Status: DC

## 2023-03-25 MED ORDER — METFORMIN HCL ER 500 MG PO TB24: 1000.0000 mg | ORAL_TABLET | Freq: Every day | ORAL | 3 refills | Status: DC

## 2023-03-25 MED ORDER — ICOSAPENT ETHYL 1 G PO CAPS: 2.0000 g | ORAL_CAPSULE | Freq: Two times a day (BID) | ORAL | 3 refills | Status: DC

## 2023-03-25 MED ORDER — OZEMPIC (0.25 OR 0.5 MG/DOSE) 2 MG/3ML ~~LOC~~ SOPN
0.5000 mg | PEN_INJECTOR | SUBCUTANEOUS | 3 refills | Status: DC
Start: 2023-03-25 — End: 2024-02-28

## 2023-03-25 MED ORDER — AMLODIPINE BESYLATE 5 MG PO TABS: 5.0000 mg | ORAL_TABLET | Freq: Every day | ORAL | 3 refills | Status: DC

## 2023-03-25 MED ORDER — HYDROCHLOROTHIAZIDE 12.5 MG PO CAPS: 12.5000 mg | ORAL_CAPSULE | Freq: Every day | ORAL | 3 refills | Status: DC

## 2023-03-25 MED ORDER — FLUTICASONE PROPIONATE 50 MCG/ACT NA SUSP
2.0000 | Freq: Every day | NASAL | 3 refills | Status: DC
Start: 2023-03-25 — End: 2024-06-08

## 2023-03-25 MED ORDER — ATORVASTATIN CALCIUM 20 MG PO TABS: 20.0000 mg | ORAL_TABLET | Freq: Every day | ORAL | 3 refills | Status: DC

## 2023-03-25 MED ORDER — TESTOSTERONE 50 MG/5GM (1%) TD GEL
TRANSDERMAL | 2 refills | Status: DC
Start: 2023-03-25 — End: 2023-12-04

## 2023-03-25 NOTE — Progress Notes (Addendum)
Subjective: CC:DM PCP: Terry Ip, DO WGN:[id number] Hartman is a 67 y.o. [name] presenting to clinic today for:  1. Type 2 Diabetes with hypertension, hyperlipidemia:  He reports that he is doing well.  He is lost a little bit of weight with the Ozempic.  He did experience some transient fatigue and constipation but that has since resolved.  His blood sugars are running 114-120s on average with the highest that he seen his 130s and no low blood sugars.  No reports of nausea, vomiting, abdominal pain.  He found the nutritionist visit to be helpful and is really working to make this modifications Last eye exam: UTd Last foot exam: UTD Last A1c:  Lab Results  Component Value Date   HGBA1C 9.4 (H) 12/24/2022   Nephropathy screen indicated?: UTD Last flu, zoster and/or pneumovax:  needs Shingrix but has to be done at his pharmacy Immunization History  Administered Date(s) Administered   Fluad Quad(high Dose 65+) 08/19/2020, 07/20/2021, 08/01/2022   Influenza Split 10/13/2015   Influenza Whole 08/01/2010   Influenza,inj,Quad PF,6+ Mos 08/01/2016, 08/30/2017, 10/14/2018, 08/11/2019   Moderna Covid-19 Vaccine Bivalent Booster 81yrs & up 08/08/2021   Moderna Sars-Covid-2 Vaccination 01/05/2020, 02/02/2020, 02/28/2021   PNEUMOCOCCAL CONJUGATE-20 10/16/2021   Pneumococcal Polysaccharide-23 08/20/2019   Tdap 03/10/2008, 07/02/2018   Zoster Recombinat (Shingrix) 09/10/2022   Zoster, Live 01/11/2017   2.  Testosterone deficiency Continues to use the testosterone topically every other day as directed.  No reports of concerns or complications  3. Skin lesion of left eyelid He reports 2 week history of left upper eyelid lesion.  It is nonpainful.  No bleeding or exudates. Not affecting vision  ROS: Per HPI  Allergies  Allergen Reactions   Jardiance [Empagliflozin]     Recurrent UTI   Penicillins Nausea And Vomiting   Past Medical History:  Diagnosis Date   Diabetes mellitus  without complication (HCC)    Elevated blood sugar    Esophageal reflux    Essential hypertension, benign    Hyperplasia of prostate    Internal hemorrhoids    Other and unspecified hyperlipidemia    Other testicular hypofunction    Sleep apnea    CPAP    Tubular adenoma of colon    Unspecified glaucoma(365.9)    Unspecified hemorrhoids without mention of complication     Current Outpatient Medications:    amLODipine (NORVASC) 5 MG tablet, Take 1 tablet (5 mg total) by mouth daily., Disp: 90 tablet, Rfl: 3   atorvastatin (LIPITOR) 20 MG tablet, Take 1 tablet (20 mg total) by mouth daily. as directed, Disp: 90 tablet, Rfl: 3   bimatoprost (LUMIGAN) 0.01 % SOLN, 1 drop at bedtime., Disp: , Rfl:    Cholecalciferol (VITAMIN D3) 5000 units CAPS, Take 1 capsule by mouth 3 (three) times a week., Disp: , Rfl:    Cinnamon 500 MG TABS, Take 1 tablet by mouth 2 (two) times daily., Disp: , Rfl:    fluticasone (FLONASE) 50 MCG/ACT nasal spray, Place 2 sprays into both nostrils daily., Disp: 48 g, Rfl: 3   glucose blood (FREESTYLE LITE) test strip, USE TO TEST BLOOD SUGAR TWICE A DAY, Disp: 200 strip, Rfl: 3   hydrochlorothiazide (MICROZIDE) 12.5 MG capsule, Take 1 capsule (12.5 mg total) by mouth daily., Disp: 90 capsule, Rfl: 3   icosapent Ethyl (VASCEPA) 1 g capsule, Take 2 capsules (2 g total) by mouth 2 (two) times daily., Disp: 360 capsule, Rfl: 3   metFORMIN (GLUCOPHAGE-XR) 500 MG 24  hr tablet, Take 2 tablets (1,000 mg total) by mouth daily with breakfast., Disp: 180 tablet, Rfl: 3   olmesartan (BENICAR) Terry MG tablet, Take 1 tablet (Terry mg total) by mouth daily., Disp: 90 tablet, Rfl: 3   OVER THE COUNTER MEDICATION, Allergy relief otc, nasal spray, fluticasone spray, Disp: , Rfl:    testosterone (ANDROGEL) 50 MG/5GM (1%) GEL, PLACE 5 GRAMS (1 PACKET) ONTO THE SKIN EVERY OTHER DAY, Disp: 450 g, Rfl: 2 Social History   Socioeconomic History   Marital status: Married    Spouse name: Not on  file   Number of children: Not on file   Years of education: Not on file   Highest education level: Not on file  Occupational History   Not on file  Tobacco Use   Smoking status: Never   Smokeless tobacco: Never  Vaping Use   Vaping Use: Never used  Substance and Sexual Activity   Alcohol use: Not Currently    Comment: VERY RARE   Drug use: No   Sexual activity: Not Currently  Other Topics Concern   Not on file  Social History Narrative   Not on file   Social Determinants of Health   Financial Resource Strain: Low Risk  (10/15/2022)   Overall Financial Resource Strain (CARDIA)    Difficulty of Paying Living Expenses: Not hard at all  Food Insecurity: No Food Insecurity (10/15/2022)   Hunger Vital Sign    Worried About Running Out of Food in the Last Year: Never true    Ran Out of Food in the Last Year: Never true  Transportation Needs: No Transportation Needs (10/15/2022)   PRAPARE - Administrator, Civil Service (Medical): No    Lack of Transportation (Non-Medical): No  Physical Activity: Sufficiently Active (10/15/2022)   Exercise Vital Sign    Days of Exercise per Week: 5 days    Minutes of Exercise per Session: 60 min  Stress: No Stress Concern Present (10/15/2022)   [employer] of Occupational Health - Occupational Stress Questionnaire    Feeling of Stress : Not at all  Social Connections: Socially Integrated (10/15/2022)   Social Connection and Isolation Panel [NHANES]    Frequency of Communication with Friends and Family: More than three times a week    Frequency of Social Gatherings with Friends and Family: Three times a week    Attends Religious Services: More than 4 times per year    Active Member of Clubs or Organizations: Yes    Attends Banker Meetings: More than 4 times per year    Marital Status: Married  Catering manager Violence: Not At Risk (10/15/2022)   Humiliation, Afraid, Rape, and Kick questionnaire    Fear of  Current or Ex-Partner: No    Emotionally Abused: No    Physically Abused: No    Sexually Abused: No   Family History  Problem Relation Age of Onset   Heart disease Mother    Stroke Father    Colon cancer Paternal Uncle        54's   Esophageal cancer Neg Hx    Rectal cancer Neg Hx    Stomach cancer Neg Hx     Objective: Office vital signs reviewed. BP 110/70   Pulse 60   Temp 97.9 F (36.6 C)   Ht 5\' 8"  (1.727 m)   Wt 164 lb (74.4 kg)   SpO2 97%   BMI 24.94 kg/m   Physical Examination:  General: Awake, alert,  well nourished, No acute distress HEENT: sclera white, MMM.  Flesh-colored small skin lesion on the left upper eyelid that appears to be a skin tag Cardio: regular rate and rhythm, S1S2 heard, no murmurs appreciated Pulm: clear to auscultation bilaterally, no wheezes, rhonchi or rales; normal work of breathing on room air GI: Soft, nontender, nondistended  Assessment/ Plan: 67 y.o. [name]   Type 2 diabetes mellitus with other specified complication, without long-term current use of insulin (HCC) - Plan: Bayer DCA Hb A1c Waived, CMP14+EGFR, Semaglutide,0.25 or 0.5MG /DOS, (OZEMPIC, 0.25 OR 0.5 MG/DOSE,) 2 MG/3ML SOPN, metFORMIN (GLUCOPHAGE-XR) 500 MG 24 hr tablet  Hyperlipidemia associated with type 2 diabetes mellitus (HCC) - Plan: Lipid Panel, TSH, CMP14+EGFR, atorvastatin (LIPITOR) 20 MG tablet, icosapent Ethyl (VASCEPA) 1 g capsule  Hypertension associated with diabetes (HCC) - Plan: CMP14+EGFR, amLODipine (NORVASC) 5 MG tablet, hydrochlorothiazide (MICROZIDE) 12.5 MG capsule, olmesartan (BENICAR) Terry MG tablet  Testosterone deficiency - Plan: CBC with Differential, PSA, CMP14+EGFR, Testosterone, ToxASSURE Select 13 (MW), Urine, testosterone (ANDROGEL) 50 MG/5GM (1%) GEL  Benign prostatic hyperplasia, unspecified whether lower urinary tract symptoms present - Plan: PSA  Seasonal allergic rhinitis due to pollen - Plan: fluticasone (FLONASE) 50 MCG/ACT nasal  spray  Benign neoplasm of skin of left upper eyelid  Blood sugar now controlled.  Continue current regimen.  Refills have been sent.  Check renal function, liver enzymes and fasting lipid.  He will continue statin  Blood pressure well-controlled.  Continue current regimen.  Hopefully we will be able to start reading him of some of these blood pressure medications as he continues to have improved health with the GLP  Testosterone level collected.  He seems to be responding well to current treatment plan.  UDS and CSC were updated as per office policy on his controlled substance and the national narcotic database was reviewed with no red flags  Check PSA given use of exogenous testosterone and history of BPH  Flonase renewed for ongoing management of allergic rhinitis  Lesion on left upper eyelid appears to be a skin tag.  Does not seem consistent with a chalazion as it does not appear to be affecting the mucosal surface of the eyelid.  Differential diagnosis includes xanthelasma but it does not have that typical yellow look to it.  We discussed that if it gets larger or starts having any other symptoms, low threshold to have him see dermatology to consider resection  Orders Placed This Encounter  Procedures   CBC with Differential   Bayer DCA Hb A1c Waived   Lipid Panel   TSH   PSA   CMP14+EGFR   Testosterone   ToxASSURE Select 13 (MW), Urine    Current Outpatient Medications:    amLODipine (NORVASC) 5 MG tablet, Take 1 tablet (5 mg total) by mouth daily., Disp: 90 tablet, Rfl: 3   atorvastatin (LIPITOR) 20 MG tablet, Take 1 tablet (20 mg total) by mouth daily. as directed, Disp: 90 tablet, Rfl: 3   bimatoprost (LUMIGAN) 0.01 % SOLN, 1 drop at bedtime., Disp: , Rfl:    Cholecalciferol (VITAMIN D3) 5000 units CAPS, Take 1 capsule by mouth 3 (three) times a week., Disp: , Rfl:    Cinnamon 500 MG TABS, Take 1 tablet by mouth 2 (two) times daily., Disp: , Rfl:    fluticasone  (FLONASE) 50 MCG/ACT nasal spray, Place 2 sprays into both nostrils daily., Disp: 48 g, Rfl: 3   glucose blood (FREESTYLE LITE) test strip, USE TO TEST BLOOD SUGAR TWICE A  DAY, Disp: 200 strip, Rfl: 3   hydrochlorothiazide (MICROZIDE) 12.5 MG capsule, Take 1 capsule (12.5 mg total) by mouth daily., Disp: 90 capsule, Rfl: 3   icosapent Ethyl (VASCEPA) 1 g capsule, Take 2 capsules (2 g total) by mouth 2 (two) times daily., Disp: 360 capsule, Rfl: 3   metFORMIN (GLUCOPHAGE-XR) 500 MG 24 hr tablet, Take 2 tablets (1,000 mg total) by mouth daily with breakfast., Disp: 180 tablet, Rfl: 3   olmesartan (BENICAR) Terry MG tablet, Take 1 tablet (Terry mg total) by mouth daily., Disp: 90 tablet, Rfl: 3   OVER THE COUNTER MEDICATION, Allergy relief otc, nasal spray, fluticasone spray, Disp: , Rfl:    testosterone (ANDROGEL) 50 MG/5GM (1%) GEL, PLACE 5 GRAMS (1 PACKET) ONTO THE SKIN EVERY OTHER DAY, Disp: 450 g, Rfl: 2   No orders of the defined types were placed in this encounter.    Terry Ip, DO [facility] [phone]

## 2023-03-26 ENCOUNTER — Other Ambulatory Visit: Payer: Self-pay

## 2023-03-26 DIAGNOSIS — E349 Endocrine disorder, unspecified: Secondary | ICD-10-CM

## 2023-03-26 DIAGNOSIS — E1169 Type 2 diabetes mellitus with other specified complication: Secondary | ICD-10-CM

## 2023-03-26 LAB — CBC WITH DIFFERENTIAL/PLATELET
Basophils Absolute: 0 10*3/uL (ref 0.0–0.2)
Basos: 0 %
EOS (ABSOLUTE): 0.1 10*3/uL (ref 0.0–0.4)
Eos: 1 %
Hematocrit: 42.7 % (ref 37.5–51.0)
Hemoglobin: 14.4 g/dL (ref 13.0–17.7)
Immature Grans (Abs): 0 10*3/uL (ref 0.0–0.1)
Immature Granulocytes: 0 %
Lymphocytes Absolute: 3 10*3/uL (ref 0.7–3.1)
Lymphs: 43 %
MCH: 31 pg (ref 26.6–33.0)
MCHC: 33.7 g/dL (ref 31.5–35.7)
MCV: 92 fL (ref 79–97)
Monocytes Absolute: 0.5 10*3/uL (ref 0.1–0.9)
Monocytes: 7 %
Neutrophils Absolute: 3.5 10*3/uL (ref 1.4–7.0)
Neutrophils: 49 %
Platelets: 262 10*3/uL (ref 150–450)
RBC: 4.65 x10E6/uL (ref 4.14–5.80)
RDW: 13.1 % (ref 11.6–15.4)
WBC: 7.1 10*3/uL (ref 3.4–10.8)

## 2023-03-26 LAB — CMP14+EGFR
ALT: 63 IU/L — ABNORMAL HIGH (ref 0–44)
AST: 37 IU/L (ref 0–40)
Albumin/Globulin Ratio: 1.9 (ref 1.2–2.2)
Albumin: 5 g/dL — ABNORMAL HIGH (ref 3.9–4.9)
Alkaline Phosphatase: 54 IU/L (ref 44–121)
BUN/Creatinine Ratio: 18 (ref 10–24)
BUN: 16 mg/dL (ref 8–27)
Bilirubin Total: 0.7 mg/dL (ref 0.0–1.2)
CO2: 23 mmol/L (ref 20–29)
Calcium: 10.2 mg/dL (ref 8.6–10.2)
Chloride: 100 mmol/L (ref 96–106)
Creatinine, Ser: 0.88 mg/dL (ref 0.76–1.27)
Globulin, Total: 2.6 g/dL (ref 1.5–4.5)
Glucose: 123 mg/dL — ABNORMAL HIGH (ref 70–99)
Potassium: 5 mmol/L (ref 3.5–5.2)
Sodium: 137 mmol/L (ref 134–144)
Total Protein: 7.6 g/dL (ref 6.0–8.5)
eGFR: 95 mL/min/{1.73_m2} (ref >59)

## 2023-03-26 LAB — LIPID PANEL
Chol/HDL Ratio: 2.4 ratio (ref 0.0–5.0)
Cholesterol, Total: 130 mg/dL (ref 100–199)
HDL: 54 mg/dL (ref >39)
LDL Chol Calc (NIH): 56 mg/dL (ref 0–99)
Triglycerides: 113 mg/dL (ref 0–149)
VLDL Cholesterol Cal: 20 mg/dL (ref 5–40)

## 2023-03-26 LAB — TSH: TSH: 1.87 u[IU]/mL (ref 0.450–4.500)

## 2023-03-26 LAB — TESTOSTERONE: Testosterone: 503 ng/dL (ref 264–916)

## 2023-03-26 LAB — PSA: Prostate Specific Ag, Serum: 3.1 ng/mL (ref 0.0–4.0)

## 2023-03-27 ENCOUNTER — Encounter: Payer: Self-pay | Admitting: Internal Medicine

## 2023-03-28 LAB — TOXASSURE SELECT 13 (MW), URINE

## 2023-04-04 ENCOUNTER — Encounter: Payer: Self-pay | Admitting: Internal Medicine

## 2023-05-13 ENCOUNTER — Encounter: Payer: Self-pay | Admitting: Nutrition

## 2023-05-13 ENCOUNTER — Encounter: Payer: Medicare Other | Attending: Family Medicine | Admitting: Nutrition

## 2023-05-13 VITALS — Ht 68.0 in | Wt 167.0 lb

## 2023-05-13 DIAGNOSIS — E118 Type 2 diabetes mellitus with unspecified complications: Secondary | ICD-10-CM | POA: Insufficient documentation

## 2023-05-13 NOTE — Patient Instructions (Addendum)
Keep up the great job! Eat 45-60 g carbs at meals. Keep focus on whole plant based foods. You're doing excellent! Talk to PCP at next visit about stopping Metformin if BS stay well controlled. You don't need to lose anymore weight.

## 2023-05-13 NOTE — Progress Notes (Signed)
Medical Nutrition Therapy  Appointment Start time:  0830  Appointment End time:  0900  Primary concerns today: DM Type 2  Referral diagnosis: E11.8 Preferred learning style: No preference.  Learning readiness: Ready    NUTRITION ASSESSMENT  67 yr old wmale here for Type 2 DM.  Downloaded fullplateliving and 7th module. Eating less red meat and more veggies, fruit, fish and chicken. He has been eating better balanced meals and adding fruits and other healthy carb choices that he was restricting. Has more energy and sleeping better. Feels much better. His BS are improving-100-150's. A1C down from 9.4% to 6.2% in just 3 months. He is on Ozempic weekly and Metformin twice a day. Wt is stable. BMI 25. Denies any low blood sugars.  Lab Results  Component Value Date   HGBA1C 6.2 (H) 03/25/2023      Latest Ref Rng & Units 03/25/2023    9:13 AM 04/12/2022    8:20 AM 10/16/2021    9:57 AM  CMP  Glucose 70 - 99 mg/dL 161  096  045   BUN 8 - 27 mg/dL 16  18  22    Creatinine 0.76 - 1.27 mg/dL 4.09  8.11  9.14   Sodium 134 - 144 mmol/L 137  137  139   Potassium 3.5 - 5.2 mmol/L 5.0  4.7  5.1   Chloride 96 - 106 mmol/L 100  99  101   CO2 20 - 29 mmol/L 23  22  24    Calcium 8.6 - 10.2 mg/dL 78.2  95.6  21.3   Total Protein 6.0 - 8.5 g/dL 7.6  7.4  7.5   Total Bilirubin 0.0 - 1.2 mg/dL 0.7  0.6  0.6   Alkaline Phos 44 - 121 IU/L 54  59  51   AST 0 - 40 IU/L 37  20  26   ALT 0 - 44 IU/L 63  37  34     Anthropometrics  Wt Readings from Last 3 Encounters:  05/13/23 167 lb (75.8 kg)  03/25/23 164 lb (74.4 kg)  03/19/23 166 lb (75.3 kg)   Ht Readings from Last 3 Encounters:  05/13/23 5\' 8"  (1.727 m)  03/25/23 5\' 8"  (1.727 m)  03/19/23 5\' 8"  (1.727 m)   Body mass index is 25.39 kg/m. @BMIFA @ Facility age limit for growth %iles is 20 years. Facility age limit for growth %iles is 20 years.   There is no height or weight on file to calculate BMI. @BMIFA @ Facility age limit for  growth %iles is 20 years. Facility age limit for growth %iles is 20 years.    Clinical Medical Hx: See chart Medications: Ozemipic and metformin  Labs:  Lab Results  Component Value Date   HGBA1C 6.2 (H) 03/25/2023      Latest Ref Rng & Units 03/25/2023    9:13 AM 04/12/2022    8:20 AM 10/16/2021    9:57 AM  CMP  Glucose 70 - 99 mg/dL 086  578  469   BUN 8 - 27 mg/dL 16  18  22    Creatinine 0.76 - 1.27 mg/dL 6.29  5.28  4.13   Sodium 134 - 144 mmol/L 137  137  139   Potassium 3.5 - 5.2 mmol/L 5.0  4.7  5.1   Chloride 96 - 106 mmol/L 100  99  101   CO2 20 - 29 mmol/L 23  22  24    Calcium 8.6 - 10.2 mg/dL 24.4  01.0  27.2  Total Protein 6.0 - 8.5 g/dL 7.6  7.4  7.5   Total Bilirubin 0.0 - 1.2 mg/dL 0.7  0.6  0.6   Alkaline Phos 44 - 121 IU/L 54  59  51   AST 0 - 40 IU/L 37  20  26   ALT 0 - 44 IU/L 63  37  34    Lipid Panel     Component Value Date/Time   CHOL 130 03/25/2023 0913   CHOL 132 02/19/2013 1005   TRIG 113 03/25/2023 0913   TRIG 169 (H) 03/07/2016 1552   TRIG 87 02/19/2013 1005   HDL 54 03/25/2023 0913   HDL 52 03/07/2016 1552   HDL 57 02/19/2013 1005   CHOLHDL 2.4 03/25/2023 0913   LDLCALC 56 03/25/2023 0913   LDLCALC 40 07/08/2014 1014   LDLCALC 58 02/19/2013 1005   LABVLDL 20 03/25/2023 0913    Notable Signs/Symptoms: None  Lifestyle & Dietary Hx Married, Renato Gails of a church  Estimated daily fluid intake: 60 oz Supplements:  Sleep: varies 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  As  evidenced by A1C 9.4%.Marland Kitchen   NUTRITION INTERVENTION  Nutrition education (E-1) on the following topics:  Nutrition and Diabetes education provided on My Plate, CHO counting, meal planning, portion sizes, timing of meals, avoiding snacks between meals unless having a low blood sugar, target ranges for A1C and blood sugars, signs/symptoms and treatment of hyper/hypoglycemia, monitoring blood sugars, taking medications as prescribed, benefits of exercising 30 minutes per day and prevention of complications of DM. Lifestyle Medicine  - Whole Food, Plant Predominant Nutrition is highly recommended: Eat Plenty of vegetables, Mushrooms, fruits, Legumes, Whole Grains, Nuts, seeds in lieu of processed meats, processed snacks/pastries red meat, poultry, eggs.    -It is better to avoid simple carbohydrates including: Cakes, Sweet Desserts, Ice Cream, Soda (diet and regular), Sweet Tea, Candies, Chips, Cookies, Store Bought Juices, Alcohol in Excess of  1-2 drinks a day, Lemonade,  Artificial Sweeteners, Doughnuts, Coffee Creamers, "Sugar-free" Products, etc, etc.  This is not a complete list.....  Exercise: If you are able: 30 -60 minutes a day ,4 days a week, or 150 minutes a week.  The longer the better.  Combine stretch, strength, and aerobic activities.  If you were told in the past that you have high risk for cardiovascular diseases, you may seek evaluation by your heart doctor prior to initiating moderate to intense exercise programs.   Handouts Provided Include  Lifestyle Medicine Know your numbers   Learning Style & Readiness for Change Teaching method utilized: Visual & Auditory  Demonstrated degree of understanding via: Teach Back  Barriers to learning/adherence to lifestyle change: None  Goals Established by Pt Goals Keep up the great job! Eat 45-60 g carbs at meals. Keep focus on whole plant based foods. You're doing excellent! Talk to PCP at next visit about stopping Metformin if BS stay  well controlled. You don't need to lose anymore weight.    MONITORING & EVALUATION Dietary intake, weekly physical activity, and blood sugars in 6 months. Recommend to  consider stopping Metformin since he is on Ozempic. Next Steps  Patient is to work on eating more whole plant based foods and meal planning.Marland Kitchen

## 2023-06-27 ENCOUNTER — Encounter: Payer: Medicare Other | Admitting: Internal Medicine

## 2023-08-05 ENCOUNTER — Ambulatory Visit (AMBULATORY_SURGERY_CENTER): Payer: Medicare Other

## 2023-08-05 VITALS — Ht 68.0 in | Wt 162.0 lb

## 2023-08-05 DIAGNOSIS — Z860100 Personal history of colon polyps, unspecified: Secondary | ICD-10-CM

## 2023-08-05 MED ORDER — NA SULFATE-K SULFATE-MG SULF 17.5-3.13-1.6 GM/177ML PO SOLN: 1.0000 | Freq: Once | ORAL | 0 refills | Status: AC

## 2023-08-05 NOTE — Progress Notes (Signed)
Pre visit completed via phone call; Patient verified name, DOB, and address; No egg or soy allergy known to patient;  No issues known to pt with past sedation with any surgeries or procedures; Patient denies ever being told they had issues or difficulty with intubation;  No FH of Malignant Hyperthermia; Pt is not on diet pills; Pt is not on home 02;  Pt is not on blood thinners;  Pt reports issues with constipation - sometimes takes stool softener- advised patient to increase fluids, activity, fruits/veggies that are allowed prior to prep; can also take gentle laxative if needed; No A fib or A flutter; Have any cardiac testing pending--NO Insurance verified during PV appt--- Medicare A/B and Tricare for Life Pt can ambulate without assistance;  Pt denies use of chewing tobacco; Discussed diabetic/weight loss medication holds; Discussed NSAID holds; Checked BMI to be less than 50; Pt instructed to use Singlecare.com or GoodRx for a price reduction on prep;  Patient's chart reviewed by Cathlyn Parsons CNRA prior to previsit and patient appropriate for the [facility];  Pre visit completed and red dot placed by patient's name on their procedure day (on provider's schedule);   Instructions sent to MyChart per patient request;

## 2023-08-14 ENCOUNTER — Other Ambulatory Visit: Payer: Self-pay | Admitting: Family Medicine

## 2023-08-14 DIAGNOSIS — E1169 Type 2 diabetes mellitus with other specified complication: Secondary | ICD-10-CM

## 2023-08-26 ENCOUNTER — Encounter: Payer: Self-pay | Admitting: Internal Medicine

## 2023-08-26 ENCOUNTER — Ambulatory Visit: Payer: Medicare Other | Admitting: Internal Medicine

## 2023-08-26 VITALS — BP 105/71 | HR 90 | Temp 97.1°F | Resp 12 | Ht 68.0 in | Wt 162.0 lb

## 2023-08-26 DIAGNOSIS — Z860100 Personal history of colon polyps, unspecified: Secondary | ICD-10-CM | POA: Diagnosis not present

## 2023-08-26 DIAGNOSIS — Z09 Encounter for follow-up examination after completed treatment for conditions other than malignant neoplasm: Secondary | ICD-10-CM

## 2023-08-26 DIAGNOSIS — D124 Benign neoplasm of descending colon: Secondary | ICD-10-CM | POA: Diagnosis not present

## 2023-08-26 MED ORDER — SODIUM CHLORIDE 0.9 % IV SOLN: 500.0000 mL | Freq: Once | INTRAVENOUS | Status: DC

## 2023-08-26 NOTE — Progress Notes (Signed)
GASTROENTEROLOGY PROCEDURE H&P NOTE   Primary Care Physician: Raliegh Ip, DO    Reason for Procedure:  History of colon polyps  Plan:    Colonoscopy  Patient is appropriate for endoscopic procedure(s) in the ambulatory ([facility]) setting.  The nature of the procedure, as well as the risks, benefits, and alternatives were carefully and thoroughly reviewed with the patient. Ample time for discussion and questions allowed. The patient understood, was satisfied, and agreed to proceed.     HPI: Terry [name] is a 67 y.o. male who presents for surveillance colonoscopy.  Medical history as below.  Tolerated the prep.  No recent chest pain or shortness of breath.  No abdominal pain today.  Past Medical History:  Diagnosis Date   Diabetes mellitus without complication (HCC)    on meds   Elevated blood sugar    Esophageal reflux    hx of   Essential hypertension, benign    on meds   Glaucoma    on meds (drops)   Hyperplasia of prostate    Internal hemorrhoids    Other and unspecified hyperlipidemia    on meds   Other testicular hypofunction    Sleep apnea    no longer uses CPAP- weight lost   Tubular adenoma of colon    Unspecified glaucoma(365.9)    Unspecified hemorrhoids without mention of complication     Past Surgical History:  Procedure Laterality Date   COLONOSCOPY  2019   JMP-MAC-suprep(good)-tics/int hems/polyps   DENTAL SURGERY  1972   LIPOMA EXCISION Right 2000   shoulder    Prior to Admission medications   Medication Sig Start Date End Date Taking? Authorizing Provider  amLODipine (NORVASC) 5 MG tablet Take 1 tablet (5 mg total) by mouth daily. 03/25/23  Yes Gottschalk, Kathie Rhodes M, DO  atorvastatin (LIPITOR) 20 MG tablet Take 1 tablet (20 mg total) by mouth daily. as directed 03/25/23  Yes Gottschalk, Ashly M, DO  bimatoprost (LUMIGAN) 0.01 % SOLN 1 drop at bedtime.   Yes [provider]  Cholecalciferol (VITAMIN D3) 5000 units CAPS Take 1  capsule by mouth 3 (three) times a week.   Yes [provider]  Cinnamon 500 MG TABS Take 1 tablet by mouth 2 (two) times daily.   Yes [provider]  dorzolamide-timolol (COSOPT) 2-0.5 % ophthalmic solution Place 1 drop into both eyes 2 (two) times daily. 08/24/22  Yes [provider]  fluticasone (FLONASE) 50 MCG/ACT nasal spray Place 2 sprays into both nostrils daily. 03/25/23  Yes Gottschalk, Kathie Rhodes M, DO  glucose blood (FREESTYLE LITE) test strip USE TO TEST BLOOD SUGAR TWICE A DAY 02/13/23  Yes Gottschalk, Ashly M, DO  hydrochlorothiazide (MICROZIDE) 12.5 MG capsule Take 1 capsule (12.5 mg total) by mouth daily. 03/25/23  Yes Gottschalk, Kathie Rhodes M, DO  icosapent Ethyl (VASCEPA) 1 g capsule Take 2 capsules (2 g total) by mouth 2 (two) times daily. 03/25/23  Yes Gottschalk, Kathie Rhodes M, DO  metFORMIN (GLUCOPHAGE-XR) 500 MG 24 hr tablet TAKE 2 TABLETS DAILY WITH BREAKFAST 08/14/23  Yes Gottschalk, Ashly M, DO  olmesartan (BENICAR) 40 MG tablet Take 1 tablet (40 mg total) by mouth daily. 03/25/23  Yes Gottschalk, Ashly M, DO  testosterone (ANDROGEL) 50 MG/5GM (1%) GEL PLACE 5 GRAMS (1 PACKET) ONTO THE SKIN EVERY OTHER DAY 03/25/23  Yes Gottschalk, Ashly M, DO  Semaglutide,0.25 or 0.5MG /DOS, (OZEMPIC, 0.25 OR 0.5 MG/DOSE,) 2 MG/3ML SOPN Inject 0.5 mg into the skin every 7 (seven) days. 03/25/23  Raliegh Ip, DO    Current Outpatient Medications  Medication Sig Dispense Refill   amLODipine (NORVASC) 5 MG tablet Take 1 tablet (5 mg total) by mouth daily. 90 tablet 3   atorvastatin (LIPITOR) 20 MG tablet Take 1 tablet (20 mg total) by mouth daily. as directed 90 tablet 3   bimatoprost (LUMIGAN) 0.01 % SOLN 1 drop at bedtime.     Cholecalciferol (VITAMIN D3) 5000 units CAPS Take 1 capsule by mouth 3 (three) times a week.     Cinnamon 500 MG TABS Take 1 tablet by mouth 2 (two) times daily.     dorzolamide-timolol (COSOPT) 2-0.5 % ophthalmic solution Place 1 drop into both eyes 2  (two) times daily.     fluticasone (FLONASE) 50 MCG/ACT nasal spray Place 2 sprays into both nostrils daily. 48 g 3   glucose blood (FREESTYLE LITE) test strip USE TO TEST BLOOD SUGAR TWICE A DAY 200 strip 3   hydrochlorothiazide (MICROZIDE) 12.5 MG capsule Take 1 capsule (12.5 mg total) by mouth daily. 90 capsule 3   icosapent Ethyl (VASCEPA) 1 g capsule Take 2 capsules (2 g total) by mouth 2 (two) times daily. 360 capsule 3   metFORMIN (GLUCOPHAGE-XR) 500 MG 24 hr tablet TAKE 2 TABLETS DAILY WITH BREAKFAST 180 tablet 0   olmesartan (BENICAR) 40 MG tablet Take 1 tablet (40 mg total) by mouth daily. 90 tablet 3   testosterone (ANDROGEL) 50 MG/5GM (1%) GEL PLACE 5 GRAMS (1 PACKET) ONTO THE SKIN EVERY OTHER DAY 450 g 2   Semaglutide,0.25 or 0.5MG /DOS, (OZEMPIC, 0.25 OR 0.5 MG/DOSE,) 2 MG/3ML SOPN Inject 0.5 mg into the skin every 7 (seven) days. 9 mL 3   Current Facility-Administered Medications  Medication Dose Route Frequency Provider Last Rate Last Admin   0.9 %  sodium chloride infusion  500 mL Intravenous Once Daruis Swaim, Carie Caddy, MD        Allergies as of 08/26/2023 - Review Complete 08/26/2023  Allergen Reaction Noted   Jardiance [empagliflozin]  06/19/2022   Penicillins Nausea And Vomiting 01/23/2011    Family History  Problem Relation Age of Onset   Heart disease Mother    Stroke Father    Colon cancer Paternal Uncle        67's   Esophageal cancer Neg Hx    Rectal cancer Neg Hx    Stomach cancer Neg Hx     Social History   Socioeconomic History   Marital status: Married    Spouse name: Not on file   Number of children: Not on file   Years of education: Not on file   Highest education level: Not on file  Occupational History   Not on file  Tobacco Use   Smoking status: Never   Smokeless tobacco: Never  Vaping Use   Vaping status: Never Used  Substance and Sexual Activity   Alcohol use: Not Currently    Comment: VERY RARE   Drug use: No   Sexual activity: Not  Currently  Other Topics Concern   Not on file  Social History Narrative   Not on file   Social Determinants of Health   Financial Resource Strain: Low Risk  (10/15/2022)   Overall Financial Resource Strain (CARDIA)    Difficulty of Paying Living Expenses: Not hard at all  Food Insecurity: No Food Insecurity (10/15/2022)   Hunger Vital Sign    Worried About Running Out of Food in the Last Year: Never true    Ran Out  of Food in the Last Year: Never true  Transportation Needs: No Transportation Needs (10/15/2022)   PRAPARE - Administrator, Civil Service (Medical): No    Lack of Transportation (Non-Medical): No  Physical Activity: Sufficiently Active (10/15/2022)   Exercise Vital Sign    Days of Exercise per Week: 5 days    Minutes of Exercise per Session: 60 min  Stress: No Stress Concern Present (10/15/2022)   [employer] of Occupational Health - Occupational Stress Questionnaire    Feeling of Stress : Not at all  Social Connections: Socially Integrated (10/15/2022)   Social Connection and Isolation Panel [NHANES]    Frequency of Communication with Friends and Family: More than three times a week    Frequency of Social Gatherings with Friends and Family: Three times a week    Attends Religious Services: More than 4 times per year    Active Member of Clubs or Organizations: Yes    Attends Banker Meetings: More than 4 times per year    Marital Status: Married  Catering manager Violence: Not At Risk (10/15/2022)   Humiliation, Afraid, Rape, and Kick questionnaire    Fear of Current or Ex-Partner: No    Emotionally Abused: No    Physically Abused: No    Sexually Abused: No    Physical Exam: Vital signs in last 24 hours: @BP  119/71   Pulse 83   Temp (!) 97.1 F (36.2 C) (Skin)   Ht 5\' 8"  (1.727 m)   Wt 162 lb (73.5 kg)   SpO2 97%   BMI 24.63 kg/m  GEN: NAD EYE: Sclerae anicteric ENT: MMM CV: Non-tachycardic Pulm: CTA b/l GI: Soft,  NT/ND NEURO:  Alert & Oriented x 3   Erick Blinks, MD [facility]  08/26/2023 11:47 AM

## 2023-08-26 NOTE — Progress Notes (Signed)
Sedate, gd SR, tolerated procedure well, VSS, report to RN 

## 2023-08-26 NOTE — Progress Notes (Signed)
Called to room to assist during endoscopic procedure.  Patient ID and intended procedure confirmed with present staff. Received instructions for my participation in the procedure from the performing physician.  

## 2023-08-26 NOTE — Op Note (Signed)
[facility] Patient Name: Terry [name] Procedure Date: 08/26/2023 11:46 AM MRN: [id number] Endoscopist: Beverley Fiedler , MD, [id number] Age: 67 Referring MD:  Date of Birth: Apr 25, 1956 Gender: Male Account #: [id number] Procedure:                Colonoscopy Indications:              High risk colon cancer surveillance: Personal                            history of sessile serrated colon polyp (less than                            10 mm in size) with no dysplasia, Last colonoscopy:                            August 2019 (SSP x 1, TA x 1) Medicines:                Monitored Anesthesia Care Procedure:                Pre-Anesthesia Assessment:                           - Prior to the procedure, a History and Physical                            was performed, and patient medications and                            allergies were reviewed. The patient's tolerance of                            previous anesthesia was also reviewed. The risks                            and benefits of the procedure and the sedation                            options and risks were discussed with the patient.                            All questions were answered, and informed consent                            was obtained. Prior Anticoagulants: The patient has                            taken no anticoagulant or antiplatelet agents. ASA                            Grade Assessment: II - A patient with mild systemic                            disease. After reviewing the risks and benefits,  the patient was deemed in satisfactory condition to                            undergo the procedure.                           After obtaining informed consent, the colonoscope                            was passed under direct vision. Throughout the                            procedure, the patient's blood pressure, pulse, and                            oxygen saturations were  monitored continuously. The                            CF [id number] #[id number] was introduced through the anus                            and advanced to the cecum, identified by                            appendiceal orifice and ileocecal valve. The                            colonoscopy was performed without difficulty. The                            patient tolerated the procedure well. The quality                            of the bowel preparation was good. The ileocecal                            valve, appendiceal orifice, and rectum were                            photographed. Scope In: 11:51:39 AM Scope Out: 12:05:33 PM Scope Withdrawal Time: 0 hours 10 minutes 1 second  Total Procedure Duration: 0 hours 13 minutes 54 seconds  Findings:                 The digital rectal exam was normal.                           Two sessile polyps were found in the descending                            colon. The polyps were 3 to 4 mm in size. These                            polyps were removed with a cold snare. Resection  and retrieval were complete.                           Multiple medium-mouthed and small-mouthed                            diverticula were found in the sigmoid colon and                            descending colon.                           Internal hemorrhoids were found during                            retroflexion. The hemorrhoids were small. Complications:            No immediate complications. Estimated Blood Loss:     Estimated blood loss was minimal. Impression:               - Two 3 to 4 mm polyps in the descending colon,                            removed with a cold snare. Resected and retrieved.                           - Mild diverticulosis in the sigmoid colon and in                            the descending colon.                           - Small internal hemorrhoids. Recommendation:           - Patient has a contact number  available for                            emergencies. The signs and symptoms of potential                            delayed complications were discussed with the                            patient. Return to normal activities tomorrow.                            Written discharge instructions were provided to the                            patient.                           - Resume previous diet.                           - Continue present medications.                           -  Await pathology results.                           - Repeat colonoscopy is recommended for                            surveillance. The colonoscopy date will be                            determined after pathology results from today's                            exam become available for review. Beverley Fiedler, MD 08/26/2023 12:09:15 PM This report has been signed electronically.

## 2023-08-26 NOTE — Patient Instructions (Signed)

## 2023-08-26 NOTE — Progress Notes (Signed)
VS by SS  Pt's states no medical or surgical changes since previsit or office visit.  

## 2023-08-27 ENCOUNTER — Telehealth: Payer: Self-pay

## 2023-08-27 NOTE — Telephone Encounter (Signed)
  Follow up Call-     08/26/2023   11:08 AM  Call back number  Post procedure Call Back phone  # [phone]  Permission to leave phone message Yes     Patient questions:  Do you have a fever, pain , or abdominal swelling? No. Pain Score  0 *  Have you tolerated food without any problems? Yes.    Have you been able to return to your normal activities? Yes.    Do you have any questions about your discharge instructions: Diet   No. Medications  No. Follow up visit  No.  Do you have questions or concerns about your Care? No.  Actions: * If pain score is 4 or above: No action needed, pain <4.

## 2023-08-28 LAB — SURGICAL PATHOLOGY

## 2023-08-29 ENCOUNTER — Encounter: Payer: Self-pay | Admitting: Internal Medicine

## 2023-09-23 ENCOUNTER — Other Ambulatory Visit: Payer: TRICARE For Life (TFL)

## 2023-09-25 ENCOUNTER — Ambulatory Visit: Payer: TRICARE For Life (TFL) | Admitting: Family Medicine

## 2023-10-07 ENCOUNTER — Ambulatory Visit: Payer: TRICARE For Life (TFL) | Admitting: Nutrition

## 2023-10-21 ENCOUNTER — Ambulatory Visit: Payer: Medicare Other

## 2023-10-21 VITALS — Ht 68.0 in | Wt 162.0 lb

## 2023-10-21 DIAGNOSIS — Z0000 Encounter for general adult medical examination without abnormal findings: Secondary | ICD-10-CM

## 2023-10-21 NOTE — Patient Instructions (Signed)
Mr. Whitesell , Thank you for taking time to come for your Medicare Wellness Visit. I appreciate your ongoing commitment to your health goals. Please review the following plan we discussed and let me know if I can assist you in the future.   Referrals/Orders/Follow-Ups/Clinician Recommendations: Aim for 30 minutes of exercise or brisk walking, 6-8 glasses of water, and 5 servings of fruits and vegetables each day.  This is a list of the screening recommended for you and due dates:  Health Maintenance  Topic Date Due   Complete foot exam   09/11/2023   COVID-19 Vaccine (6 - 2024-25 season) 09/30/2023   Hemoglobin A1C  09/25/2023   Yearly kidney health urinalysis for diabetes  12/25/2023   Eye exam for diabetics  03/11/2024   Yearly kidney function blood test for diabetes  03/24/2024   Medicare Annual Wellness Visit  10/20/2024   DTaP/Tdap/Td vaccine (3 - Td or Tdap) 07/02/2028   Colon Cancer Screening  08/25/2030   Pneumonia Vaccine  Completed   Flu Shot  Completed   Hepatitis C Screening  Completed   Zoster (Shingles) Vaccine  Completed   HPV Vaccine  Aged Out    Advanced directives: (ACP Link)Information on Advanced Care Planning can be found at [facility] [place] Advance Health Care Directives Advance Health Care Directives ([email or web address]   Next Medicare Annual Wellness Visit scheduled for next year: Yes

## 2023-10-21 NOTE — Progress Notes (Signed)
Subjective:   Terry [name] is a 67 y.o. male who presents for Medicare Annual/Subsequent preventive examination.  Visit Complete: Virtual I connected with  Maylon Peppers on 10/21/23 by a audio enabled telemedicine application and verified that I am speaking with the correct person using two identifiers.  Patient Location: Home  Provider Location: Home Office  I discussed the limitations of evaluation and management by telemedicine. The patient expressed understanding and agreed to proceed.  Vital Signs: Because this visit was a virtual/telehealth visit, some criteria may be missing or patient reported. Any vitals not documented were not able to be obtained and vitals that have been documented are patient reported.  Cardiac Risk Factors include: advanced age (>52men, >1 women);diabetes mellitus;dyslipidemia;hypertension;male gender     Objective:    Today's Vitals   10/21/23 1053  Weight: 162 lb (73.5 kg)  Height: 5\' 8"  (1.727 m)   Body mass index is 24.63 kg/m.     10/21/2023   11:07 AM 10/15/2022   10:13 AM  Advanced Directives  Does Patient Have a Medical Advance Directive? No Yes  Type of Advance Directive  Healthcare Power of Attorney  Does patient want to make changes to medical advance directive?  No - Patient declined  Copy of Healthcare Power of Attorney in Chart?  No - copy requested  Would patient like information on creating a medical advance directive? Yes (MAU/Ambulatory/Procedural Areas - Information given)     Current Medications (verified) Outpatient Encounter Medications as of 10/21/2023  Medication Sig   amLODipine (NORVASC) 5 MG tablet Take 1 tablet (5 mg total) by mouth daily.   atorvastatin (LIPITOR) 20 MG tablet Take 1 tablet (20 mg total) by mouth daily. as directed   bimatoprost (LUMIGAN) 0.01 % SOLN 1 drop at bedtime.   Cholecalciferol (VITAMIN D3) 5000 units CAPS Take 1 capsule by mouth 3 (three) times a week.   Cinnamon 500 MG TABS  Take 1 tablet by mouth 2 (two) times daily.   dorzolamide-timolol (COSOPT) 2-0.5 % ophthalmic solution Place 1 drop into both eyes 2 (two) times daily.   fluticasone (FLONASE) 50 MCG/ACT nasal spray Place 2 sprays into both nostrils daily.   glucose blood (FREESTYLE LITE) test strip USE TO TEST BLOOD SUGAR TWICE A DAY   hydrochlorothiazide (MICROZIDE) 12.5 MG capsule Take 1 capsule (12.5 mg total) by mouth daily.   icosapent Ethyl (VASCEPA) 1 g capsule Take 2 capsules (2 g total) by mouth 2 (two) times daily.   metFORMIN (GLUCOPHAGE-XR) 500 MG 24 hr tablet TAKE 2 TABLETS DAILY WITH BREAKFAST   olmesartan (BENICAR) 40 MG tablet Take 1 tablet (40 mg total) by mouth daily.   Semaglutide,0.25 or 0.5MG /DOS, (OZEMPIC, 0.25 OR 0.5 MG/DOSE,) 2 MG/3ML SOPN Inject 0.5 mg into the skin every 7 (seven) days.   testosterone (ANDROGEL) 50 MG/5GM (1%) GEL PLACE 5 GRAMS (1 PACKET) ONTO THE SKIN EVERY OTHER DAY   No facility-administered encounter medications on file as of 10/21/2023.    Allergies (verified) Jardiance [empagliflozin] and Penicillins   History: Past Medical History:  Diagnosis Date   Diabetes mellitus without complication (HCC)    on meds   Elevated blood sugar    Esophageal reflux    hx of   Essential hypertension, benign    on meds   Glaucoma    on meds (drops)   Hyperplasia of prostate    Internal hemorrhoids    Other and unspecified hyperlipidemia    on meds   Other testicular hypofunction  Sleep apnea    no longer uses CPAP- weight lost   Tubular adenoma of colon    Unspecified glaucoma(365.9)    Unspecified hemorrhoids without mention of complication    Past Surgical History:  Procedure Laterality Date   COLONOSCOPY  2019   JMP-MAC-suprep(good)-tics/int hems/polyps   DENTAL SURGERY  1972   LIPOMA EXCISION Right 2000   shoulder   Family History  Problem Relation Age of Onset   Heart disease Mother    Stroke Father    Colon cancer Paternal Uncle        40's    Esophageal cancer Neg Hx    Rectal cancer Neg Hx    Stomach cancer Neg Hx    Social History   Socioeconomic History   Marital status: Married    Spouse name: Not on file   Number of children: Not on file   Years of education: Not on file   Highest education level: Not on file  Occupational History   Not on file  Tobacco Use   Smoking status: Never   Smokeless tobacco: Never  Vaping Use   Vaping status: Never Used  Substance and Sexual Activity   Alcohol use: Not Currently    Comment: VERY RARE   Drug use: No   Sexual activity: Not Currently  Other Topics Concern   Not on file  Social History Narrative   Not on file   Social Drivers of Health   Financial Resource Strain: Low Risk  (10/21/2023)   Overall Financial Resource Strain (CARDIA)    Difficulty of Paying Living Expenses: Not hard at all  Food Insecurity: No Food Insecurity (10/21/2023)   Hunger Vital Sign    Worried About Running Out of Food in the Last Year: Never true    Ran Out of Food in the Last Year: Never true  Transportation Needs: No Transportation Needs (10/21/2023)   PRAPARE - Administrator, Civil Service (Medical): No    Lack of Transportation (Non-Medical): No  Physical Activity: Sufficiently Active (10/21/2023)   Exercise Vital Sign    Days of Exercise per Week: 5 days    Minutes of Exercise per Session: 30 min  Stress: No Stress Concern Present (10/21/2023)   [employer] of Occupational Health - Occupational Stress Questionnaire    Feeling of Stress : Not at all  Social Connections: Socially Integrated (10/21/2023)   Social Connection and Isolation Panel [NHANES]    Frequency of Communication with Friends and Family: More than three times a week    Frequency of Social Gatherings with Friends and Family: Three times a week    Attends Religious Services: More than 4 times per year    Active Member of Clubs or Organizations: Yes    Attends Engineer, structural:  More than 4 times per year    Marital Status: Married    Tobacco Counseling Counseling given: Not Answered   Clinical Intake:  Pre-visit preparation completed: Yes  Pain : No/denies pain     Diabetes: Yes CBG done?: No Did pt. bring in CBG monitor from home?: No  How often do you need to have someone help you when you read instructions, pamphlets, or other written materials from your doctor or pharmacy?: 1 - Never  Interpreter Needed?: No  Information entered by :: Kandis Fantasia LPN   Activities of Daily Living    10/21/2023   10:54 AM  In your present state of health, do you have any difficulty performing  the following activities:  Hearing? 0  Vision? 0  Difficulty concentrating or making decisions? 0  Walking or climbing stairs? 0  Dressing or bathing? 0  Doing errands, shopping? 0  Preparing Food and eating ? N  Using the Toilet? N  In the past six months, have you accidently leaked urine? N  Do you have problems with loss of bowel control? N  Managing your Medications? N  Managing your Finances? N  Housekeeping or managing your Housekeeping? N    Patient Care Team: Raliegh Ip, DO as PCP - General (Family Medicine) Nelson Chimes, MD as Attending Physician (Ophthalmology)  Indicate any recent Medical Services you may have received from other than [facility] providers in the past year (date may be approximate).     Assessment:   This is a routine wellness examination for [place].  Hearing/Vision screen Hearing Screening - Comments:: Denies hearing difficulties   Vision Screening - Comments:: Wears rx glasses - up to date with routine eye exams with [facility]     Goals Addressed             This Visit's Progress    Remain active and indpendent        Depression Screen    10/21/2023   11:06 AM 03/25/2023    8:35 AM 03/19/2023    9:40 AM 12/24/2022   10:24 AM 10/15/2022   10:11 AM 09/10/2022    1:37 PM 04/10/2022   10:20 AM  PHQ 2/9  Scores  PHQ - 2 Score 0 0 0 0 0 0 0  PHQ- 9 Score  0  0       Fall Risk    10/21/2023   11:08 AM 03/25/2023    8:35 AM 03/19/2023    9:40 AM 12/24/2022   10:24 AM 10/15/2022    9:52 AM  Fall Risk   Falls in the past year? 0 0 0 0 0  Number falls in past yr: 0 0 0  0  Injury with Fall? 0 0 0  0  Risk for fall due to : No Fall Risks No Fall Risks   No Fall Risks  Follow up Falls prevention discussed;Education provided;Falls evaluation completed Education provided   Falls evaluation completed;Education provided;Falls prevention discussed    MEDICARE RISK AT HOME: Medicare Risk at Home Any stairs in or around the home?: No If so, are there any without handrails?: No Home free of loose throw rugs in walkways, pet beds, electrical cords, etc?: Yes Adequate lighting in your home to reduce risk of falls?: Yes Life alert?: No Use of a cane, walker or w/c?: No Grab bars in the bathroom?: Yes Shower chair or bench in shower?: No Elevated toilet seat or a handicapped toilet?: Yes  TIMED UP AND GO:  Was the test performed?  No    Cognitive Function:        10/21/2023   11:08 AM 10/15/2022    9:50 AM  6CIT Screen  What Year? 0 points 0 points  What month? 0 points 0 points  What time? 0 points 0 points  Count back from 20 0 points 0 points  Months in reverse 0 points 0 points  Repeat phrase 0 points 0 points  Total Score 0 points 0 points    Immunizations Immunization History  Administered Date(s) Administered   Fluad Quad(high Dose 65+) 08/19/2020, 07/20/2021, 08/01/2022   Influenza Split 10/13/2015   Influenza Whole 08/01/2010   Influenza,inj,Quad PF,6+ Mos 08/01/2016, 08/30/2017, 10/14/2018,  08/11/2019   Influenza-Unspecified 08/05/2023   Moderna Covid-19 Vaccine Bivalent Booster 63yrs & up 08/08/2021   Moderna Sars-Covid-2 Vaccination 01/05/2020, 02/02/2020, 02/28/2021   PNEUMOCOCCAL CONJUGATE-20 10/16/2021   Pneumococcal Polysaccharide-23 08/20/2019   Tdap  03/10/2008, 07/02/2018   Unspecified SARS-COV-2 Vaccination 08/05/2023   Zoster Recombinant(Shingrix) 09/10/2022, 04/08/2023   Zoster, Live 01/11/2017    TDAP status: Up to date  Flu Vaccine status: Up to date  Pneumococcal vaccine status: Up to date  Covid-19 vaccine status: Information provided on how to obtain vaccines.   Qualifies for Shingles Vaccine? Yes   Zostavax completed Yes   Shingrix Completed?: Yes  Screening Tests Health Maintenance  Topic Date Due   FOOT EXAM  09/11/2023   COVID-19 Vaccine (6 - 2024-25 season) 09/30/2023   HEMOGLOBIN A1C  09/25/2023   Diabetic kidney evaluation - Urine ACR  12/25/2023   OPHTHALMOLOGY EXAM  03/11/2024   Diabetic kidney evaluation - eGFR measurement  03/24/2024   Medicare Annual Wellness (AWV)  10/20/2024   DTaP/Tdap/Td (3 - Td or Tdap) 07/02/2028   Colonoscopy  08/25/2030   Pneumonia Vaccine 70+ Years old  Completed   INFLUENZA VACCINE  Completed   Hepatitis C Screening  Completed   Zoster Vaccines- Shingrix  Completed   HPV VACCINES  Aged Out    Health Maintenance  Health Maintenance Due  Topic Date Due   FOOT EXAM  09/11/2023   COVID-19 Vaccine (6 - 2024-25 season) 09/30/2023   HEMOGLOBIN A1C  09/25/2023    Colorectal cancer screening: Type of screening: Colonoscopy. Completed 08/26/23. Repeat every 7 years  Lung Cancer Screening: (Low Dose CT Chest recommended if Age 66-80 years, 20 pack-year currently smoking OR have quit w/in 15years.) does not qualify.   Lung Cancer Screening Referral: n/a  Additional Screening:  Hepatitis C Screening: does qualify; Completed 08/30/17  Vision Screening: Recommended annual ophthalmology exams for early detection of glaucoma and other disorders of the eye. Is the patient up to date with their annual eye exam?  Yes  Who is the provider or what is the name of the office in which the patient attends annual eye exams? [facility]  If pt is not established with a provider, would  they like to be referred to a provider to establish care? No .   Dental Screening: Recommended annual dental exams for proper oral hygiene  Diabetic Foot Exam: Diabetic Foot Exam: Overdue, Pt has been advised about the importance in completing this exam. Pt is scheduled for diabetic foot exam on at next office visit.  Community Resource Referral / Chronic Care Management: CRR required this visit?  No   CCM required this visit?  No     Plan:     I have personally reviewed and noted the following in the patient's chart:   Medical and social history Use of alcohol, tobacco or illicit drugs  Current medications and supplements including opioid prescriptions. Patient is not currently taking opioid prescriptions. Functional ability and status Nutritional status Physical activity Advanced directives List of other physicians Hospitalizations, surgeries, and ER visits in previous 12 months Vitals Screenings to include cognitive, depression, and falls Referrals and appointments  In addition, I have reviewed and discussed with patient certain preventive protocols, quality metrics, and best practice recommendations. A written personalized care plan for preventive services as well as general preventive health recommendations were provided to patient.     Kandis Fantasia [place], [place]   [id number]   After Visit Summary: (MyChart) Due to this being a  telephonic visit, the after visit summary with patients personalized plan was offered to patient via MyChart   Nurse Notes: No concerns at this time

## 2023-11-12 ENCOUNTER — Other Ambulatory Visit: Payer: Self-pay | Admitting: Family Medicine

## 2023-11-12 DIAGNOSIS — E1169 Type 2 diabetes mellitus with other specified complication: Secondary | ICD-10-CM

## 2023-12-02 ENCOUNTER — Other Ambulatory Visit: Payer: Medicare Other

## 2023-12-02 LAB — BAYER DCA HB A1C WAIVED: HB A1C (BAYER DCA - WAIVED): 6.7 % — ABNORMAL HIGH (ref 4.8–5.6)

## 2023-12-03 ENCOUNTER — Encounter: Payer: Self-pay | Admitting: Family Medicine

## 2023-12-03 LAB — CBC WITH DIFFERENTIAL/PLATELET
Basophils Absolute: 0 10*3/uL (ref 0.0–0.2)
Basos: 0 %
EOS (ABSOLUTE): 0 10*3/uL (ref 0.0–0.4)
Eos: 0 %
Hematocrit: 39.3 % (ref 37.5–51.0)
Hemoglobin: 12.8 g/dL — ABNORMAL LOW (ref 13.0–17.7)
Immature Grans (Abs): 0 10*3/uL (ref 0.0–0.1)
Immature Granulocytes: 0 %
Lymphocytes Absolute: 2.2 10*3/uL (ref 0.7–3.1)
Lymphs: 20 %
MCH: 30.8 pg (ref 26.6–33.0)
MCHC: 32.6 g/dL (ref 31.5–35.7)
MCV: 95 fL (ref 79–97)
Monocytes Absolute: 0.8 10*3/uL (ref 0.1–0.9)
Monocytes: 7 %
Neutrophils Absolute: 7.5 10*3/uL — ABNORMAL HIGH (ref 1.4–7.0)
Neutrophils: 73 %
Platelets: 279 10*3/uL (ref 150–450)
RBC: 4.16 x10E6/uL (ref 4.14–5.80)
RDW: 13.2 % (ref 11.6–15.4)
WBC: 10.5 10*3/uL (ref 3.4–10.8)

## 2023-12-03 LAB — CMP14+EGFR
ALT: 31 [IU]/L (ref 0–44)
AST: 18 [IU]/L (ref 0–40)
Albumin: 4.6 g/dL (ref 3.9–4.9)
Alkaline Phosphatase: 60 [IU]/L (ref 44–121)
BUN/Creatinine Ratio: 19 (ref 10–24)
BUN: 17 mg/dL (ref 8–27)
Bilirubin Total: 0.6 mg/dL (ref 0.0–1.2)
CO2: 22 mmol/L (ref 20–29)
Calcium: 9.7 mg/dL (ref 8.6–10.2)
Chloride: 97 mmol/L (ref 96–106)
Creatinine, Ser: 0.89 mg/dL (ref 0.76–1.27)
Globulin, Total: 2.4 g/dL (ref 1.5–4.5)
Glucose: 180 mg/dL — ABNORMAL HIGH (ref 70–99)
Potassium: 4.1 mmol/L (ref 3.5–5.2)
Sodium: 136 mmol/L (ref 134–144)
Total Protein: 7 g/dL (ref 6.0–8.5)
eGFR: 94 mL/min/{1.73_m2} (ref >59)

## 2023-12-03 LAB — TESTOSTERONE: Testosterone: 1008 ng/dL — ABNORMAL HIGH (ref 264–916)

## 2023-12-03 NOTE — Telephone Encounter (Signed)
He is scheduled to be seen tomorrow. Will you discuss with him at visit? Does he need to keep this visit?

## 2023-12-04 ENCOUNTER — Ambulatory Visit (INDEPENDENT_AMBULATORY_CARE_PROVIDER_SITE_OTHER): Payer: Medicare Other | Admitting: Family Medicine

## 2023-12-04 ENCOUNTER — Telehealth: Payer: Self-pay | Admitting: Family Medicine

## 2023-12-04 ENCOUNTER — Encounter: Payer: Self-pay | Admitting: Family Medicine

## 2023-12-04 VITALS — BP 111/70 | HR 81 | Temp 97.9°F | Ht 68.0 in | Wt 169.0 lb

## 2023-12-04 DIAGNOSIS — E349 Endocrine disorder, unspecified: Secondary | ICD-10-CM

## 2023-12-04 DIAGNOSIS — Z7984 Long term (current) use of oral hypoglycemic drugs: Secondary | ICD-10-CM

## 2023-12-04 DIAGNOSIS — Z5181 Encounter for therapeutic drug level monitoring: Secondary | ICD-10-CM

## 2023-12-04 DIAGNOSIS — N390 Urinary tract infection, site not specified: Secondary | ICD-10-CM

## 2023-12-04 DIAGNOSIS — Z79890 Hormone replacement therapy (postmenopausal): Secondary | ICD-10-CM

## 2023-12-04 DIAGNOSIS — D649 Anemia, unspecified: Secondary | ICD-10-CM

## 2023-12-04 DIAGNOSIS — E1169 Type 2 diabetes mellitus with other specified complication: Secondary | ICD-10-CM | POA: Diagnosis not present

## 2023-12-04 DIAGNOSIS — E1159 Type 2 diabetes mellitus with other circulatory complications: Secondary | ICD-10-CM

## 2023-12-04 DIAGNOSIS — Z7985 Long-term (current) use of injectable non-insulin antidiabetic drugs: Secondary | ICD-10-CM

## 2023-12-04 DIAGNOSIS — E785 Hyperlipidemia, unspecified: Secondary | ICD-10-CM

## 2023-12-04 DIAGNOSIS — I152 Hypertension secondary to endocrine disorders: Secondary | ICD-10-CM

## 2023-12-04 DIAGNOSIS — N400 Enlarged prostate without lower urinary tract symptoms: Secondary | ICD-10-CM

## 2023-12-04 LAB — MICROSCOPIC EXAMINATION
Epithelial Cells (non renal): NONE SEEN /[HPF] (ref 0–10)
RBC, Urine: NONE SEEN /[HPF] (ref 0–2)
WBC, UA: >30 /[HPF] — AB (ref 0–5)
Yeast, UA: NONE SEEN

## 2023-12-04 LAB — URINALYSIS, ROUTINE W REFLEX MICROSCOPIC
Bilirubin, UA: NEGATIVE
Glucose, UA: NEGATIVE
Ketones, UA: NEGATIVE
Nitrite, UA: POSITIVE — AB
Protein,UA: NEGATIVE
Specific Gravity, UA: 1.025 (ref 1.005–1.030)
Urobilinogen, Ur: 0.2 mg/dL (ref 0.2–1.0)
pH, UA: 5.5 (ref 5.0–7.5)

## 2023-12-04 MED ORDER — TESTOSTERONE 50 MG/5GM (1%) TD GEL: TRANSDERMAL | 2 refills | Status: DC

## 2023-12-04 MED ORDER — CEPHALEXIN 500 MG PO CAPS: 500.0000 mg | ORAL_CAPSULE | Freq: Four times a day (QID) | ORAL | 0 refills | Status: AC

## 2023-12-04 NOTE — Progress Notes (Signed)
Subjective: CC:DM PCP: Raliegh Ip, DO [id number] Terry [name] is a 68 y.o. male presenting to clinic today for:  1. Type 2 Diabetes with hypertension, hyperlipidemia:  Reports compliance with medications.  He reports rare sensation of feeling a little bloated or full.  He has had some constipation but is able to regulate this through diet.  Diabetes Health Maintenance Due  Topic Date Due   FOOT EXAM  09/11/2023   OPHTHALMOLOGY EXAM  03/11/2024   HEMOGLOBIN A1C  05/31/2024    Last A1c:  Lab Results  Component Value Date   HGBA1C 6.7 (H) 12/02/2023    ROS: Denies any dizziness, LOC, polyuria, polydipsia, unintended weight loss/gain, foot ulcerations, numbness or tingling in extremities, shortness of breath or chest pain.  2.  Recent UTI Patient had a UTI recently that really caused him to be quite sick.  He took antibiotics for this and symptoms seem to resolve.  He wanted to get another urinalysis today because he is worried that his bladder function is not totally normal.  He is not sure if this is a prostate issue or what.  Asking for referral to urology today.  3.  Hypogonadism Treated with topical testosterone every other day.  Had labs done recently.  Reports no gynecomastia or testicular shrinkage.  4.  Anemia Found incidentally on labs recently.  He was ill with both UTI and URI recently.  He denies any bleeding anywhere.  Overall he feels well.  ROS: Per HPI  Allergies  Allergen Reactions   Jardiance [Empagliflozin]     Recurrent UTI   Penicillins Nausea And Vomiting   Past Medical History:  Diagnosis Date   Diabetes mellitus without complication (HCC)    on meds   Elevated blood sugar    Esophageal reflux    hx of   Essential hypertension, benign    on meds   Glaucoma    on meds (drops)   Hyperplasia of prostate    Internal hemorrhoids    Other and unspecified hyperlipidemia    on meds   Other testicular hypofunction    Sleep apnea    no  longer uses CPAP- weight lost   Tubular adenoma of colon    Unspecified glaucoma(365.9)    Unspecified hemorrhoids without mention of complication     Current Outpatient Medications:    amLODipine (NORVASC) 5 MG tablet, Take 1 tablet (5 mg total) by mouth daily., Disp: 90 tablet, Rfl: 3   atorvastatin (LIPITOR) 20 MG tablet, Take 1 tablet (20 mg total) by mouth daily. as directed, Disp: 90 tablet, Rfl: 3   bimatoprost (LUMIGAN) 0.01 % SOLN, 1 drop at bedtime., Disp: , Rfl:    Cholecalciferol (VITAMIN D3) 5000 units CAPS, Take 1 capsule by mouth 3 (three) times a week., Disp: , Rfl:    Cinnamon 500 MG TABS, Take 1 tablet by mouth 2 (two) times daily., Disp: , Rfl:    dorzolamide-timolol (COSOPT) 2-0.5 % ophthalmic solution, Place 1 drop into both eyes 2 (two) times daily., Disp: , Rfl:    fluticasone (FLONASE) 50 MCG/ACT nasal spray, Place 2 sprays into both nostrils daily., Disp: 48 g, Rfl: 3   glucose blood (FREESTYLE LITE) test strip, USE TO TEST BLOOD SUGAR TWICE A DAY, Disp: 200 strip, Rfl: 3   hydrochlorothiazide (MICROZIDE) 12.5 MG capsule, Take 1 capsule (12.5 mg total) by mouth daily., Disp: 90 capsule, Rfl: 3   icosapent Ethyl (VASCEPA) 1 g capsule, Take 2 capsules (2 g total) by  mouth 2 (two) times daily., Disp: 360 capsule, Rfl: 3   metFORMIN (GLUCOPHAGE-XR) 500 MG 24 hr tablet, TAKE 2 TABLETS DAILY WITH BREAKFAST, Disp: 180 tablet, Rfl: 0   olmesartan (BENICAR) 40 MG tablet, Take 1 tablet (40 mg total) by mouth daily., Disp: 90 tablet, Rfl: 3   Semaglutide,0.25 or 0.5MG /DOS, (OZEMPIC, 0.25 OR 0.5 MG/DOSE,) 2 MG/3ML SOPN, Inject 0.5 mg into the skin every 7 (seven) days., Disp: 9 mL, Rfl: 3   testosterone (ANDROGEL) 50 MG/5GM (1%) GEL, PLACE 5 GRAMS (1 PACKET) ONTO THE SKIN EVERY OTHER DAY, Disp: 450 g, Rfl: 2 Social History   Socioeconomic History   Marital status: Married    Spouse name: Not on file   Number of children: Not on file   Years of education: Not on file   Highest  education level: Not on file  Occupational History   Not on file  Tobacco Use   Smoking status: Never   Smokeless tobacco: Never  Vaping Use   Vaping status: Never Used  Substance and Sexual Activity   Alcohol use: Not Currently    Comment: VERY RARE   Drug use: No   Sexual activity: Not Currently  Other Topics Concern   Not on file  Social History Narrative   Not on file   Social Drivers of Health   Financial Resource Strain: Low Risk  (10/21/2023)   Overall Financial Resource Strain (CARDIA)    Difficulty of Paying Living Expenses: Not hard at all  Food Insecurity: No Food Insecurity (10/21/2023)   Hunger Vital Sign    Worried About Running Out of Food in the Last Year: Never true    Ran Out of Food in the Last Year: Never true  Transportation Needs: No Transportation Needs (10/21/2023)   PRAPARE - Administrator, Civil Service (Medical): No    Lack of Transportation (Non-Medical): No  Physical Activity: Sufficiently Active (10/21/2023)   Exercise Vital Sign    Days of Exercise per Week: 5 days    Minutes of Exercise per Session: 30 min  Stress: No Stress Concern Present (10/21/2023)   [employer] of Occupational Health - Occupational Stress Questionnaire    Feeling of Stress : Not at all  Social Connections: Socially Integrated (10/21/2023)   Social Connection and Isolation Panel [NHANES]    Frequency of Communication with Friends and Family: More than three times a week    Frequency of Social Gatherings with Friends and Family: Three times a week    Attends Religious Services: More than 4 times per year    Active Member of Clubs or Organizations: Yes    Attends Banker Meetings: More than 4 times per year    Marital Status: Married  Catering manager Violence: Not At Risk (10/21/2023)   Humiliation, Afraid, Rape, and Kick questionnaire    Fear of Current or Ex-Partner: No    Emotionally Abused: No    Physically Abused: No    Sexually  Abused: No   Family History  Problem Relation Age of Onset   Heart disease Mother    Stroke Father    Colon cancer Paternal Uncle        26's   Esophageal cancer Neg Hx    Rectal cancer Neg Hx    Stomach cancer Neg Hx     Objective: Office vital signs reviewed. BP 111/70   Pulse 81   Temp 97.9 F (36.6 C)   Ht 5\' 8"  (1.727 m)  Wt 169 lb (76.7 kg)   SpO2 97%   BMI 25.70 kg/m   Physical Examination:  General: Awake, alert, well nourished, No acute distress HEENT: sclera white, MMM Cardio: regular rate and rhythm, S1S2 heard, no murmurs appreciated Pulm: clear to auscultation bilaterally, no wheezes, rhonchi or rales; normal work of breathing on room air  Diabetic Foot Exam - Simple   Simple Foot Form Diabetic Foot exam was performed with the following findings: Yes 12/04/2023  4:52 PM  Visual Inspection No deformities, no ulcerations, no other skin breakdown bilaterally: Yes Sensation Testing Intact to touch and monofilament testing bilaterally: Yes Pulse Check Posterior Tibialis and Dorsalis pulse intact bilaterally: Yes Comments    Assessment/ Plan: 68 y.o. male   Anemia, unspecified type - Plan: Fecal occult blood, imunochemical, Anemia Profile B  Type 2 diabetes mellitus with other specified complication, without long-term current use of insulin (HCC)  Hyperlipidemia associated with type 2 diabetes mellitus (HCC)  Hypertension associated with diabetes (HCC)  Encounter for monitoring testosterone replacement therapy  Testosterone deficiency - Plan: Ambulatory referral to Urology, testosterone (ANDROGEL) 50 MG/5GM (1%) GEL  Benign prostatic hyperplasia, unspecified whether lower urinary tract symptoms present - Plan: Ambulatory referral to Urology  Recurrent UTI - Plan: Urinalysis, Routine w reflex microscopic, cephALEXin (KEFLEX) 500 MG capsule, Urine Culture  Will look for causes of anemia.  FOBT recommended.  Anemia profile be collected  A1c  appropriate.  No changes are needed.  Reviewed labs today.  Blood pressure controlled.  Continue current medications as prescribed  His testosterone level was elevated and I am going to have him go to every 3 days with testosterone supplementation but I am going to go ahead and see if urology can have some input.  He may need to just come off the testosterone to be honest  Refer to urology for BPH and recurrent UTI.  Retreat.  Keflex sent.  Urine culture added  Raliegh Ip, DO [facility] [phone]

## 2023-12-05 ENCOUNTER — Other Ambulatory Visit: Payer: Medicare Other

## 2023-12-05 DIAGNOSIS — D649 Anemia, unspecified: Secondary | ICD-10-CM

## 2023-12-05 LAB — ANEMIA PROFILE B
Basophils Absolute: 0 10*3/uL (ref 0.0–0.2)
Basos: 0 %
EOS (ABSOLUTE): 0.1 10*3/uL (ref 0.0–0.4)
Eos: 1 %
Ferritin: 475 ng/mL — ABNORMAL HIGH (ref 30–400)
Folate: 8.4 ng/mL (ref >3.0)
Hematocrit: 37.4 % — ABNORMAL LOW (ref 37.5–51.0)
Hemoglobin: 12.8 g/dL — ABNORMAL LOW (ref 13.0–17.7)
Immature Grans (Abs): 0 10*3/uL (ref 0.0–0.1)
Immature Granulocytes: 0 %
Iron Saturation: 22 % (ref 15–55)
Iron: 77 ug/dL (ref 38–169)
Lymphocytes Absolute: 3 10*3/uL (ref 0.7–3.1)
Lymphs: 38 %
MCH: 31.3 pg (ref 26.6–33.0)
MCHC: 34.2 g/dL (ref 31.5–35.7)
MCV: 91 fL (ref 79–97)
Monocytes Absolute: 0.6 10*3/uL (ref 0.1–0.9)
Monocytes: 8 %
Neutrophils Absolute: 4.2 10*3/uL (ref 1.4–7.0)
Neutrophils: 53 %
Platelets: 287 10*3/uL (ref 150–450)
RBC: 4.09 x10E6/uL — ABNORMAL LOW (ref 4.14–5.80)
RDW: 13 % (ref 11.6–15.4)
Retic Ct Pct: 2.1 % (ref 0.6–2.6)
Total Iron Binding Capacity: 355 ug/dL (ref 250–450)
UIBC: 278 ug/dL (ref 111–343)
Vitamin B-12: 655 pg/mL (ref 232–1245)
WBC: 7.9 10*3/uL (ref 3.4–10.8)

## 2023-12-06 ENCOUNTER — Encounter: Payer: Self-pay | Admitting: Family Medicine

## 2023-12-06 LAB — FECAL OCCULT BLOOD, IMMUNOCHEMICAL: Fecal Occult Bld: NEGATIVE

## 2023-12-09 ENCOUNTER — Encounter: Payer: Self-pay | Admitting: Nutrition

## 2023-12-09 ENCOUNTER — Encounter: Payer: Medicare Other | Attending: Family Medicine | Admitting: Nutrition

## 2023-12-09 VITALS — Ht 68.0 in | Wt 168.0 lb

## 2023-12-09 DIAGNOSIS — E118 Type 2 diabetes mellitus with unspecified complications: Secondary | ICD-10-CM | POA: Insufficient documentation

## 2023-12-09 NOTE — Progress Notes (Signed)
Medical Nutrition Therapy  Appointment Start time:  0830  Appointment End time:  0900  Primary concerns today: DM Type 2  Referral diagnosis: E11.8 Preferred learning style: No preference.  Learning readiness: Ready    NUTRITION ASSESSMENT Follow up . 68 yr old wmale here for Type 2 DM. A1C 6.7%, up from 6.2% but down overall from 9.4%. Has a lot of stress right now with his 89 year old mom dying. Just got over a UTI.Eating more more beans, egg whites, eating more vegetables, drinking more water Cut out most all sodas. Has been doing the [facility] living program. Desires to weigh 150-155 lbs. Has an ellipical at home and uses it 30 minutes most days. FBS 127 mg/dl. He is on Ozempic weekly and Metformin twice a day. Feels better.  Lab Results  Component Value Date   HGBA1C 6.7 (H) 12/02/2023      Latest Ref Rng & Units 12/02/2023    9:05 AM 03/25/2023    9:13 AM 04/12/2022    8:20 AM  CMP  Glucose 70 - 99 mg/dL 409  811  914   BUN 8 - 27 mg/dL 17  16  18    Creatinine 0.76 - 1.27 mg/dL 7.82  9.56  2.13   Sodium 134 - 144 mmol/L 136  137  137   Potassium 3.5 - 5.2 mmol/L 4.1  5.0  4.7   Chloride 96 - 106 mmol/L 97  100  99   CO2 20 - 29 mmol/L 22  23  22    Calcium 8.6 - 10.2 mg/dL 9.7  08.6  57.8   Total Protein 6.0 - 8.5 g/dL 7.0  7.6  7.4   Total Bilirubin 0.0 - 1.2 mg/dL 0.6  0.7  0.6   Alkaline Phos 44 - 121 IU/L 60  54  59   AST 0 - 40 IU/L 18  37  20   ALT 0 - 44 IU/L 31  63  37     Anthropometrics  Wt Readings from Last 3 Encounters:  12/04/23 169 lb (76.7 kg)  10/21/23 162 lb (73.5 kg)  08/26/23 162 lb (73.5 kg)   Ht Readings from Last 3 Encounters:  12/04/23 5\' 8"  (1.727 m)  10/21/23 5\' 8"  (1.727 m)  08/26/23 5\' 8"  (1.727 m)   There is no height or weight on file to calculate BMI. @BMIFA @ Facility age limit for growth %iles is 20 years. Facility age limit for growth %iles is 20 years.   There is no height or weight on file to calculate  BMI. @BMIFA @ Facility age limit for growth %iles is 20 years. Facility age limit for growth %iles is 20 years.    Clinical Medical Hx: See chart Medications: Ozemipic and metformin  Labs:  Lab Results  Component Value Date   HGBA1C 6.7 (H) 12/02/2023      Latest Ref Rng & Units 12/02/2023    9:05 AM 03/25/2023    9:13 AM 04/12/2022    8:20 AM  CMP  Glucose 70 - 99 mg/dL 469  629  528   BUN 8 - 27 mg/dL 17  16  18    Creatinine 0.76 - 1.27 mg/dL 4.13  2.44  0.10   Sodium 134 - 144 mmol/L 136  137  137   Potassium 3.5 - 5.2 mmol/L 4.1  5.0  4.7   Chloride 96 - 106 mmol/L 97  100  99   CO2 20 - 29 mmol/L 22  23  22  Calcium 8.6 - 10.2 mg/dL 9.7  16.1  09.6   Total Protein 6.0 - 8.5 g/dL 7.0  7.6  7.4   Total Bilirubin 0.0 - 1.2 mg/dL 0.6  0.7  0.6   Alkaline Phos 44 - 121 IU/L 60  54  59   AST 0 - 40 IU/L 18  37  20   ALT 0 - 44 IU/L 31  63  37    Lipid Panel     Component Value Date/Time   CHOL 130 03/25/2023 0913   CHOL 132 02/19/2013 1005   TRIG 113 03/25/2023 0913   TRIG 169 (H) 03/07/2016 1552   TRIG 87 02/19/2013 1005   HDL 54 03/25/2023 0913   HDL 52 03/07/2016 1552   HDL 57 02/19/2013 1005   CHOLHDL 2.4 03/25/2023 0913   LDLCALC 56 03/25/2023 0913   LDLCALC 40 07/08/2014 1014   LDLCALC 58 02/19/2013 1005   LABVLDL 20 03/25/2023 0913    Notable Signs/Symptoms: None  Lifestyle & Dietary Hx Married, Renato Gails of a church  Estimated daily fluid intake: 60 oz Supplements:  Sleep: varies 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  As evidenced by A1C 9.4%.Marland Kitchen   NUTRITION INTERVENTION  Nutrition education (E-1) on the following topics:  Nutrition and Diabetes education provided on My Plate, CHO counting, meal planning, portion sizes, timing of meals, avoiding snacks between meals unless having a low blood sugar, target ranges for A1C and blood sugars, signs/symptoms and treatment of hyper/hypoglycemia, monitoring blood sugars, taking medications as prescribed, benefits of exercising 30 minutes per day and prevention of complications of DM. Lifestyle Medicine  - Whole Food, Plant Predominant Nutrition is highly recommended: Eat Plenty of vegetables, Mushrooms, fruits, Legumes, Whole Grains, Nuts, seeds in lieu of processed meats, processed snacks/pastries red meat, poultry, eggs.    -It is better to avoid simple carbohydrates including: Cakes, Sweet Desserts, Ice Cream, Soda (diet and regular), Sweet Tea, Candies, Chips, Cookies, Store Bought Juices, Alcohol in Excess of  1-2 drinks a day, Lemonade,  Artificial Sweeteners, Doughnuts, Coffee Creamers, "Sugar-free" Products, etc, etc.  This is not a complete list.....  Exercise: If you are able: 30 -60 minutes a day ,4 days a week, or 150 minutes a week.  The longer the better.  Combine stretch, strength, and aerobic activities.  If you were told in the past that you have high risk for cardiovascular diseases, you may seek evaluation by your heart doctor prior to initiating moderate to intense exercise programs.   Handouts Provided Include  Lifestyle Medicine Know your numbers   Learning Style & Readiness for Change Teaching method utilized: Visual & Auditory  Demonstrated degree of understanding via: Teach Back  Barriers to learning/adherence to lifestyle change: None  Goals Established by Pt Goal Keep up the great job. Drink 4-5 bottles of water Exercise 4-5 times per week as tolerated. Get Weight below 160 lbs by next  visit.   MONITORING & EVALUATION Dietary intake, weekly physical activity, and blood sugars in 6 months. Recommend to consider stopping Metformin since he is  on Ozempic. Next Steps  Patient is to work on eating more whole plant based foods and meal planning.Marland Kitchen

## 2023-12-09 NOTE — Patient Instructions (Addendum)
Goal Keep up the great job. Drink 4-5 bottles of water Exercise 4-5 times per week as tolerated. Get Weight below 160 lbs by next visit.

## 2024-01-06 ENCOUNTER — Ambulatory Visit: Payer: Medicare Other | Admitting: Urology

## 2024-01-06 VITALS — BP 143/80 | HR 82

## 2024-01-06 DIAGNOSIS — N529 Male erectile dysfunction, unspecified: Secondary | ICD-10-CM | POA: Diagnosis not present

## 2024-01-06 DIAGNOSIS — N400 Enlarged prostate without lower urinary tract symptoms: Secondary | ICD-10-CM | POA: Diagnosis not present

## 2024-01-06 LAB — URINALYSIS, ROUTINE W REFLEX MICROSCOPIC
Bilirubin, UA: NEGATIVE
Glucose, UA: NEGATIVE
Ketones, UA: NEGATIVE
Leukocytes,UA: NEGATIVE
Nitrite, UA: NEGATIVE
Protein,UA: NEGATIVE
RBC, UA: NEGATIVE
Specific Gravity, UA: 1.02 (ref 1.005–1.030)
Urobilinogen, Ur: 0.2 mg/dL (ref 0.2–1.0)
pH, UA: 6 (ref 5.0–7.5)

## 2024-01-06 MED ORDER — TADALAFIL 5 MG PO TABS: 5.0000 mg | ORAL_TABLET | Freq: Every day | ORAL | 3 refills | Status: AC

## 2024-01-06 NOTE — Progress Notes (Unsigned)
 01/06/2024 1:52 PM   Terry [name] [birth year] [id number]  Referring provider: Raliegh Ip, DO [address] [place],  [place] [address]  No chief complaint on file.   HPI:  New pt -   1) BPH - no prior meds or surgery. He felt poorly and had dysuria. Drank more and felt better. Then pain in left flank. UA many bacteria. Took abx - two rounds. Improved. May 2024 PSA 3.1. Has frequency and nocturia. Urgency. Slower stream. IPSS 11. His brother had BPH.   2) ED - noticed more trouble getting and maintaining erection. He is on T replacement.    Today, seen for the above. UA clear.   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.   PMH: Past Medical History:  Diagnosis Date   Diabetes mellitus without complication (HCC)    on meds   Elevated blood sugar    Esophageal reflux    hx of   Essential hypertension, benign    on meds   Glaucoma    on meds (drops)   Hyperplasia of prostate    Internal hemorrhoids    Other and unspecified hyperlipidemia    on meds   Other testicular hypofunction    Sleep apnea    no longer uses CPAP- weight lost   Tubular adenoma of colon    Unspecified glaucoma(365.9)    Unspecified hemorrhoids without mention of complication     Surgical History: Past Surgical History:  Procedure Laterality Date   COLONOSCOPY  2019   JMP-MAC-suprep(good)-tics/int hems/polyps   DENTAL SURGERY  1972   LIPOMA EXCISION Right 2000   shoulder    Home Medications:  Allergies as of 01/06/2024       Reactions   Jardiance [empagliflozin]    Recurrent UTI   Penicillins Nausea And Vomiting        Medication List        Accurate as of January 06, 2024  1:52 PM. If you have any questions, ask your nurse or doctor.          amLODipine 5 MG tablet Commonly known as: NORVASC Take 1 tablet (5 mg total) by mouth daily.   atorvastatin 20 MG tablet Commonly known as: LIPITOR Take 1 tablet (20 mg total) by mouth daily. as directed   bimatoprost 0.01 %  Soln Commonly known as: LUMIGAN 1 drop at bedtime.   Cinnamon 500 MG Tabs Take 1 tablet by mouth 2 (two) times daily.   dorzolamide-timolol 2-0.5 % ophthalmic solution Commonly known as: COSOPT Place 1 drop into both eyes 2 (two) times daily.   fluticasone 50 MCG/ACT nasal spray Commonly known as: FLONASE Place 2 sprays into both nostrils daily.   FREESTYLE LITE test strip Generic drug: glucose blood USE TO TEST BLOOD SUGAR TWICE A DAY   hydrochlorothiazide 12.5 MG capsule Commonly known as: MICROZIDE Take 1 capsule (12.5 mg total) by mouth daily.   icosapent Ethyl 1 g capsule Commonly known as: Vascepa Take 2 capsules (2 g total) by mouth 2 (two) times daily.   metFORMIN 500 MG 24 hr tablet Commonly known as: GLUCOPHAGE-XR TAKE 2 TABLETS DAILY WITH BREAKFAST   olmesartan 40 MG tablet Commonly known as: BENICAR Take 1 tablet (40 mg total) by mouth daily.   Ozempic (0.25 or 0.5 MG/DOSE) 2 MG/3ML Sopn Generic drug: Semaglutide(0.25 or 0.5MG /DOS) Inject 0.5 mg into the skin every 7 (seven) days.   testosterone 50 MG/5GM (1%) Gel Commonly known as: ANDROGEL PLACE 5 GRAMS (1 PACKET)  ONTO THE SKIN EVERY 3 DAYs   Vitamin D3 125 MCG (5000 UT) Caps Take 1 capsule by mouth 3 (three) times a week.        Allergies:  Allergies  Allergen Reactions   Jardiance [Empagliflozin]     Recurrent UTI   Penicillins Nausea And Vomiting    Family History: Family History  Problem Relation Age of Onset   Heart disease Mother    Stroke Father    Colon cancer Paternal Uncle        23's   Esophageal cancer Neg Hx    Rectal cancer Neg Hx    Stomach cancer Neg Hx     Social History:  reports that he has never smoked. He has never used smokeless tobacco. He reports that he does not currently use alcohol. He reports that he does not use drugs.   Physical Exam: BP (!) 143/80   Pulse 82   Constitutional:  Alert and oriented, No acute distress. HEENT: [place] AT, moist mucus  membranes.  Trachea midline, no masses. Cardiovascular: No clubbing, cyanosis, or edema. Respiratory: Normal respiratory effort, no increased work of breathing. GI: Abdomen is soft, nontender, nondistended, no abdominal masses GU: No CVA tenderness Skin: No rashes, bruises or suspicious lesions. Neurologic: Grossly intact, no focal deficits, moving all 4 extremities. Psychiatric: Normal mood and affect.  Laboratory Data: Lab Results  Component Value Date   WBC 7.9 12/04/2023   HGB 12.8 (L) 12/04/2023   HCT 37.4 (L) 12/04/2023   MCV 91 12/04/2023   PLT 287 12/04/2023    Lab Results  Component Value Date   CREATININE 0.89 12/02/2023    Lab Results  Component Value Date   PSA 1.8 07/08/2014   PSA 1.7 10/15/2013    Lab Results  Component Value Date   TESTOSTERONE 1,008 (H) 12/02/2023    Lab Results  Component Value Date   HGBA1C 6.7 (H) 12/02/2023    Urinalysis    Component Value Date/Time   COLORURINE YELLOW 10/26/2008 0722   APPEARANCEUR Cloudy (A) 12/04/2023 1601   LABSPEC 1.020 10/26/2008 0722   PHURINE 7.0 10/26/2008 0722   GLUCOSEU Negative 12/04/2023 1601   HGBUR NEGATIVE 10/26/2008 0722   BILIRUBINUR Negative 12/04/2023 1601   KETONESUR NEGATIVE 10/26/2008 0722   PROTEINUR Negative 12/04/2023 1601   PROTEINUR NEGATIVE 10/26/2008 0722   UROBILINOGEN negative 07/08/2014 1041   UROBILINOGEN 0.2 10/26/2008 0722   NITRITE Positive (A) 12/04/2023 1601   NITRITE NEGATIVE 10/26/2008 0722   LEUKOCYTESUR 2+ (A) 12/04/2023 1601    Lab Results  Component Value Date   LABMICR See below: 12/04/2023   WBCUA >30 (A) 12/04/2023   RBCUA 0-2 02/24/2018   LABEPIT None seen 12/04/2023   MUCUS Present 06/26/2017   BACTERIA Many (A) 12/04/2023    Pertinent Imaging: N/a    Assessment & Plan:    1. Benign prostatic hyperplasia, unspecified whether lower urinary tract symptoms present (Primary) Discussed the nature r/b to surveillance, alpha blocker, 5ari, oab  meds, daily pde5i. He will start tadalafil. Disc eval with pUS and cysto. He declined. He is drinking more water. UA looks good today.  - Urinalysis, Routine w reflex microscopic  2. ED - as above.   No follow-ups on file.  Jerilee Field, MD  [facility]  [address] [place], [place] [address] [phone]

## 2024-01-09 ENCOUNTER — Telehealth: Payer: Self-pay | Admitting: Urology

## 2024-01-09 NOTE — Telephone Encounter (Signed)
 Per MD's CMA rx was re faxed today with confirmation.

## 2024-01-09 NOTE — Telephone Encounter (Signed)
 Patient called about delay in his RX. His pharmacy called him and said they are waiting on the Dr to complete a form.

## 2024-02-10 ENCOUNTER — Other Ambulatory Visit: Payer: Self-pay | Admitting: Family Medicine

## 2024-02-10 DIAGNOSIS — E1169 Type 2 diabetes mellitus with other specified complication: Secondary | ICD-10-CM

## 2024-02-24 ENCOUNTER — Ambulatory Visit: Admitting: Urology

## 2024-02-28 ENCOUNTER — Other Ambulatory Visit: Payer: Self-pay | Admitting: Family Medicine

## 2024-02-28 DIAGNOSIS — E1169 Type 2 diabetes mellitus with other specified complication: Secondary | ICD-10-CM

## 2024-03-06 ENCOUNTER — Other Ambulatory Visit: Payer: Self-pay | Admitting: Family Medicine

## 2024-03-06 DIAGNOSIS — E1169 Type 2 diabetes mellitus with other specified complication: Secondary | ICD-10-CM

## 2024-03-23 ENCOUNTER — Other Ambulatory Visit: Payer: Self-pay | Admitting: Family Medicine

## 2024-03-23 DIAGNOSIS — E1169 Type 2 diabetes mellitus with other specified complication: Secondary | ICD-10-CM

## 2024-03-23 DIAGNOSIS — E1159 Type 2 diabetes mellitus with other circulatory complications: Secondary | ICD-10-CM

## 2024-03-25 ENCOUNTER — Other Ambulatory Visit: Payer: Self-pay | Admitting: Family Medicine

## 2024-03-25 DIAGNOSIS — I152 Hypertension secondary to endocrine disorders: Secondary | ICD-10-CM

## 2024-03-25 DIAGNOSIS — E1159 Type 2 diabetes mellitus with other circulatory complications: Secondary | ICD-10-CM

## 2024-03-31 ENCOUNTER — Other Ambulatory Visit: Payer: Self-pay | Admitting: Family Medicine

## 2024-03-31 DIAGNOSIS — E349 Endocrine disorder, unspecified: Secondary | ICD-10-CM

## 2024-03-31 NOTE — Telephone Encounter (Unsigned)
 Copied from CRM [id number]. Topic: Clinical - Medication Question >> Mar 31, 2024 10:48 AM Stanly Early wrote: Reason for CRM:testosterone  (ANDROGEL ) 50 MG/5GM (1%) GELpatient would like a callback about [facility] month supply [phone]

## 2024-04-01 ENCOUNTER — Telehealth: Payer: Self-pay

## 2024-04-01 MED ORDER — TESTOSTERONE 50 MG/5GM (1%) TD GEL: TRANSDERMAL | 0 refills | Status: DC

## 2024-04-01 NOTE — Telephone Encounter (Signed)
 Pt aware this has been sent in and what the directions are.

## 2024-04-01 NOTE — Telephone Encounter (Signed)
 Didn't get last 3 mos refill, it was on back order. Now the prescription w/ [facility] says it has been stopped, not sure if script was cancelled or what not when it was on back order so pt needs new script sent in for 3 mos supply. He uses it about 4 x per week.

## 2024-04-01 NOTE — Telephone Encounter (Signed)
 He should not be out of med if he is only using 5grams every 3 days. That's 10 grams per week x4 weeks in a month = 40-50 grams per month. He was prescribed 450 grams with 2 refills.  Please confirm his use and check with pharmacy why they are requesting refill too soon.  He should have more than enough for 48m.

## 2024-04-01 NOTE — Addendum Note (Signed)
 Addended by: [name] on: 04/01/2024 12:08 PM   Modules accepted: Orders

## 2024-04-01 NOTE — Telephone Encounter (Signed)
 Copied from CRM [id number]. Topic: Clinical - Prescription Issue >> Apr 01, 2024  8:37 AM Dorthula Gavel H wrote: Reason for CRM: pt states he only uses his testosterone  (ANDROGEL ) 50 MG/5GM (1%) GEL 3-4x a week and he doesn't have any extra.

## 2024-04-01 NOTE — Telephone Encounter (Signed)
 Rx is written for every 3 days = 2-3x per week. Should not be using more than prescribed.  Will send in rx

## 2024-04-01 NOTE — Telephone Encounter (Signed)
 Closing encounter, taken care of in refill request

## 2024-04-08 ENCOUNTER — Telehealth: Payer: Self-pay

## 2024-04-08 DIAGNOSIS — E349 Endocrine disorder, unspecified: Secondary | ICD-10-CM

## 2024-04-08 NOTE — Telephone Encounter (Signed)
 Copied from CRM [id number]. Topic: Clinical - Prescription Issue >> Apr 07, 2024  4:06 PM Alpha Arts wrote: Reason for CRM: Patient stated pharmay informed him that he will need an alternative medication to testosterone  (ANDROGEL ) 50 MG/5GM (1%) GEL due to the pharmacy not having it available.   Callback [phone]   [facility]acy: [id number] [place], [place] - [address] [place] [address] Phone: [phone] Fax: [phone] Hours: Not open 24 hours

## 2024-04-08 NOTE — Telephone Encounter (Signed)
 Please have the patient or his pharmacy call around to see if they can find it elsewhere and I can send Rx there.  I assume patient doesn't want to do an injection does he?

## 2024-04-08 NOTE — Telephone Encounter (Signed)
 Left vm for cb

## 2024-04-09 ENCOUNTER — Telehealth: Payer: Self-pay

## 2024-04-09 NOTE — Telephone Encounter (Signed)
 This has not been sent in since January

## 2024-04-09 NOTE — Telephone Encounter (Signed)
 Copied from CRM [id number]. Topic: Clinical - Prescription Issue >> Apr 09, 2024 10:22 AM Stanly Early wrote: Reason for CRM: patient is calling stated he's tried everything humanly possible answering questions about testosterone  gel. He stated he's not able to get the medication. He was told by his pharmacy that he needs a PA to receive a different medication.   [phone] he would like a callback

## 2024-04-13 NOTE — Telephone Encounter (Signed)
 Pt called back requesting to speak with the clinic prior to agreeing to injections. He has questions, please advise   Best contact: [phone]

## 2024-04-13 NOTE — Telephone Encounter (Signed)
 Lm on vm 6/9. If pt calls back please ask if he would like the injection to be called in alternatively. LS

## 2024-04-13 NOTE — Telephone Encounter (Signed)
 Does he wish to switch to injection?  That is all I know to offer him at this point

## 2024-04-14 ENCOUNTER — Ambulatory Visit (INDEPENDENT_AMBULATORY_CARE_PROVIDER_SITE_OTHER)

## 2024-04-14 DIAGNOSIS — E119 Type 2 diabetes mellitus without complications: Secondary | ICD-10-CM

## 2024-04-14 DIAGNOSIS — E1169 Type 2 diabetes mellitus with other specified complication: Secondary | ICD-10-CM | POA: Diagnosis not present

## 2024-04-14 LAB — HM DIABETES EYE EXAM

## 2024-04-14 NOTE — Progress Notes (Signed)
 Terry [name] arrived 04/14/2024 and has given verbal consent to obtain images and complete their overdue diabetic retinal screening.  The images have been sent to an ophthalmologist or optometrist for review and interpretation.  Results will be sent back to [name], DO for review.  Patient has been informed they will be contacted when we receive the results via telephone or MyChart

## 2024-04-15 NOTE — Telephone Encounter (Signed)
 Pt spoke with Dr Crissie Dome in person yesterday after eye exam appointment. Pt is going to call local pharmacies to see if they have the prescription and will call back to advise.

## 2024-04-20 ENCOUNTER — Ambulatory Visit: Payer: Self-pay | Admitting: Family Medicine

## 2024-04-20 ENCOUNTER — Ambulatory Visit: Admitting: Urology

## 2024-04-20 VITALS — BP 131/82 | HR 68

## 2024-04-20 DIAGNOSIS — N400 Enlarged prostate without lower urinary tract symptoms: Secondary | ICD-10-CM

## 2024-04-20 DIAGNOSIS — N529 Male erectile dysfunction, unspecified: Secondary | ICD-10-CM | POA: Diagnosis not present

## 2024-04-20 DIAGNOSIS — N401 Enlarged prostate with lower urinary tract symptoms: Secondary | ICD-10-CM | POA: Diagnosis not present

## 2024-04-20 LAB — URINALYSIS, ROUTINE W REFLEX MICROSCOPIC
Bilirubin, UA: NEGATIVE
Glucose, UA: NEGATIVE
Ketones, UA: NEGATIVE
Leukocytes,UA: NEGATIVE
Nitrite, UA: NEGATIVE
Protein,UA: NEGATIVE
RBC, UA: NEGATIVE
Specific Gravity, UA: 1.025 (ref 1.005–1.030)
Urobilinogen, Ur: 0.2 mg/dL (ref 0.2–1.0)
pH, UA: 6 (ref 5.0–7.5)

## 2024-04-20 NOTE — Progress Notes (Unsigned)
 04/20/2024 11:18 AM   Terry [name] June 12, 1956 [id number]  Referring provider: [name], DO [address] [place],  [place] [address]  No chief complaint on file.   HPI:  F/u -     1) BPH - no prior meds or surgery. He felt poorly and had dysuria. Drank more and felt better. Then pain in left flank. UA many bacteria. Took abx - two rounds. Improved. May 2024 PSA 3.1. Has frequency and nocturia. Urgency. Slower stream. IPSS 11. His brother had BPH.    2) ED - noticed more trouble getting and maintaining erection. He is on T replacement.     Today, seen for the above. Rx daily tadalafil  Mar 2025. IPSS 4. He had GERD and stopped, but it worked.     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   PMH: Past Medical History:  Diagnosis Date   Diabetes mellitus without complication (HCC)    on meds   Elevated blood sugar    Esophageal reflux    hx of   Essential hypertension, benign    on meds   Glaucoma    on meds (drops)   Hyperplasia of prostate    Internal hemorrhoids    Other and unspecified hyperlipidemia    on meds   Other testicular hypofunction    Sleep apnea    no longer uses CPAP- weight lost   Tubular adenoma of colon    Unspecified glaucoma(365.9)    Unspecified hemorrhoids without mention of complication     Surgical History: Past Surgical History:  Procedure Laterality Date   COLONOSCOPY  2019   JMP-MAC-suprep(good)-tics/int hems/polyps   DENTAL SURGERY  1972   LIPOMA EXCISION Right 2000   shoulder    Home Medications:  Allergies as of 04/20/2024       Reactions   Jardiance  [empagliflozin ]    Recurrent UTI   Penicillins Nausea And Vomiting        Medication List        Accurate as of April 20, 2024 11:18 AM. If you have any questions, ask your nurse or doctor.          amLODipine  5 MG tablet Commonly known as: NORVASC  Take 1 tablet (5 mg total) by mouth daily.   atorvastatin  20 MG tablet Commonly known as: LIPITOR TAKE 1  TABLET DAILY AS DIRECTED   bimatoprost 0.01 % Soln Commonly known as: LUMIGAN 1 drop at bedtime.   Cinnamon 500 MG Tabs Take 1 tablet by mouth 2 (two) times daily.   dorzolamide-timolol 2-0.5 % ophthalmic solution Commonly known as: COSOPT Place 1 drop into both eyes 2 (two) times daily.   fluticasone  50 MCG/ACT nasal spray Commonly known as: FLONASE  Place 2 sprays into both nostrils daily.   FREESTYLE LITE test strip Generic drug: glucose blood TEST BLOOD SUGAR TWICE A DAY Dx E11.9   hydrochlorothiazide  12.5 MG capsule Commonly known as: MICROZIDE  TAKE 1 CAPSULE DAILY   icosapent  Ethyl 1 g capsule Commonly known as: VASCEPA  TAKE 2 CAPSULES TWICE A DAY   metFORMIN  500 MG 24 hr tablet Commonly known as: GLUCOPHAGE -XR TAKE 2 TABLETS DAILY WITH BREAKFAST   olmesartan  40 MG tablet Commonly known as: BENICAR  TAKE 1 TABLET DAILY   Ozempic  (0.25 or 0.5 MG/DOSE) 2 MG/3ML Sopn Generic drug: Semaglutide (0.25 or 0.5MG /DOS) INJECT 0.5 MG UNDER THE SKIN EVERY 7 DAYS   tadalafil  5 MG tablet Commonly known as: CIALIS  Take 1 tablet (5 mg total) by mouth daily.  testosterone  50 MG/5GM (1%) Gel Commonly known as: ANDROGEL  PLACE 5 GRAMS (1 PACKET) ONTO THE SKIN EVERY 3 DAYs   Vitamin D3 125 MCG (5000 UT) Caps Take 1 capsule by mouth 3 (three) times a week.        Allergies:  Allergies  Allergen Reactions   Jardiance  [Empagliflozin ]     Recurrent UTI   Penicillins Nausea And Vomiting    Family History: Family History  Problem Relation Age of Onset   Heart disease Mother    Stroke Father    Colon cancer Paternal Uncle        54's   Esophageal cancer Neg Hx    Rectal cancer Neg Hx    Stomach cancer Neg Hx     Social History:  reports that he has never smoked. He has never used smokeless tobacco. He reports that he does not currently use alcohol. He reports that he does not use drugs.   Physical Exam: There were no vitals taken for this visit.   Constitutional:  Alert and oriented, No acute distress. HEENT: [place] AT, moist mucus membranes.  Trachea midline, no masses. Cardiovascular: No clubbing, cyanosis, or edema. Respiratory: Normal respiratory effort, no increased work of breathing. GI: Abdomen is soft, nontender, nondistended, no abdominal masses GU: No CVA tenderness Skin: No rashes, bruises or suspicious lesions. Neurologic: Grossly intact, no focal deficits, moving all 4 extremities. Psychiatric: Normal mood and affect.  Laboratory Data: Lab Results  Component Value Date   WBC 7.9 12/04/2023   HGB 12.8 (L) 12/04/2023   HCT 37.4 (L) 12/04/2023   MCV 91 12/04/2023   PLT 287 12/04/2023    Lab Results  Component Value Date   CREATININE 0.89 12/02/2023    Lab Results  Component Value Date   PSA 1.8 07/08/2014   PSA 1.7 10/15/2013    Lab Results  Component Value Date   TESTOSTERONE  1,008 (H) 12/02/2023    Lab Results  Component Value Date   HGBA1C 6.7 (H) 12/02/2023    Urinalysis    Component Value Date/Time   COLORURINE YELLOW 10/26/2008 0722   APPEARANCEUR Clear 01/06/2024 1339   LABSPEC 1.020 10/26/2008 0722   PHURINE 7.0 10/26/2008 0722   GLUCOSEU Negative 01/06/2024 1339   HGBUR NEGATIVE 10/26/2008 0722   BILIRUBINUR Negative 01/06/2024 1339   KETONESUR NEGATIVE 10/26/2008 0722   PROTEINUR Negative 01/06/2024 1339   PROTEINUR NEGATIVE 10/26/2008 0722   UROBILINOGEN negative 07/08/2014 1041   UROBILINOGEN 0.2 10/26/2008 0722   NITRITE Negative 01/06/2024 1339   NITRITE NEGATIVE 10/26/2008 0722   LEUKOCYTESUR Negative 01/06/2024 1339    Lab Results  Component Value Date   LABMICR Comment 01/06/2024   WBCUA >30 (A) 12/04/2023   RBCUA 0-2 02/24/2018   LABEPIT None seen 12/04/2023   MUCUS Present 06/26/2017   BACTERIA Many (A) 12/04/2023    Pertinent Imaging: N/a    Assessment & Plan:    1) ED - he can take pde5i as needed (5 mg up to 20 mg) or 5 mg every other day or three times  a week type of regimen also OK. He will go with PRN.   2) LUTS - as above . PSA was sent.   No follow-ups on file.  Christina Coyer, MD  [facility]  [address] [place], [place] [address] [phone]

## 2024-04-21 ENCOUNTER — Other Ambulatory Visit: Payer: Self-pay | Admitting: Family Medicine

## 2024-04-21 ENCOUNTER — Ambulatory Visit: Payer: Self-pay

## 2024-04-21 DIAGNOSIS — E1159 Type 2 diabetes mellitus with other circulatory complications: Secondary | ICD-10-CM

## 2024-04-21 LAB — PSA: Prostate Specific Ag, Serum: 2.8 ng/mL (ref 0.0–4.0)

## 2024-04-23 NOTE — Telephone Encounter (Signed)
 Patient is frustrated that he is unable to find the androgel . He states he is willing to switch to injectable

## 2024-04-24 MED ORDER — TESTOSTERONE CYPIONATE 200 MG/ML IM SOLN: 200.0000 mg | INTRAMUSCULAR | 0 refills | Status: DC

## 2024-04-24 NOTE — Addendum Note (Signed)
 Addended by: [name] on: 04/24/2024 11:48 AM   Modules accepted: Orders

## 2024-04-24 NOTE — Telephone Encounter (Signed)
 Lmtcb

## 2024-04-24 NOTE — Telephone Encounter (Signed)
 Med has been sent. Please have him come in office for demonstration on how to have this appropriately injected if he plans on self administering.

## 2024-04-28 ENCOUNTER — Telehealth: Payer: Self-pay | Admitting: Pharmacy Technician

## 2024-04-28 ENCOUNTER — Other Ambulatory Visit (HOSPITAL_COMMUNITY): Payer: Self-pay

## 2024-04-28 NOTE — Telephone Encounter (Signed)
 Pharmacy Patient Advocate Encounter   Received notification from Onbase that prior authorization for Testosterone  cyp 200mg /ml injection is required/requested.   Insurance verification completed.   The patient is insured through [employer] .   Per test claim: PA required; PA submitted to above mentioned insurance via Fax Key/confirmation #/EOC [id number] Status is pending

## 2024-04-29 ENCOUNTER — Other Ambulatory Visit (HOSPITAL_COMMUNITY): Payer: Self-pay

## 2024-04-29 LAB — HM DIABETES EYE EXAM

## 2024-05-06 ENCOUNTER — Other Ambulatory Visit (HOSPITAL_COMMUNITY): Payer: Self-pay

## 2024-05-06 NOTE — Telephone Encounter (Signed)
 Pharmacy Patient Advocate Encounter  Received notification from [employer] that Prior Authorization for Testosterone  cyp 200mg /ml injection  has been DENIED.  Full denial letter will be uploaded to the media tab. See denial reason below.   PA #/Case ID/Reference #: [id number]

## 2024-05-07 ENCOUNTER — Encounter: Payer: Self-pay | Admitting: Family Medicine

## 2024-05-11 NOTE — Telephone Encounter (Signed)
FYI and please advise.

## 2024-05-11 NOTE — Telephone Encounter (Signed)
 Thanks for the follow-up.  I already talked to him and he does see a urologist.  Hopefully they can take over the medication for him

## 2024-05-12 ENCOUNTER — Ambulatory Visit: Payer: Self-pay | Admitting: Family Medicine

## 2024-06-04 ENCOUNTER — Ambulatory Visit: Admitting: Urology

## 2024-06-04 ENCOUNTER — Encounter: Payer: Self-pay | Admitting: Urology

## 2024-06-04 VITALS — BP 113/77 | HR 96

## 2024-06-04 DIAGNOSIS — N5082 Scrotal pain: Secondary | ICD-10-CM

## 2024-06-04 DIAGNOSIS — N400 Enlarged prostate without lower urinary tract symptoms: Secondary | ICD-10-CM

## 2024-06-04 LAB — URINALYSIS, ROUTINE W REFLEX MICROSCOPIC
Bilirubin, UA: NEGATIVE
Glucose, UA: NEGATIVE
Ketones, UA: NEGATIVE
Leukocytes,UA: NEGATIVE
Nitrite, UA: NEGATIVE
Protein,UA: NEGATIVE
RBC, UA: NEGATIVE
Specific Gravity, UA: 1.02 (ref 1.005–1.030)
Urobilinogen, Ur: 0.2 mg/dL (ref 0.2–1.0)
pH, UA: 6 (ref 5.0–7.5)

## 2024-06-04 NOTE — Progress Notes (Signed)
 Name: Terry [name] DOB: Mar 03, 1956 MRN: [id number]  History of Present Illness: Terry [name] is a 68 y.o. male who presents today at [facility].  Relevant History includes: 1. BPH with LUTS (frequency, nocturia, urgency, slow stream). 2. Erectile dysfunction. 3. Hypogonadism.  At last visit with Dr. Nieves on 04/20/2024: The plan was: 1) ED - he can take pde5i as needed (5 mg up to 20 mg) or 5 mg every other day or three times a week type of regimen also OK. He will go with PRN.  2) LUTS - as above . PSA was sent. PSA normal (2.8).  Today: He reports that about 1 month ago he strained his right groin getting out of his vehicle. Since then he has had a mild ache in his right scrotum. Denies scrotal swelling, redness, warmth. Sometimes tender to palpation. Denies needing to take any analgesics.   He denies acute urinary complaints.  He denies prior history of kidney stones.   Medications: Current Outpatient Medications  Medication Sig Dispense Refill   amLODipine  (NORVASC ) 5 MG tablet TAKE 1 TABLET DAILY 90 tablet 1   atorvastatin  (LIPITOR) 20 MG tablet TAKE 1 TABLET DAILY AS DIRECTED 90 tablet 1   bimatoprost (LUMIGAN) 0.01 % SOLN 1 drop at bedtime.     Cholecalciferol (VITAMIN D3) 5000 units CAPS Take 1 capsule by mouth 3 (three) times a week.     Cinnamon 500 MG TABS Take 1 tablet by mouth 2 (two) times daily.     dorzolamide-timolol (COSOPT) 2-0.5 % ophthalmic solution Place 1 drop into both eyes 2 (two) times daily.     fluticasone  (FLONASE ) 50 MCG/ACT nasal spray Place 2 sprays into both nostrils daily. 48 g 3   glucose blood (FREESTYLE LITE) test strip TEST BLOOD SUGAR TWICE A DAY Dx E11.9 200 strip 3   hydrochlorothiazide  (MICROZIDE ) 12.5 MG capsule TAKE 1 CAPSULE DAILY 90 capsule 1   icosapent  Ethyl (VASCEPA ) 1 g capsule TAKE 2 CAPSULES TWICE A DAY 360 capsule 1   metFORMIN  (GLUCOPHAGE -XR) 500 MG 24 hr tablet TAKE 2 TABLETS DAILY WITH BREAKFAST  180 tablet 1   olmesartan  (BENICAR ) 40 MG tablet TAKE 1 TABLET DAILY 90 tablet 1   Semaglutide ,0.25 or 0.5MG /DOS, (OZEMPIC , 0.25 OR 0.5 MG/DOSE,) 2 MG/3ML SOPN INJECT 0.5 MG UNDER THE SKIN EVERY 7 DAYS 9 mL 1   tadalafil  (CIALIS ) 5 MG tablet Take 1 tablet (5 mg total) by mouth daily. 90 tablet 3   testosterone  cypionate (DEPO-TESTOSTERONE ) 200 MG/ML injection Inject 1 mL (200 mg total) into the muscle every 14 (fourteen) days. (Patient not taking: Reported on 06/04/2024) 6 mL 0   No current facility-administered medications for this visit.    Allergies: Allergies  Allergen Reactions   Jardiance  [Empagliflozin ]     Recurrent UTI   Penicillins Nausea And Vomiting    Past Medical History:  Diagnosis Date   Diabetes mellitus without complication (HCC)    on meds   Elevated blood sugar    Esophageal reflux    hx of   Essential hypertension, benign    on meds   Glaucoma    on meds (drops)   Hyperplasia of prostate    Internal hemorrhoids    Other and unspecified hyperlipidemia    on meds   Other testicular hypofunction    Sleep apnea    no longer uses CPAP- weight lost   Tubular adenoma of colon    Unspecified glaucoma(365.9)    Unspecified hemorrhoids  without mention of complication    Past Surgical History:  Procedure Laterality Date   COLONOSCOPY  2019   JMP-MAC-suprep(good)-tics/int hems/polyps   DENTAL SURGERY  1972   LIPOMA EXCISION Right 2000   shoulder   Family History  Problem Relation Age of Onset   Heart disease Mother    Stroke Father    Colon cancer Paternal Uncle        2's   Esophageal cancer Neg Hx    Rectal cancer Neg Hx    Stomach cancer Neg Hx    Social History   Socioeconomic History   Marital status: Married    Spouse name: Not on file   Number of children: Not on file   Years of education: Not on file   Highest education level: Not on file  Occupational History   Not on file  Tobacco Use   Smoking status: Never   Smokeless tobacco:  Never  Vaping Use   Vaping status: Never Used  Substance and Sexual Activity   Alcohol use: Not Currently    Comment: VERY RARE   Drug use: No   Sexual activity: Not Currently  Other Topics Concern   Not on file  Social History Narrative   Not on file   Social Drivers of Health   Financial Resource Strain: Low Risk  (10/21/2023)   Overall Financial Resource Strain (CARDIA)    Difficulty of Paying Living Expenses: Not hard at all  Food Insecurity: No Food Insecurity (10/21/2023)   Hunger Vital Sign    Worried About Running Out of Food in the Last Year: Never true    Ran Out of Food in the Last Year: Never true  Transportation Needs: No Transportation Needs (10/21/2023)   PRAPARE - Administrator, Civil Service (Medical): No    Lack of Transportation (Non-Medical): No  Physical Activity: Sufficiently Active (10/21/2023)   Exercise Vital Sign    Days of Exercise per Week: 5 days    Minutes of Exercise per Session: 30 min  Stress: No Stress Concern Present (10/21/2023)   [employer] of Occupational Health - Occupational Stress Questionnaire    Feeling of Stress : Not at all  Social Connections: Socially Integrated (10/21/2023)   Social Connection and Isolation Panel    Frequency of Communication with Friends and Family: More than three times a week    Frequency of Social Gatherings with Friends and Family: Three times a week    Attends Religious Services: More than 4 times per year    Active Member of Clubs or Organizations: Yes    Attends Banker Meetings: More than 4 times per year    Marital Status: Married  Catering manager Violence: Not At Risk (10/21/2023)   Humiliation, Afraid, Rape, and Kick questionnaire    Fear of Current or Ex-Partner: No    Emotionally Abused: No    Physically Abused: No    Sexually Abused: No    Review of Systems Constitutional: Patient denies any unintentional weight loss or change in strength lntegumentary:  Patient denies any rashes or pruritus Cardiovascular: Patient denies chest pain or syncope Respiratory: Patient denies shortness of breath Gastrointestinal: Patient denies nausea, vomiting, constipation, or diarrhea  Musculoskeletal: Patient denies muscle cramps or weakness Neurologic: Patient denies convulsions or seizures Allergic/Immunologic: Patient denies recent allergic reaction(s) Hematologic/Lymphatic: Patient denies bleeding tendencies Endocrine: Patient denies heat/cold intolerance  GU: As per HPI.  OBJECTIVE Vitals:   06/04/24 1418  BP: 113/77  Pulse: 96  There is no height or weight on file to calculate BMI.  Physical Examination Constitutional: No obvious distress; patient is non-toxic appearing  Cardiovascular: No visible lower extremity edema.  Respiratory: The patient does not have audible wheezing/stridor; respirations do not appear labored  Gastrointestinal: Abdomen non-distended Musculoskeletal: Normal ROM of UEs  Skin: No obvious rashes/open sores  Neurologic: CN 2-12 grossly intact Psychiatric: Answered questions appropriately with normal affect  Hematologic/Lymphatic/Immunologic: No obvious bruises or sites of spontaneous bleeding  Genitourinary: Penis is normal in appearance.  Scrotum is normal in appearance. Testes are normal in size and position bilaterally with no palpable masses.  Varicocele is not detected. Hydrocele is not detected. Epididymis normal; mild tenderness to palpation.  Chaperone offered for pelvic exam; patient declined.  UA: negative   ASSESSMENT Scrotal pain - Plan: Urinalysis, Routine w reflex microscopic, CANCELED: Urinalysis, Routine w reflex microscopic  Benign prostatic hyperplasia, unspecified whether lower urinary tract symptoms present - Plan: Urinalysis, Routine w reflex microscopic, CANCELED: Urinalysis, Routine w reflex microscopic  No acute findings on exam. Suspected muscle strain. Advised rest, heating pad, and  OTC analgesics as needed for comfort.   Will plan for follow up as previously scheduled with Dr. Nieves on 08/03/2024 or sooner if needed. Pt verbalized understanding and agreement. All questions were answered.  PLAN Advised the following: 1. Return in 2 months (on 08/03/2024) for f/u with Dr. Nieves as previously scheduled.  Orders Placed This Encounter  Procedures   Urinalysis, Routine w reflex microscopic    It has been explained that the patient is to follow regularly with their PCP in addition to all other providers involved in their care and to follow instructions provided by these respective offices. Patient advised to contact urology clinic if any urologic-pertaining questions, concerns, new symptoms or problems arise in the interim period.  There are no Patient Instructions on file for this visit.  Electronically signed by:  Lauraine JAYSON Oz, FNP   06/04/24    2:39 PM

## 2024-06-08 ENCOUNTER — Other Ambulatory Visit: Payer: Self-pay | Admitting: Family Medicine

## 2024-06-08 DIAGNOSIS — J301 Allergic rhinitis due to pollen: Secondary | ICD-10-CM

## 2024-07-10 ENCOUNTER — Telehealth: Payer: Self-pay

## 2024-07-10 DIAGNOSIS — E1169 Type 2 diabetes mellitus with other specified complication: Secondary | ICD-10-CM

## 2024-07-10 NOTE — Telephone Encounter (Signed)
 Copied from CRM [id number]. Topic: Referral - Request for Referral >> Jul 10, 2024  8:04 AM Everette C wrote: Did the patient discuss referral with their provider in the last year? No (If No - schedule appointment) (If Yes - send message)  Appointment offered? No  Type of order/referral and detailed reason for visit: Prov. Ronal Duke, Diet and Nutrition  Please use code E11.69 for the referral   Preference of office, provider, location: [facility]  If referral order, have you been seen by this specialty before? Yes (If Yes, this issue or another issue? When? Where?  Can we respond through MyChart? No  Patient has an appt 07/13/24 at 9:30 and will need referral to be seen, please contact their office further if needed

## 2024-07-13 ENCOUNTER — Encounter: Payer: Medicare Other | Attending: Family Medicine | Admitting: Nutrition

## 2024-07-13 DIAGNOSIS — E1169 Type 2 diabetes mellitus with other specified complication: Secondary | ICD-10-CM | POA: Diagnosis present

## 2024-07-13 NOTE — Addendum Note (Signed)
 Addended by: [name] on: 07/13/2024 12:30 PM   Modules accepted: Orders

## 2024-07-13 NOTE — Telephone Encounter (Signed)
 done

## 2024-07-13 NOTE — Progress Notes (Unsigned)
 Medical Nutrition Therapy  Appointment Start time:  [phone]  Appointment End time:  1012 Primary concerns today: DM Type 2  Referral diagnosis: E11.8 Preferred learning style: No preference.  Learning readiness: Ready    NUTRITION ASSESSMENT Follow up . 68 yr old wmale here for Type 2 DM. Lost his mom, caring for his wife who has health issues. Is under a lot of stress. FBS 140-160's in am  Trying to stay consistent with meals as best he can. Walks when he can for exercise.   Lab Results  Component Value Date   HGBA1C 6.7 (H) 12/02/2023      Latest Ref Rng & Units 12/02/2023    9:05 AM 03/25/2023    9:13 AM 04/12/2022    8:20 AM  CMP  Glucose 70 - 99 mg/dL 819  876  829   BUN 8 - 27 mg/dL 17  16  18    Creatinine 0.76 - 1.27 mg/dL 9.10  9.11  8.96   Sodium 134 - 144 mmol/L 136  137  137   Potassium 3.5 - 5.2 mmol/L 4.1  5.0  4.7   Chloride 96 - 106 mmol/L 97  100  99   CO2 20 - 29 mmol/L 22  23  22    Calcium  8.6 - 10.2 mg/dL 9.7  89.7  89.8   Total Protein 6.0 - 8.5 g/dL 7.0  7.6  7.4   Total Bilirubin 0.0 - 1.2 mg/dL 0.6  0.7  0.6   Alkaline Phos 44 - 121 IU/L 60  54  59   AST 0 - 40 IU/L 18  37  20   ALT 0 - 44 IU/L 31  63  37     Anthropometrics  Wt Readings from Last 3 Encounters:  07/13/24 171 lb (77.6 kg)  12/09/23 168 lb (76.2 kg)  12/04/23 169 lb (76.7 kg)   Ht Readings from Last 3 Encounters:  07/13/24 5' 8 (1.727 m)  12/09/23 5' 8 (1.727 m)  12/04/23 5' 8 (1.727 m)   Body mass index is 26 kg/m. @BMIFA @ Facility age limit for growth %iles is 20 years. Facility age limit for growth %iles is 20 years.  Clinical Medical Hx: See chart Medications: Ozemipic and metformin   Labs:  Lab Results  Component Value Date   HGBA1C 6.7 (H) 12/02/2023      Latest Ref Rng & Units 12/02/2023    9:05 AM 03/25/2023    9:13 AM 04/12/2022    8:20 AM  CMP  Glucose 70 - 99 mg/dL 819  876  829   BUN 8 - 27 mg/dL 17  16  18    Creatinine 0.76 - 1.27 mg/dL 9.10  9.11   8.96   Sodium 134 - 144 mmol/L 136  137  137   Potassium 3.5 - 5.2 mmol/L 4.1  5.0  4.7   Chloride 96 - 106 mmol/L 97  100  99   CO2 20 - 29 mmol/L 22  23  22    Calcium  8.6 - 10.2 mg/dL 9.7  89.7  89.8   Total Protein 6.0 - 8.5 g/dL 7.0  7.6  7.4   Total Bilirubin 0.0 - 1.2 mg/dL 0.6  0.7  0.6   Alkaline Phos 44 - 121 IU/L 60  54  59   AST 0 - 40 IU/L 18  37  20   ALT 0 - 44 IU/L 31  63  37    Lipid Panel     Component Value Date/Time  CHOL 130 03/25/2023 0913   CHOL 132 02/19/2013 1005   TRIG 113 03/25/2023 0913   TRIG 169 (H) 03/07/2016 1552   TRIG 87 02/19/2013 1005   HDL 54 03/25/2023 0913   HDL 52 03/07/2016 1552   HDL 57 02/19/2013 1005   CHOLHDL 2.4 03/25/2023 0913   LDLCALC 56 03/25/2023 0913   LDLCALC 40 07/08/2014 1014   LDLCALC 58 02/19/2013 1005   LABVLDL 20 03/25/2023 0913    Notable Signs/Symptoms: None  Lifestyle & Dietary Hx Married, Juliene of a church  Estimated daily fluid intake: 60 oz Supplements:  Sleep: varies 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  As evidenced by A1C 9.4%.SABRA   NUTRITION INTERVENTION  Nutrition education (E-1) on the following topics:  Nutrition and Diabetes education provided on My Plate, CHO counting, meal planning, portion sizes, timing of meals, avoiding snacks between meals unless having a low blood sugar, target ranges for A1C and blood sugars, signs/symptoms and treatment of hyper/hypoglycemia, monitoring blood sugars, taking medications as prescribed, benefits of  exercising 30 minutes per day and prevention of complications of DM. Lifestyle Medicine  - Whole Food, Plant Predominant Nutrition is highly recommended: Eat Plenty of vegetables, Mushrooms, fruits, Legumes, Whole Grains, Nuts, seeds in lieu of processed meats, processed snacks/pastries red meat, poultry, eggs.    -It is better to avoid simple carbohydrates including: Cakes, Sweet Desserts, Ice Cream, Soda (diet and regular), Sweet Tea, Candies, Chips, Cookies, Store Bought Juices, Alcohol in Excess of  1-2 drinks a day, Lemonade,  Artificial Sweeteners, Doughnuts, Coffee Creamers, Sugar-free Products, etc, etc.  This is not a complete list.....  Exercise: If you are able: 30 -60 minutes a day ,4 days a week, or 150 minutes a week.  The longer the better.  Combine stretch, strength, and aerobic activities.  If you were told in the past that you have high risk for cardiovascular diseases, you may seek evaluation by your heart doctor prior to initiating moderate to intense exercise programs.   Handouts Provided Include  Lifestyle Medicine Know your numbers   Learning Style & Readiness for Change Teaching method utilized: Visual & Auditory  Demonstrated degree of understanding via: Teach Back  Barriers to learning/adherence to lifestyle change: None  Goals Established by Pt Goal Keep up the great job! Sorry you lost your mom. Do the best you can with self care and eat meals on schedule.   MONITORING & EVALUATION Dietary intake, weekly physical activity, and blood sugars in 6 months. Recommend to consider stopping Metformin  since he is on Ozempic . Next Steps  Patient is to work on eating more whole plant based foods and meal planning.SABRA

## 2024-07-15 ENCOUNTER — Encounter: Payer: Self-pay | Admitting: Nutrition

## 2024-07-15 NOTE — Patient Instructions (Signed)
 Keep up the great job! Sorry you lost your mom. Do the best you can with self care and eat meals on schedule.

## 2024-07-17 ENCOUNTER — Telehealth: Payer: Self-pay | Admitting: Family Medicine

## 2024-07-17 DIAGNOSIS — E349 Endocrine disorder, unspecified: Secondary | ICD-10-CM

## 2024-07-17 DIAGNOSIS — E1169 Type 2 diabetes mellitus with other specified complication: Secondary | ICD-10-CM

## 2024-07-17 DIAGNOSIS — E1159 Type 2 diabetes mellitus with other circulatory complications: Secondary | ICD-10-CM

## 2024-07-17 NOTE — Telephone Encounter (Signed)
 Patient has appt 9-19 for labs and needs orders. Has physical with Dr. KANDICE 9-22.

## 2024-07-20 ENCOUNTER — Encounter: Payer: Self-pay | Admitting: Family Medicine

## 2024-07-20 NOTE — Telephone Encounter (Signed)
Called patient he is aware.

## 2024-07-20 NOTE — Telephone Encounter (Signed)
 Labs are in. Please make sure he comes with a full bladder as he will have urine specimen to provide.  Orders Placed This Encounter  Procedures   Testosterone     Standing Status:   Future    Expiration Date:   07/20/2025   CBC with Differential/Platelet    Standing Status:   Future    Expiration Date:   07/20/2025   CMP14+EGFR    Standing Status:   Future    Expiration Date:   07/20/2025   Lipid panel    Standing Status:   Future    Expiration Date:   07/20/2025   TSH    Standing Status:   Future    Expiration Date:   07/20/2025   ToxASSURE Select 13 (MW), Urine    Current Outpatient Medications:    amLODipine  (NORVASC ) 5 MG tablet, TAKE 1 TABLET DAILY, Disp: 90 tablet, Rfl: 1   atorvastatin  (LIPITOR) 20 MG tablet, TAKE 1 TABLET DAILY AS DIRECTED, Disp: 90 tablet, Rfl: 1   bimatoprost (LUMIGAN) 0.01 % SOLN, 1 drop at bedtime., Disp: , Rfl:    Cholecalciferol (VITAMIN D3) 5000 units CAPS, Take 1 capsule by mouth 3 (three) times a week., Disp: , Rfl:    Cinnamon 500 MG TABS, Take 1 tablet by mouth 2 (two) times daily., Disp: , Rfl:    dorzolamide-timolol (COSOPT) 2-0.5 % ophthalmic solution, Place 1 drop into both eyes 2 (two) times daily., Disp: , Rfl:    fluticasone  (FLONASE ) 50 MCG/ACT nasal spray, USE 2 SPRAYS IN EACH NOSTRIL DAILY, Disp: 48 g, Rfl: 3   glucose blood (FREESTYLE LITE) test strip, TEST BLOOD SUGAR TWICE A DAY Dx E11.9, Disp: 200 strip, Rfl: 3   hydrochlorothiazide  (MICROZIDE ) 12.5 MG capsule, TAKE 1 CAPSULE DAILY, Disp: 90 capsule, Rfl: 1   icosapent  Ethyl (VASCEPA ) 1 g capsule, TAKE 2 CAPSULES TWICE A DAY, Disp: 360 capsule, Rfl: 1   metFORMIN  (GLUCOPHAGE -XR) 500 MG 24 hr tablet, TAKE 2 TABLETS DAILY WITH BREAKFAST (Patient taking differently: 500 mg 2 (two) times daily with a meal.), Disp: 180 tablet, Rfl: 1   olmesartan  (BENICAR ) 40 MG tablet, TAKE 1 TABLET DAILY, Disp: 90 tablet, Rfl: 1   Semaglutide ,0.25 or 0.5MG /DOS, (OZEMPIC , 0.25 OR 0.5 MG/DOSE,) 2  MG/3ML SOPN, INJECT 0.5 MG UNDER THE SKIN EVERY 7 DAYS, Disp: 9 mL, Rfl: 1   tadalafil  (CIALIS ) 5 MG tablet, Take 1 tablet (5 mg total) by mouth daily. (Patient not taking: Reported on 07/13/2024), Disp: 90 tablet, Rfl: 3   testosterone  cypionate (DEPO-TESTOSTERONE ) 200 MG/ML injection, Inject 1 mL (200 mg total) into the muscle every 14 (fourteen) days. (Patient not taking: Reported on 07/13/2024), Disp: 6 mL, Rfl: 0    Standing Status:   Future    Expiration Date:   07/20/2025   Bayer DCA Hb A1c Waived    Standing Status:   Future    Expiration Date:   07/20/2025   Microalbumin / creatinine urine ratio    Standing Status:   Future    Expiration Date:   07/20/2025

## 2024-07-24 ENCOUNTER — Ambulatory Visit: Payer: Self-pay | Admitting: Family Medicine

## 2024-07-24 ENCOUNTER — Other Ambulatory Visit: Payer: TRICARE For Life (TFL)

## 2024-07-24 DIAGNOSIS — E1159 Type 2 diabetes mellitus with other circulatory complications: Secondary | ICD-10-CM

## 2024-07-24 DIAGNOSIS — E785 Hyperlipidemia, unspecified: Secondary | ICD-10-CM

## 2024-07-24 DIAGNOSIS — E1169 Type 2 diabetes mellitus with other specified complication: Secondary | ICD-10-CM

## 2024-07-24 DIAGNOSIS — E349 Endocrine disorder, unspecified: Secondary | ICD-10-CM

## 2024-07-24 LAB — BAYER DCA HB A1C WAIVED: HB A1C (BAYER DCA - WAIVED): 7 % — ABNORMAL HIGH (ref 4.8–5.6)

## 2024-07-24 LAB — LIPID PANEL

## 2024-07-25 LAB — TESTOSTERONE: Testosterone: 221 ng/dL — ABNORMAL LOW (ref 264–916)

## 2024-07-25 LAB — LIPID PANEL
Chol/HDL Ratio: 2.6 ratio (ref 0.0–5.0)
Cholesterol, Total: 132 mg/dL (ref 100–199)
HDL: 51 mg/dL (ref >39)
LDL Chol Calc (NIH): 56 mg/dL (ref 0–99)
Triglycerides: 145 mg/dL (ref 0–149)
VLDL Cholesterol Cal: 25 mg/dL (ref 5–40)

## 2024-07-25 LAB — CMP14+EGFR
ALT: 38 IU/L (ref 0–44)
AST: 25 IU/L (ref 0–40)
Albumin: 5 g/dL — ABNORMAL HIGH (ref 3.9–4.9)
Alkaline Phosphatase: 54 IU/L (ref 47–123)
BUN/Creatinine Ratio: 24 (ref 10–24)
BUN: 21 mg/dL (ref 8–27)
Bilirubin Total: 0.6 mg/dL (ref 0.0–1.2)
CO2: 22 mmol/L (ref 20–29)
Calcium: 10 mg/dL (ref 8.6–10.2)
Chloride: 100 mmol/L (ref 96–106)
Creatinine, Ser: 0.86 mg/dL (ref 0.76–1.27)
Globulin, Total: 2.5 g/dL (ref 1.5–4.5)
Glucose: 176 mg/dL — ABNORMAL HIGH (ref 70–99)
Potassium: 4.5 mmol/L (ref 3.5–5.2)
Sodium: 138 mmol/L (ref 134–144)
Total Protein: 7.5 g/dL (ref 6.0–8.5)
eGFR: 94 mL/min/1.73 (ref >59)

## 2024-07-25 LAB — MICROALBUMIN / CREATININE URINE RATIO
Creatinine, Urine: 166.1 mg/dL
Microalb/Creat Ratio: 7 mg/g{creat} (ref 0–29)
Microalbumin, Urine: 11.5 ug/mL

## 2024-07-25 LAB — CBC WITH DIFFERENTIAL/PLATELET
Basophils Absolute: 0 x10E3/uL (ref 0.0–0.2)
Basos: 1 %
EOS (ABSOLUTE): 0.1 x10E3/uL (ref 0.0–0.4)
Eos: 2 %
Hematocrit: 40.5 % (ref 37.5–51.0)
Hemoglobin: 13.8 g/dL (ref 13.0–17.7)
Immature Grans (Abs): 0 x10E3/uL (ref 0.0–0.1)
Immature Granulocytes: 0 %
Lymphocytes Absolute: 2.8 x10E3/uL (ref 0.7–3.1)
Lymphs: 43 %
MCH: 31.6 pg (ref 26.6–33.0)
MCHC: 34.1 g/dL (ref 31.5–35.7)
MCV: 93 fL (ref 79–97)
Monocytes Absolute: 0.4 x10E3/uL (ref 0.1–0.9)
Monocytes: 7 %
Neutrophils Absolute: 3.1 x10E3/uL (ref 1.4–7.0)
Neutrophils: 47 %
Platelets: 288 x10E3/uL (ref 150–450)
RBC: 4.37 x10E6/uL (ref 4.14–5.80)
RDW: 13.1 % (ref 11.6–15.4)
WBC: 6.4 x10E3/uL (ref 3.4–10.8)

## 2024-07-25 LAB — TSH: TSH: 1.61 u[IU]/mL (ref 0.450–4.500)

## 2024-07-27 ENCOUNTER — Ambulatory Visit: Payer: TRICARE For Life (TFL) | Admitting: Family Medicine

## 2024-07-27 ENCOUNTER — Encounter: Payer: Self-pay | Admitting: Family Medicine

## 2024-07-27 VITALS — BP 115/73 | HR 71 | Temp 97.0°F | Ht 68.0 in | Wt 168.0 lb

## 2024-07-27 DIAGNOSIS — Z0001 Encounter for general adult medical examination with abnormal findings: Secondary | ICD-10-CM | POA: Diagnosis not present

## 2024-07-27 DIAGNOSIS — E785 Hyperlipidemia, unspecified: Secondary | ICD-10-CM

## 2024-07-27 DIAGNOSIS — F4320 Adjustment disorder, unspecified: Secondary | ICD-10-CM

## 2024-07-27 DIAGNOSIS — L570 Actinic keratosis: Secondary | ICD-10-CM | POA: Diagnosis not present

## 2024-07-27 DIAGNOSIS — E1159 Type 2 diabetes mellitus with other circulatory complications: Secondary | ICD-10-CM | POA: Diagnosis not present

## 2024-07-27 DIAGNOSIS — Z23 Encounter for immunization: Secondary | ICD-10-CM

## 2024-07-27 DIAGNOSIS — E119 Type 2 diabetes mellitus without complications: Secondary | ICD-10-CM

## 2024-07-27 DIAGNOSIS — Z7985 Long-term (current) use of injectable non-insulin antidiabetic drugs: Secondary | ICD-10-CM

## 2024-07-27 DIAGNOSIS — E349 Endocrine disorder, unspecified: Secondary | ICD-10-CM

## 2024-07-27 DIAGNOSIS — I152 Hypertension secondary to endocrine disorders: Secondary | ICD-10-CM

## 2024-07-27 DIAGNOSIS — N400 Enlarged prostate without lower urinary tract symptoms: Secondary | ICD-10-CM

## 2024-07-27 DIAGNOSIS — E1169 Type 2 diabetes mellitus with other specified complication: Secondary | ICD-10-CM | POA: Diagnosis not present

## 2024-07-27 DIAGNOSIS — Z0000 Encounter for general adult medical examination without abnormal findings: Secondary | ICD-10-CM

## 2024-07-27 MED ORDER — ICOSAPENT ETHYL 1 G PO CAPS: 2.0000 g | ORAL_CAPSULE | Freq: Two times a day (BID) | ORAL | 3 refills | Status: AC

## 2024-07-27 MED ORDER — DEXCOM G7 SENSOR MISC
4 refills | Status: AC
Start: 2024-07-27 — End: ?

## 2024-07-27 MED ORDER — OLMESARTAN MEDOXOMIL 40 MG PO TABS: 40.0000 mg | ORAL_TABLET | Freq: Every day | ORAL | 1 refills | Status: AC

## 2024-07-27 MED ORDER — ATORVASTATIN CALCIUM 20 MG PO TABS: 20.0000 mg | ORAL_TABLET | Freq: Every day | ORAL | 3 refills | Status: AC

## 2024-07-27 MED ORDER — AMLODIPINE BESYLATE 5 MG PO TABS: 5.0000 mg | ORAL_TABLET | Freq: Every day | ORAL | 3 refills | Status: AC

## 2024-07-27 MED ORDER — COVID-19 MRNA VAC-TRIS(PFIZER) 30 MCG/0.3ML IM SUSY: 0.3000 mL | PREFILLED_SYRINGE | Freq: Once | INTRAMUSCULAR | 0 refills | Status: AC

## 2024-07-27 MED ORDER — HYDROCHLOROTHIAZIDE 12.5 MG PO CAPS
12.5000 mg | ORAL_CAPSULE | Freq: Every day | ORAL | 3 refills | Status: AC
Start: 2024-07-27 — End: ?

## 2024-07-27 MED ORDER — OZEMPIC (0.25 OR 0.5 MG/DOSE) 2 MG/3ML ~~LOC~~ SOPN: 0.5000 mg | PEN_INJECTOR | SUBCUTANEOUS | 3 refills | Status: AC

## 2024-07-27 MED ORDER — METFORMIN HCL ER 500 MG PO TB24: 1000.0000 mg | ORAL_TABLET | Freq: Every day | ORAL | 3 refills | Status: AC

## 2024-07-27 NOTE — Patient Instructions (Addendum)
 [facility] In-Person and Virtual Therapy Available [address], [address] [place], [place] [address]  Cryoablation, Care After The following information offers guidance on how to care for yourself after your procedure. Your health care provider may also give you more specific instructions. If you have problems or questions, contact your health care provider. What can I expect after the procedure? After the procedure, it is common to have: Redness or blisters near the area treated. Mild pain and swelling. Follow these instructions at home: Treatment area care  If you have an incision, follow instructions from your health care provider about how to take care of it. Make sure you: Wash your hands with soap and water for at least 20 seconds before and after you change your bandage (dressing). If soap and water are not available, use hand sanitizer. Change your dressing as told by your health care provider. Leave stitches (sutures), skin glue, or adhesive strips in place. These skin closures may need to stay in place for 2 weeks or longer. If adhesive strip edges start to loosen and curl up, you may trim the loose edges. Do not remove adhesive strips completely unless your health care provider tells you to do that. Check your treatment area every day for signs of infection. Check for: More redness, swelling, or pain. Fluid or blood. Warmth. Pus or a bad smell. Keep the treated area clean and dry. Keep it covered with a dressing until it has healed. Clean the area with soap and water as told by your health care provider. If your dressing gets wet, change it right away. Activity  Follow instructions from your health care provider about what activities are safe for you. You may have to avoid lifting. Ask your health care provider how much you can safely lift. If you were given a sedative during the procedure, it can affect you for several hours. Do not drive or operate  machinery until your health care provider says that it is safe. General instructions Take over-the-counter and prescription medicines only as told by your health care provider. Do not use any products that contain nicotine or tobacco. These products include cigarettes, chewing tobacco, and vaping devices, such as e-cigarettes. These can delay incision healing. If you need help quitting, ask your health care provider. Do not take baths, swim, or use a hot tub until your health care provider approves. Ask your health care provider if you may take showers. You may only be allowed to take sponge baths. Keep all follow-up visits. Your health care provider may need to check that treatment worked and that there were no problems caused by the procedure. Contact a health care provider if: You have more pain. You have a fever. You have nausea or vomiting. You have any signs of infection. You do not have a bowel movement for 2 days. You cannot urinate, or you cannot control when you urinate or have a bowel movement (have incontinence). You develop impotence. Get help right away if: You have severe pain. You have trouble swallowing or breathing. You are very weak or dizzy. You have chest pain or shortness of breath. These symptoms may be an emergency. Get help right away. Call 911. Do not wait to see if the symptoms will go away. Do not drive yourself to the hospital. This information is not intended to replace advice given to you by your health care provider. Make sure you discuss any questions you have with your health care provider. Document Revised: 04/06/2022 Document Reviewed:  04/06/2022 Elsevier Patient Education  2024 [employer].

## 2024-07-27 NOTE — Progress Notes (Signed)
 Terry [name] is a 68 y.o. male presents to office today for annual physical exam examination.     Type 2 Diabetes with hypertension, hyperlipidemia:  Glucometer: Currently using fingerstick glucose testing and reports that he was expecting his A1c to be somewhere around 6.7 was a little surprised that it was higher but he admits that he only checks blood sugars fasting and typically not postprandial.  He has been under a lot more stress related to his wife's health and to the passing of his mother in May.  He is not currently seeing anyone for grief counseling but is interested in this.  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  Last A1c:  Lab Results  Component Value Date   HGBA1C 7.0 (H) 07/24/2024   Nephropathy screen indicated?:  Completed Last flu, zoster and/or pneumovax:  Immunization History  Administered Date(s) Administered   Fluad Quad(high Dose 65+) 08/19/2020, 07/20/2021, 08/01/2022   INFLUENZA, HIGH DOSE SEASONAL PF 07/27/2024   Influenza Split 10/13/2015   Influenza Whole 08/01/2010   Influenza,inj,Quad PF,6+ Mos 08/01/2016, 08/30/2017, 10/14/2018, 08/11/2019   Influenza-Unspecified 08/05/2023   Moderna Covid-19 Vaccine Bivalent Booster 59yrs & up 08/08/2021   Moderna Sars-Covid-2 Vaccination 01/05/2020, 02/02/2020, 02/28/2021   PNEUMOCOCCAL CONJUGATE-20 10/16/2021   Pneumococcal Polysaccharide-23 08/20/2019   Tdap 03/10/2008, 07/02/2018   Unspecified SARS-COV-2 Vaccination 08/05/2023   Zoster Recombinant(Shingrix ) 09/10/2022, 04/08/2023   Zoster, Live 01/11/2017    ROS: Denies dizziness, LOC, polyuria, polydipsia, unintended weight loss/gain, foot ulcerations, numbness or tingling in extremities, shortness of breath or chest pain.   Occupation: Optician, dispensing for a [employer], Marital status: married, Substance use: none Health Maintenance Due  Topic Date Due   Influenza Vaccine   06/05/2024    Immunization History  Administered Date(s) Administered   Fluad Quad(high Dose 65+) 08/19/2020, 07/20/2021, 08/01/2022   Influenza Split 10/13/2015   Influenza Whole 08/01/2010   Influenza,inj,Quad PF,6+ Mos 08/01/2016, 08/30/2017, 10/14/2018, 08/11/2019   Influenza-Unspecified 08/05/2023   Moderna Covid-19 Vaccine Bivalent Booster 58yrs & up 08/08/2021   Moderna Sars-Covid-2 Vaccination 01/05/2020, 02/02/2020, 02/28/2021   PNEUMOCOCCAL CONJUGATE-20 10/16/2021   Pneumococcal Polysaccharide-23 08/20/2019   Tdap 03/10/2008, 07/02/2018   Unspecified SARS-COV-2 Vaccination 08/05/2023   Zoster Recombinant(Shingrix ) 09/10/2022, 04/08/2023   Zoster, Live 01/11/2017   Past Medical History:  Diagnosis Date   Diabetes mellitus without complication (HCC)    on meds   Elevated blood sugar    Esophageal reflux    hx of   Essential hypertension, benign    on meds   Glaucoma    on meds (drops)   Hyperplasia of prostate    Internal hemorrhoids    Other and unspecified hyperlipidemia    on meds   Other testicular hypofunction    Sleep apnea    no longer uses CPAP- weight lost   Tubular adenoma of colon    Unspecified glaucoma(365.9)    Unspecified hemorrhoids without mention of complication    Social History   Socioeconomic History   Marital status: Married    Spouse name: Not on file   Number of children: Not on file   Years of education: Not on file   Highest education level: Not on file  Occupational History   Not on file  Tobacco Use   Smoking status: Never   Smokeless tobacco: Never  Vaping Use   Vaping status: Never Used  Substance and Sexual Activity  Alcohol use: Not Currently    Comment: VERY RARE   Drug use: No   Sexual activity: Not Currently  Other Topics Concern   Not on file  Social History Narrative   Not on file   Social Drivers of Health   Financial Resource Strain: Low Risk  (10/21/2023)   Overall Financial Resource Strain (CARDIA)     Difficulty of Paying Living Expenses: Not hard at all  Food Insecurity: No Food Insecurity (10/21/2023)   Hunger Vital Sign    Worried About Running Out of Food in the Last Year: Never true    Ran Out of Food in the Last Year: Never true  Transportation Needs: No Transportation Needs (10/21/2023)   PRAPARE - Administrator, Civil Service (Medical): No    Lack of Transportation (Non-Medical): No  Physical Activity: Sufficiently Active (10/21/2023)   Exercise Vital Sign    Days of Exercise per Week: 5 days    Minutes of Exercise per Session: 30 min  Stress: No Stress Concern Present (10/21/2023)   [employer] of Occupational Health - Occupational Stress Questionnaire    Feeling of Stress : Not at all  Social Connections: Socially Integrated (10/21/2023)   Social Connection and Isolation Panel    Frequency of Communication with Friends and Family: More than three times a week    Frequency of Social Gatherings with Friends and Family: Three times a week    Attends Religious Services: More than 4 times per year    Active Member of Clubs or Organizations: Yes    Attends Banker Meetings: More than 4 times per year    Marital Status: Married  Catering manager Violence: Not At Risk (10/21/2023)   Humiliation, Afraid, Rape, and Kick questionnaire    Fear of Current or Ex-Partner: No    Emotionally Abused: No    Physically Abused: No    Sexually Abused: No   Past Surgical History:  Procedure Laterality Date   COLONOSCOPY  2019   JMP-MAC-suprep(good)-tics/int hems/polyps   DENTAL SURGERY  1972   LIPOMA EXCISION Right 2000   shoulder   Family History  Problem Relation Age of Onset   Heart disease Mother    Stroke Father    Colon cancer Paternal Uncle        47's   Esophageal cancer Neg Hx    Rectal cancer Neg Hx    Stomach cancer Neg Hx     Current Outpatient Medications:    amLODipine  (NORVASC ) 5 MG tablet, TAKE 1 TABLET DAILY, Disp: 90  tablet, Rfl: 1   atorvastatin  (LIPITOR) 20 MG tablet, TAKE 1 TABLET DAILY AS DIRECTED, Disp: 90 tablet, Rfl: 1   bimatoprost (LUMIGAN) 0.01 % SOLN, 1 drop at bedtime., Disp: , Rfl:    Cholecalciferol (VITAMIN D3) 5000 units CAPS, Take 1 capsule by mouth 3 (three) times a week., Disp: , Rfl:    Cinnamon 500 MG TABS, Take 1 tablet by mouth 2 (two) times daily., Disp: , Rfl:    dorzolamide-timolol (COSOPT) 2-0.5 % ophthalmic solution, Place 1 drop into both eyes 2 (two) times daily., Disp: , Rfl:    fluticasone  (FLONASE ) 50 MCG/ACT nasal spray, USE 2 SPRAYS IN EACH NOSTRIL DAILY, Disp: 48 g, Rfl: 3   glucose blood (FREESTYLE LITE) test strip, TEST BLOOD SUGAR TWICE A DAY Dx E11.9, Disp: 200 strip, Rfl: 3   hydrochlorothiazide  (MICROZIDE ) 12.5 MG capsule, TAKE 1 CAPSULE DAILY, Disp: 90 capsule, Rfl: 1   icosapent  Ethyl (  VASCEPA ) 1 g capsule, TAKE 2 CAPSULES TWICE A DAY, Disp: 360 capsule, Rfl: 1   metFORMIN  (GLUCOPHAGE -XR) 500 MG 24 hr tablet, TAKE 2 TABLETS DAILY WITH BREAKFAST (Patient taking differently: 500 mg 2 (two) times daily with a meal.), Disp: 180 tablet, Rfl: 1   olmesartan  (BENICAR ) 40 MG tablet, TAKE 1 TABLET DAILY, Disp: 90 tablet, Rfl: 1   Semaglutide ,0.25 or 0.5MG /DOS, (OZEMPIC , 0.25 OR 0.5 MG/DOSE,) 2 MG/3ML SOPN, INJECT 0.5 MG UNDER THE SKIN EVERY 7 DAYS, Disp: 9 mL, Rfl: 1   tadalafil  (CIALIS ) 5 MG tablet, Take 1 tablet (5 mg total) by mouth daily. (Patient not taking: Reported on 07/27/2024), Disp: 90 tablet, Rfl: 3   testosterone  cypionate (DEPO-TESTOSTERONE ) 200 MG/ML injection, Inject 1 mL (200 mg total) into the muscle every 14 (fourteen) days. (Patient not taking: Reported on 07/27/2024), Disp: 6 mL, Rfl: 0  Allergies  Allergen Reactions   Jardiance  [Empagliflozin ]     Recurrent UTI   Penicillins Nausea And Vomiting     ROS: Review of Systems A comprehensive review of systems was negative except for: Integument/breast: positive for skin lesion(s) Behavioral/Psych:  positive for grief/ stress    Physical exam BP 115/73   Pulse 71   Temp (!) 97 F (36.1 C)   Ht 5' 8 (1.727 m)   Wt 168 lb (76.2 kg)   SpO2 97%   BMI 25.54 kg/m  General appearance: alert, cooperative, appears stated age, and no distress Head: Normocephalic, without obvious abnormality, atraumatic Eyes: negative findings: lids and lashes normal, conjunctivae and sclerae normal, corneas clear, and pupils equal, round, reactive to light and accomodation Ears: normal TM's and external ear canals both ears Nose: Nares normal. Septum midline. Mucosa normal. No drainage or sinus tenderness. Throat: lips, mucosa, and tongue normal; teeth and gums normal Neck: no adenopathy, no carotid bruit, supple, symmetrical, trachea midline, and thyroid not enlarged, symmetric, no tenderness/mass/nodules Back: symmetric, no curvature. ROM normal. No CVA tenderness. Lungs: clear to auscultation bilaterally Chest wall: no tenderness Heart: regular rate and rhythm, S1, S2 normal, no murmur, click, rub or gallop Abdomen: soft, non-tender; bowel sounds normal; no masses,  no organomegaly Extremities: extremities normal, atraumatic, no cyanosis or edema Pulses: 2+ and symmetric Skin: Has several areas of postinflammatory hypopigmentation along the upper extremities as well as several solar lentigo and nevi.  He has an actinic keratosis noted along the posterior forearm midway and also a lesion of the apex of his crown Lymph nodes: Cervical, supraclavicular, and axillary nodes normal. Neurologic: Grossly normal      07/27/2024    8:59 AM 12/04/2023    3:29 PM 10/21/2023   11:06 AM  Depression screen PHQ 2/9  Decreased Interest 0 0 0  Down, Depressed, Hopeless 0 0 0  PHQ - 2 Score 0 0 0  Altered sleeping 1 0   Tired, decreased energy 0 0   Change in appetite 1 0   Feeling bad or failure about yourself  0 0   Trouble concentrating 0 0   Moving slowly or fidgety/restless 0 0   Suicidal thoughts 0 0    PHQ-9 Score 2 0   Difficult doing work/chores Not difficult at all Not difficult at all       07/27/2024    9:00 AM 12/04/2023    3:29 PM 03/25/2023    8:36 AM 12/24/2022   10:25 AM  GAD 7 : Generalized Anxiety Score  Nervous, Anxious, on Edge 0 0 0 0  Control/stop worrying  0 0 0 0  Worry too much - different things 1 0 0 0  Trouble relaxing 0 0 0 0  Restless 0 0 0 0  Easily annoyed or irritable 1 0 0 0  Afraid - awful might happen 0 0 0 0  Total GAD 7 Score 2 0 0 0  Anxiety Difficulty Not difficult at all Not difficult at all      Recent Results (from the past 2160 hours)  HM DIABETES EYE EXAM     Status: None   Collection Time: 04/29/24 11:57 AM  Result Value Ref Range   HM Diabetic Eye Exam No Retinopathy No Retinopathy    Comment: Abstracted  by HIM  Urinalysis, Routine w reflex microscopic     Status: None   Collection Time: 06/04/24  2:23 PM  Result Value Ref Range   Specific Gravity, UA 1.020 1.005 - 1.030   pH, UA 6.0 5.0 - 7.5   Color, UA Yellow Yellow   Appearance Ur Clear Clear   Leukocytes,UA Negative Negative   Protein,UA Negative Negative/Trace   Glucose, UA Negative Negative   Ketones, UA Negative Negative   RBC, UA Negative Negative   Bilirubin, UA Negative Negative   Urobilinogen, Ur 0.2 0.2 - 1.0 mg/dL   Nitrite, UA Negative Negative   Microscopic Examination Comment     Comment: Microscopic not indicated and not performed.  Bayer DCA Hb A1c Waived     Status: Abnormal   Collection Time: 07/24/24  8:30 AM  Result Value Ref Range   HB A1C (BAYER DCA - WAIVED) 7.0 (H) 4.8 - 5.6 %    Comment:          Prediabetes: 5.7 - 6.4          Diabetes: >6.4          Glycemic control for adults with diabetes: <7.0   TSH     Status: None   Collection Time: 07/24/24  8:34 AM  Result Value Ref Range   TSH 1.610 0.450 - 4.500 uIU/mL  Lipid panel     Status: None   Collection Time: 07/24/24  8:34 AM  Result Value Ref Range   Cholesterol, Total 132 100 - 199  mg/dL   Triglycerides 854 0 - 149 mg/dL   HDL 51 >60 mg/dL   VLDL Cholesterol Cal 25 5 - 40 mg/dL   LDL Chol Calc (NIH) 56 0 - 99 mg/dL   Chol/HDL Ratio 2.6 0.0 - 5.0 ratio    Comment:                                   T. Chol/HDL Ratio                                             Men  Women                               1/2 Avg.Risk  3.4    3.3                                   Avg.Risk  5.0    4.4  2X Avg.Risk  9.6    7.1                                3X Avg.Risk 23.4   11.0   CMP14+EGFR     Status: Abnormal   Collection Time: 07/24/24  8:34 AM  Result Value Ref Range   Glucose 176 (H) 70 - 99 mg/dL   BUN 21 8 - 27 mg/dL   Creatinine, Ser 9.13 0.76 - 1.27 mg/dL   eGFR 94 >40 fO/fpw/8.26   BUN/Creatinine Ratio 24 10 - 24   Sodium 138 134 - 144 mmol/L   Potassium 4.5 3.5 - 5.2 mmol/L   Chloride 100 96 - 106 mmol/L   CO2 22 20 - 29 mmol/L   Calcium  10.0 8.6 - 10.2 mg/dL   Total Protein 7.5 6.0 - 8.5 g/dL   Albumin 5.0 (H) 3.9 - 4.9 g/dL   Globulin, Total 2.5 1.5 - 4.5 g/dL   Bilirubin Total 0.6 0.0 - 1.2 mg/dL   Alkaline Phosphatase 54 47 - 123 IU/L    Comment:               **Please note reference interval change**   AST 25 0 - 40 IU/L   ALT 38 0 - 44 IU/L  CBC with Differential/Platelet     Status: None   Collection Time: 07/24/24  8:34 AM  Result Value Ref Range   WBC 6.4 3.4 - 10.8 x10E3/uL   RBC 4.37 4.14 - 5.80 x10E6/uL   Hemoglobin 13.8 13.0 - 17.7 g/dL   Hematocrit 59.4 62.4 - 51.0 %   MCV 93 79 - 97 fL   MCH 31.6 26.6 - 33.0 pg   MCHC 34.1 31.5 - 35.7 g/dL   RDW 86.8 88.3 - 84.5 %   Platelets 288 150 - 450 x10E3/uL   Neutrophils 47 Not Estab. %   Lymphs 43 Not Estab. %   Monocytes 7 Not Estab. %   Eos 2 Not Estab. %   Basos 1 Not Estab. %   Neutrophils Absolute 3.1 1.4 - 7.0 x10E3/uL   Lymphocytes Absolute 2.8 0.7 - 3.1 x10E3/uL   Monocytes Absolute 0.4 0.1 - 0.9 x10E3/uL   EOS (ABSOLUTE) 0.1 0.0 - 0.4 x10E3/uL   Basophils  Absolute 0.0 0.0 - 0.2 x10E3/uL   Immature Granulocytes 0 Not Estab. %   Immature Grans (Abs) 0.0 0.0 - 0.1 x10E3/uL  Testosterone      Status: Abnormal   Collection Time: 07/24/24  8:34 AM  Result Value Ref Range   Testosterone  221 (L) 264 - 916 ng/dL    Comment: Adult male reference interval is based on a population of healthy nonobese males (BMI <30) between 87 and 75 years old. Travison, et.al. JCEM [phone]. PMID: 71675896.   Microalbumin / creatinine urine ratio     Status: None   Collection Time: 07/24/24  9:20 AM  Result Value Ref Range   Creatinine, Urine 166.1 Not Estab. mg/dL   Microalbumin, Urine 88.4 Not Estab. ug/mL   Microalb/Creat Ratio 7 0 - 29 mg/g creat    Comment:                        Normal:                0 -  29  Moderately increased: 30 - 300                        Severely increased:       >300     Cryotherapy Procedure:  Risks and benefits of procedure were reviewed with the patient.  Written consent obtained and scanned into the chart.  Lesion of concern was identified and located on right forearm and crown.  Liquid nitrogen was applied to area of concern and extending out 1 millimeters beyond the border of the lesion.  Treated area was allowed to come back to room temperature before treating it a second time.  Patient tolerated procedure well and there were no immediate complications.  Home care instructions were reviewed with the patient and a handout was provided.  Assessment/ Plan: [name] Bucks here for annual physical exam.   Annual physical exam  Actinic keratoses  Grief reaction  Diabetes mellitus treated with injections of non-insulin medication (HCC) - Plan: Continuous Glucose Sensor (DEXCOM G7 SENSOR) MISC, metFORMIN  (GLUCOPHAGE -XR) 500 MG 24 hr tablet, Semaglutide ,0.25 or 0.5MG /DOS, (OZEMPIC , 0.25 OR 0.5 MG/DOSE,) 2 MG/3ML SOPN, COVID-19 mRNA vaccine, Pfizer, (COMIRNATY) syringe  Hypertension associated  with diabetes (HCC) - Plan: amLODipine  (NORVASC ) 5 MG tablet, hydrochlorothiazide  (MICROZIDE ) 12.5 MG capsule, olmesartan  (BENICAR ) 40 MG tablet, COVID-19 mRNA vaccine, Pfizer, (COMIRNATY) syringe  Hyperlipidemia associated with type 2 diabetes mellitus (HCC) - Plan: atorvastatin  (LIPITOR) 20 MG tablet, icosapent  Ethyl (VASCEPA ) 1 g capsule, COVID-19 mRNA vaccine, Pfizer, (COMIRNATY) syringe  Testosterone  deficiency  Benign prostatic hyperplasia, unspecified whether lower urinary tract symptoms present  Encounter for immunization - Plan: Flu vaccine HIGH DOSE PF(Fluzone Trivalent)   He had a flu vaccine administered today.  He has been given COVID vaccination prescription written  Actinic keratoses were treated with cryoablation today x 2 lesions.  No immediate complications.  Handout provided  Gave him information on a Librarian, academic.  Still working through grief and sadly causing some issues with blood sugar  Dexcom7 sample placed on back of Left arm.  Rx sent.  Sugar is controlled but borderline we discussed diet modification increase physical exercise to naturally reduce blood sugar levels.  However, if unable to achieve these goals independently we can consider advancing the Ozempic  to 1 mg weekly  Blood pressure is well-controlled.  Cholesterol stable.  Continue all medications as prescribed  Has follow-up with urology for testosterone  deficiency as his insurance would not allow him to have a prescription for testosterone  injections from a nonurologist  BPH stable and PSA normal  PSA collected in June and was within normal range.  Counseled on healthy lifestyle choices, including diet (rich in fruits, vegetables and lean meats and low in salt and simple carbohydrates) and exercise (at least 30 minutes of moderate physical activity daily).  Patient to follow up 29m for DM/ mood  Logun Colavito M. Jolinda, DO

## 2024-07-28 LAB — TOXASSURE SELECT 13 (MW), URINE

## 2024-07-30 ENCOUNTER — Telehealth: Payer: Self-pay | Admitting: Pharmacy Technician

## 2024-07-30 ENCOUNTER — Other Ambulatory Visit (HOSPITAL_COMMUNITY): Payer: Self-pay

## 2024-07-30 NOTE — Telephone Encounter (Addendum)
 PA cancelled in Latent. Received PA form via fax. Per PA form, coverage is not approved if patient is not using basal or prandial insulin injections within the past 180 days.       PA form in media

## 2024-07-30 NOTE — Telephone Encounter (Signed)
 Pharmacy Patient Advocate Encounter   Received notification from Onbase that prior authorization for Dexcom G7 is required/requested.   Insurance verification completed.   The patient is insured through [employer] .   Per test claim: PA required; PA started via CoverMyMeds. KEY [id number] . Waiting for clinical questions to populate.

## 2024-07-31 NOTE — Telephone Encounter (Signed)
 Please make patient aware that his insurance will not cover that CGM without him being on insulins

## 2024-08-03 ENCOUNTER — Telehealth: Payer: Self-pay | Admitting: Family Medicine

## 2024-08-03 ENCOUNTER — Encounter: Payer: Self-pay | Admitting: Urology

## 2024-08-03 ENCOUNTER — Ambulatory Visit: Admitting: Urology

## 2024-08-03 VITALS — BP 121/74 | HR 64

## 2024-08-03 DIAGNOSIS — E291 Testicular hypofunction: Secondary | ICD-10-CM

## 2024-08-03 DIAGNOSIS — N400 Enlarged prostate without lower urinary tract symptoms: Secondary | ICD-10-CM | POA: Diagnosis not present

## 2024-08-03 DIAGNOSIS — N529 Male erectile dysfunction, unspecified: Secondary | ICD-10-CM | POA: Diagnosis not present

## 2024-08-03 DIAGNOSIS — N5201 Erectile dysfunction due to arterial insufficiency: Secondary | ICD-10-CM

## 2024-08-03 LAB — URINALYSIS, ROUTINE W REFLEX MICROSCOPIC
Bilirubin, UA: NEGATIVE
Glucose, UA: NEGATIVE
Ketones, UA: NEGATIVE
Leukocytes,UA: NEGATIVE
Nitrite, UA: NEGATIVE
Protein,UA: NEGATIVE
RBC, UA: NEGATIVE
Specific Gravity, UA: 1.025 (ref 1.005–1.030)
Urobilinogen, Ur: 1 mg/dL (ref 0.2–1.0)
pH, UA: 6 (ref 5.0–7.5)

## 2024-08-03 MED ORDER — TESTOSTERONE 50 MG/5GM (1%) TD GEL: 5.0000 g | Freq: Every day | TRANSDERMAL | 5 refills | Status: AC

## 2024-08-03 NOTE — Telephone Encounter (Unsigned)
 Copied from CRM [id number]. Topic: General - Other >> Jul 31, 2024  5:29 PM Kevelyn M wrote: Reason for CRM: Patient is returning Karla's call.  Call back # [phone]

## 2024-08-03 NOTE — Progress Notes (Signed)
 08/03/2024 12:10 PM   Terry [name] Sep 30, 1956 [id number]  Referring provider: [name], DO [address] [place],  [place] [address]  No chief complaint on file.   HPI:  F/u -     1) BPH - no prior meds or surgery. He felt poorly and had dysuria. Drank more and felt better. Then pain in left flank. UA many bacteria. Took abx - two rounds. Improved. May 2024 PSA 3.1. Has frequency and nocturia. Urgency. Slower stream. IPSS 11. His brother had BPH.  Rx daily tadalafil  Mar 2025. IPSS 4. He had GERD and stopped, but it worked.     2) ED - noticed more trouble getting and maintaining erection. He is on T replacement. Tried daily tadalafil  as above. Switched to prn tadalafil .  Today, seen for the above. His Jun 2025 PSA 2.8. Sep 2025 Hct 40.5. T 221 (recent range 452 - 1382). He used topical testosterone . Wants to switch Rx to me. He's been on T for years. Had kids. Was on T gel packet - 50 mg / gm.     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   PMH: Past Medical History:  Diagnosis Date   Diabetes mellitus without complication (HCC)    on meds   Elevated blood sugar    Esophageal reflux    hx of   Essential hypertension, benign    on meds   Glaucoma    on meds (drops)   Hyperplasia of prostate    Internal hemorrhoids    Other and unspecified hyperlipidemia    on meds   Other testicular hypofunction    Sleep apnea    no longer uses CPAP- weight lost   Tubular adenoma of colon    Unspecified glaucoma(365.9)    Unspecified hemorrhoids without mention of complication     Surgical History: Past Surgical History:  Procedure Laterality Date   COLONOSCOPY  2019   JMP-MAC-suprep(good)-tics/int hems/polyps   DENTAL SURGERY  1972   LIPOMA EXCISION Right 2000   shoulder    Home Medications:  Allergies as of 08/03/2024       Reactions   Jardiance  [empagliflozin ]    Recurrent UTI   Penicillins Nausea And Vomiting        Medication List        Accurate as  of August 03, 2024 12:10 PM. If you have any questions, ask your nurse or doctor.          amLODipine  5 MG tablet Commonly known as: NORVASC  Take 1 tablet (5 mg total) by mouth daily.   atorvastatin  20 MG tablet Commonly known as: LIPITOR Take 1 tablet (20 mg total) by mouth daily. as directed   bimatoprost 0.01 % Soln Commonly known as: LUMIGAN 1 drop at bedtime.   Cinnamon 500 MG Tabs Take 1 tablet by mouth 2 (two) times daily.   Dexcom G7 Sensor Misc Use continuously to monitor BGs. Change every 10 days. E11.9   dorzolamide-timolol 2-0.5 % ophthalmic solution Commonly known as: COSOPT Place 1 drop into both eyes 2 (two) times daily.   fluticasone  50 MCG/ACT nasal spray Commonly known as: FLONASE  USE 2 SPRAYS IN EACH NOSTRIL DAILY   FREESTYLE LITE test strip Generic drug: glucose blood TEST BLOOD SUGAR TWICE A DAY Dx E11.9   hydrochlorothiazide  12.5 MG capsule Commonly known as: MICROZIDE  Take 1 capsule (12.5 mg total) by mouth daily.   icosapent  Ethyl 1 g capsule Commonly known as: VASCEPA  Take 2 capsules (2 g  total) by mouth 2 (two) times daily.   metFORMIN  500 MG 24 hr tablet Commonly known as: GLUCOPHAGE -XR Take 2 tablets (1,000 mg total) by mouth daily with breakfast.   olmesartan  40 MG tablet Commonly known as: BENICAR  Take 1 tablet (40 mg total) by mouth daily.   Ozempic  (0.25 or 0.5 MG/DOSE) 2 MG/3ML Sopn Generic drug: Semaglutide (0.25 or 0.5MG /DOS) Inject 0.5 mg into the skin every 7 (seven) days.   tadalafil  5 MG tablet Commonly known as: CIALIS  Take 1 tablet (5 mg total) by mouth daily.   Vitamin D3 125 MCG (5000 UT) Caps Take 1 capsule by mouth 3 (three) times a week.        Allergies:  Allergies  Allergen Reactions   Jardiance  [Empagliflozin ]     Recurrent UTI   Penicillins Nausea And Vomiting    Family History: Family History  Problem Relation Age of Onset   Heart disease Mother    Stroke Father    Colon cancer  Paternal Uncle        42's   Esophageal cancer Neg Hx    Rectal cancer Neg Hx    Stomach cancer Neg Hx     Social History:  reports that he has never smoked. He has never used smokeless tobacco. He reports that he does not currently use alcohol. He reports that he does not use drugs.   Physical Exam: BP 121/74   Pulse 64   Constitutional:  Alert and oriented, No acute distress. HEENT:  AT, moist mucus membranes.  Trachea midline, no masses. Cardiovascular: No clubbing, cyanosis, or edema. Respiratory: Normal respiratory effort, no increased work of breathing. GI: Abdomen is soft, nontender, nondistended, no abdominal masses GU: No CVA tenderness Lymph: No cervical or inguinal lymphadenopathy. Skin: No rashes, bruises or suspicious lesions. Neurologic: Grossly intact, no focal deficits, moving all 4 extremities. Psychiatric: Normal mood and affect.  Laboratory Data: Lab Results  Component Value Date   WBC 6.4 07/24/2024   HGB 13.8 07/24/2024   HCT 40.5 07/24/2024   MCV 93 07/24/2024   PLT 288 07/24/2024    Lab Results  Component Value Date   CREATININE 0.86 07/24/2024    Lab Results  Component Value Date   PSA 1.8 07/08/2014   PSA 1.7 10/15/2013    Lab Results  Component Value Date   TESTOSTERONE  221 (L) 07/24/2024    Lab Results  Component Value Date   HGBA1C 7.0 (H) 07/24/2024    Urinalysis    Component Value Date/Time   COLORURINE YELLOW 10/26/2008 0722   APPEARANCEUR Clear 06/04/2024 1423   LABSPEC 1.020 10/26/2008 0722   PHURINE 7.0 10/26/2008 0722   GLUCOSEU Negative 06/04/2024 1423   HGBUR NEGATIVE 10/26/2008 0722   BILIRUBINUR Negative 06/04/2024 1423   KETONESUR NEGATIVE 10/26/2008 0722   PROTEINUR Negative 06/04/2024 1423   PROTEINUR NEGATIVE 10/26/2008 0722   UROBILINOGEN negative 07/08/2014 1041   UROBILINOGEN 0.2 10/26/2008 0722   NITRITE Negative 06/04/2024 1423   NITRITE NEGATIVE 10/26/2008 0722   LEUKOCYTESUR Negative 06/04/2024  1423    Lab Results  Component Value Date   LABMICR 11.5 07/24/2024   WBCUA >30 (A) 12/04/2023   RBCUA 0-2 02/24/2018   LABEPIT None seen 12/04/2023   MUCUS Present 06/26/2017   BACTERIA Many (A) 12/04/2023    Pertinent Imaging: N/a    Assessment & Plan:    1. Low testosterone  - Discussed [place] data and how T relates to MACE and PCa. Also, MSK health, mood, ED, etc. Risk  of polycythemia discussed among others.  - Urinalysis, Routine w reflex microscopic   No follow-ups on file.  Terry Brooks, MD  [facility]  [address] [place], [place] [address] [phone]

## 2024-08-03 NOTE — Telephone Encounter (Signed)
 Left message for patient to call back. Unsure if call is regarding the prior auth denial for his insulin or to go over lab results. Please clarify with patient if he calls back.

## 2024-08-04 NOTE — Telephone Encounter (Signed)
 Duplicate encounter. Left message for patient to call back. Unsure if call is regarding the prior auth denial for his insulin or to go over lab results. Please clarify with patient if he calls back.

## 2024-09-14 ENCOUNTER — Other Ambulatory Visit: Payer: Self-pay

## 2024-09-14 ENCOUNTER — Other Ambulatory Visit (HOSPITAL_BASED_OUTPATIENT_CLINIC_OR_DEPARTMENT_OTHER): Payer: Self-pay

## 2024-09-14 ENCOUNTER — Emergency Department (HOSPITAL_BASED_OUTPATIENT_CLINIC_OR_DEPARTMENT_OTHER)

## 2024-09-14 ENCOUNTER — Encounter (HOSPITAL_BASED_OUTPATIENT_CLINIC_OR_DEPARTMENT_OTHER): Payer: Self-pay | Admitting: Emergency Medicine

## 2024-09-14 ENCOUNTER — Emergency Department (HOSPITAL_BASED_OUTPATIENT_CLINIC_OR_DEPARTMENT_OTHER)
Admission: EM | Admit: 2024-09-14 | Discharge: 2024-09-14 | Disposition: A | Discharge status: Home / Self Care | Source: Ambulatory Visit | Attending: Emergency Medicine | Admitting: Emergency Medicine

## 2024-09-14 DIAGNOSIS — N50811 Right testicular pain: Secondary | ICD-10-CM | POA: Diagnosis present

## 2024-09-14 DIAGNOSIS — N5089 Other specified disorders of the male genital organs: Secondary | ICD-10-CM | POA: Diagnosis not present

## 2024-09-14 DIAGNOSIS — N453 Epididymo-orchitis: Secondary | ICD-10-CM | POA: Insufficient documentation

## 2024-09-14 LAB — URINALYSIS, W/ REFLEX TO CULTURE (INFECTION SUSPECTED)
Bacteria, UA: NONE SEEN
Bilirubin Urine: NEGATIVE
Glucose, UA: NEGATIVE mg/dL
Hgb urine dipstick: NEGATIVE
Ketones, ur: NEGATIVE mg/dL
Leukocytes,Ua: NEGATIVE
Nitrite: NEGATIVE
Protein, ur: NEGATIVE mg/dL
Specific Gravity, Urine: 1.018 (ref 1.005–1.030)
pH: 6 (ref 5.0–8.0)

## 2024-09-14 MED ORDER — LEVOFLOXACIN 500 MG PO TABS
500.0000 mg | ORAL_TABLET | Freq: Every day | ORAL | 0 refills | Status: AC
  Filled 2024-09-14: qty 10, 10d supply, fill #0

## 2024-09-14 NOTE — Discharge Instructions (Addendum)
 Your US  revealed evidence of orchitis.  Your symptoms are consistent with epididymitis/orchitis for which we will treat with a course of antibiotics.  Please take Levaquin for the next 10 days and follow-up with your primary care provider.  Recommend NSAIDs for pain control.  There was a mass noted on your testicle which is favored to be a benign cyst but recommend repeat ultrasound in 6 months to ensure stability.  IMPRESSION:  1. There is favored mildly asymmetrically increased color blood flow  noted within the RIGHT testicle. This is nonspecific but could  reflect orchitis.  2. There is a 7 mm anechoic mass noted within the RIGHT testicle.  This is favored to reflect a benign cyst. Recommend follow-up  ultrasound in 6 months to assess for stability.

## 2024-09-14 NOTE — ED Provider Notes (Signed)
 [place] EMERGENCY DEPARTMENT AT [facility] Provider Note   CSN: [id number] Arrival date & time: 09/14/24  1004     Patient presents with: Groin Pain   Terry [name] is a 68 y.o. male.    Groin Pain     68 year old male presenting to the emergency department with a chief complaint of right testicular pain.  The patient states that he pulled his groin in July and saw urologist back then who thought it was likely a muscle strain.  He had improvement in symptoms.  He states that since this past Friday he has had pain in the posterior aspect of the right testicle, no abdominal pain, no redness or swelling, denies any dysuria, frequency, penile discharge or lesions.  He was initially seen at urgent care but referred to the emergency department for Doppler imaging.  Prior to Admission medications   Medication Sig Start Date End Date Taking? Authorizing Provider  levofloxacin (LEVAQUIN) 500 MG tablet Take 1 tablet (500 mg total) by mouth daily for 10 days. 09/14/24 09/24/24 Yes Jerrol Agent, MD  amLODipine  (NORVASC ) 5 MG tablet Take 1 tablet (5 mg total) by mouth daily. 07/27/24   [name], DO  atorvastatin  (LIPITOR) 20 MG tablet Take 1 tablet (20 mg total) by mouth daily. as directed 07/27/24   Jolinda Norene M, DO  bimatoprost (LUMIGAN) 0.01 % SOLN 1 drop at bedtime.    [provider]  Cholecalciferol (VITAMIN D3) 5000 units CAPS Take 1 capsule by mouth 3 (three) times a week.    [provider]  Cinnamon 500 MG TABS Take 1 tablet by mouth 2 (two) times daily.    [provider]  Continuous Glucose Sensor (DEXCOM G7 SENSOR) MISC Use continuously to monitor BGs. Change every 10 days. E11.9 07/27/24   Jolinda Norene M, DO  dorzolamide-timolol (COSOPT) 2-0.5 % ophthalmic solution Place 1 drop into both eyes 2 (two) times daily. 08/24/22   [provider]  fluticasone  (FLONASE ) 50 MCG/ACT nasal spray USE 2 SPRAYS IN EACH  NOSTRIL DAILY 06/08/24   Gottschalk, Ashly M, DO  glucose blood (FREESTYLE LITE) test strip TEST BLOOD SUGAR TWICE A DAY Dx E11.9 03/06/24   Jolinda Norene M, DO  hydrochlorothiazide  (MICROZIDE ) 12.5 MG capsule Take 1 capsule (12.5 mg total) by mouth daily. 07/27/24   [name], DO  icosapent  Ethyl (VASCEPA ) 1 g capsule Take 2 capsules (2 g total) by mouth 2 (two) times daily. 07/27/24   [name], DO  metFORMIN  (GLUCOPHAGE -XR) 500 MG 24 hr tablet Take 2 tablets (1,000 mg total) by mouth daily with breakfast. 07/27/24   Jolinda Norene M, DO  olmesartan  (BENICAR ) 40 MG tablet Take 1 tablet (40 mg total) by mouth daily. 07/27/24   [name], DO  Semaglutide ,0.25 or 0.5MG /DOS, (OZEMPIC , 0.25 OR 0.5 MG/DOSE,) 2 MG/3ML SOPN Inject 0.5 mg into the skin every 7 (seven) days. 07/27/24   [name], DO  tadalafil  (CIALIS ) 5 MG tablet Take 1 tablet (5 mg total) by mouth daily. Patient not taking: Reported on 07/27/2024 01/06/24   Nieves Cough, MD  testosterone  (ANDROGEL ) 50 MG/5GM (1%) GEL Place 5 g onto the skin daily. 08/03/24   Nieves Cough, MD    Allergies: Jardiance  [empagliflozin ] and Penicillins    Review of Systems  All other systems reviewed and are negative.   Updated Vital Signs BP 125/79   Pulse (!) 55   Temp 97.6 F (36.4 C) (Oral)   Resp 18  Wt 72.1 kg   SpO2 98%   BMI 24.18 kg/m   Physical Exam Vitals and nursing note reviewed. Exam conducted with a chaperone present.  Constitutional:      General: He is not in acute distress. HENT:     Head: Normocephalic and atraumatic.  Eyes:     Conjunctiva/sclera: Conjunctivae normal.     Pupils: Pupils are equal, round, and reactive to light.  Cardiovascular:     Rate and Rhythm: Normal rate and regular rhythm.  Pulmonary:     Effort: Pulmonary effort is normal. No respiratory distress.  Abdominal:     General: There is no distension.     Tenderness: There is no guarding.   Genitourinary:    Comments: Mild tenderness of the posterior aspect of the right testicle, no hernias palpated bilaterally, no significant swelling, no discharge or drainage, no lesions, no erythema Musculoskeletal:        General: No deformity or signs of injury.     Cervical back: Neck supple.  Skin:    Findings: No lesion or rash.  Neurological:     General: No focal deficit present.     Mental Status: He is alert. Mental status is at baseline.     (all labs ordered are listed, but only abnormal results are displayed) Labs Reviewed  URINALYSIS, W/ REFLEX TO CULTURE (INFECTION SUSPECTED)    EKG: None  Radiology: US  SCROTUM W/DOPPLER Result Date: 09/14/2024 CLINICAL DATA:  144846 Testicle pain 144846 EXAM: SCROTAL ULTRASOUND DOPPLER ULTRASOUND OF THE TESTICLES TECHNIQUE: Complete ultrasound examination of the testicles, epididymis, and other scrotal structures was performed. Color and spectral Doppler ultrasound were also utilized to evaluate blood flow to the testicles. COMPARISON:  None Available. FINDINGS: Right testicle Measurements: 3.7 x 1.5 x 2.5 cm. No microlithiasis visualized. There is an oval circumscribed anechoic mass noted superiorly which measures 5 x 5 x 7 mm. Left testicle Measurements: 3.5 x 1.7 x 2.5 cm. No mass or microlithiasis visualized. Right epididymis: Normal in size and appearance. Incidental tiny epididymal head cyst measuring 3 mm. Left epididymis:  Normal in size and appearance. Hydrocele:  None visualized. Varicocele:  None visualized. Pulsed Doppler interrogation of both testes demonstrates normal low resistance arterial and venous waveforms bilaterally. There is favored mildly asymmetrically increased color blood flow noted within the RIGHT testicle. IMPRESSION: 1. There is favored mildly asymmetrically increased color blood flow noted within the RIGHT testicle. This is nonspecific but could reflect orchitis. 2. There is a 7 mm anechoic mass noted within  the RIGHT testicle. This is favored to reflect a benign cyst. Recommend follow-up ultrasound in 6 months to assess for stability. Electronically Signed   By: Corean Salter M.D.   On: 09/14/2024 12:45     Procedures   Medications Ordered in the ED - No data to display                                  Medical Decision Making Amount and/or Complexity of Data Reviewed Radiology: ordered.  Risk Prescription drug management.    68 year old male presenting to the emergency department with a chief complaint of right testicular pain.  The patient states that he pulled his groin in July and saw urologist back then who thought it was likely a muscle strain.  He had improvement in symptoms.  He states that since this past Friday he has had pain in the posterior aspect of  the right testicle, no abdominal pain, no redness or swelling, denies any dysuria, frequency, penile discharge or lesions.  He was initially seen at urgent care but referred to the emergency department for Doppler imaging.  On arrival, the patient was vitally stable, afebrile, bradycardic heart rate 51, hemodynamically stable BP 156/81, saturating 90% on room air.  Patient on exam had testicular tenderness to palpation of the right testicle posteriorly.  Considered epididymitis/orchitis.  Lower concern for testicular torsion.  No evidence for hernia palpated on exam, no evidence of scrotal cellulitis, no crepitus or evidence of infection to suggest Fournier's gangrene.  Urinalysis: Negative for UTI or hematuria.  Testicular ultrasound: IMPRESSION:  1. There is favored mildly asymmetrically increased color blood flow  noted within the RIGHT testicle. This is nonspecific but could  reflect orchitis.  2. There is a 7 mm anechoic mass noted within the RIGHT testicle.  This is favored to reflect a benign cyst. Recommend follow-up  ultrasound in 6 months to assess for stability.    Will treat with a course of Levaquin for  presumed epididymitis/orchitis, advised outpatient PCP follow-up.  Patient informed of incidental findings noted on ultrasound which require outpatient follow-up.     Final diagnoses:  Orchitis and epididymitis  Testicular mass    ED Discharge Orders          Ordered    levofloxacin (LEVAQUIN) 500 MG tablet  Daily        09/14/24 1337               Jerrol Agent, MD 09/14/24 1339

## 2024-09-14 NOTE — ED Triage Notes (Signed)
 Pt reports groin pull in July. Saw urology in July. Reports Rt side testicle aching that started last Friday. Referred by UC

## 2024-09-14 NOTE — Progress Notes (Signed)
  Assessment:   1. Testicular pain, right       Plan:   1. Medications and orders:  Patient's Medications  New Prescriptions   No medications on file     Current Outpatient Medications  Medication Instructions  . atorvastatin  (LIPITOR) 20 mg, Daily  . bimatoprost (LUMIGAN) 0.03% ophthalmic drops 1 drop, At bedtime  . Cholecalciferol (VITAMIN D3) 125 mcg (5000 UT) CAPS capsule 1 capsule, 3 times weekly  . dorzolamide-timolol (COSOPT) 2-0.5 % ophthalmic solution SMARTSIG:In Eye(s)  . hydroCHLOROthiazide  12.5 mg, Daily  . icosapent  ethyl (VASCEPA ) 2 g  . metFORMIN  ER (GLUCOPHAGE -XR) 500 mg 24 hr tablet TAKE 2 TABLETS DAILY WITH BREAKFAST  . mupirocin (BACTROBAN) 2 % ointment Massage into affected areas 2-3 times daily after each hot compress.  . olmesartan  medoxomil (BENICAR ) 40 mg, Daily  . OZEMPIC  (0.25 OR 0.5 MG/DOSE) 0.5 mg       MEDICAL DECISION MAKING  Referred to er  Pt to f/u with pcp or ER for possible sonogram  Pt to f/u with urologist   Encounter Diagnosis  Name Primary?  . Testicular pain, right Yes     The diagnosis was discussed in detail with [name]. We discussed other possible causes of their symptoms, treatment options, risks, and benefits. [name] is in agreement with the management plan and all questions were answered.    Lab orders placed during the encounter:  No results found for this or any previous visit (from the past 24 hours).    Patient's Medications  New Prescriptions   No medications on file       [name] will continue all current prescription medication for chronic conditions. If symptoms do not improve or worsen in the next 2 days, [name] will go to the ED for further evaluation.      Terry Mace, NP   Subjective:    Terry [name] is a 68 y.o. male  who complains of left testicular pain with hx ED due to arterial dysfunction for a few days, states dog stepped on testicle and in July felt groin pain which resolved  bit now as returned and otc medications not working. Pain in actual testicle.The pain is mild and aches.  Patient denies back pain, sorethroat, stomach ache ., denies urine symptoms.  Patient does not have a history of recurrent UTI.  Patient does not have a history of pyelonephritis.   History reviewed. No pertinent past medical history.   Allergies[1]   Social History[2]    Objective:   BP 134/82   Pulse 66   Temp 98.2 F (36.8 C) (Tympanic)   Resp 16   Ht 5' 8 (1.727 m)   Wt 166 lb (75.3 kg)   SpO2 98%   BMI 25.24 kg/m  General: alert, appears stated age, cooperative and no distress ENT: moist mucous membranes Lungs: CTAB, no wheeing, rales or rhonchi Cardio: regular, rate, rhythm with no rubs, murmurs or gallops Back: CVA tenderness absent GU: with MA sharon in room, pelvis examined with right testicular pain with palpation, no groin pain, no erythema or bruising.  No results found for this or any previous visit (from the past 14 hours).        [1] Allergies Allergen Reactions  . Penicillins Other    Unknown   [2] Social History Socioeconomic History  . Marital status: Married  Tobacco Use  . Smoking status: Unknown

## 2024-09-21 ENCOUNTER — Encounter: Payer: Self-pay | Admitting: Family Medicine

## 2024-09-21 ENCOUNTER — Ambulatory Visit (INDEPENDENT_AMBULATORY_CARE_PROVIDER_SITE_OTHER): Admitting: Family Medicine

## 2024-09-21 VITALS — BP 128/78 | HR 70 | Temp 97.7°F | Ht 68.0 in | Wt 167.0 lb

## 2024-09-21 DIAGNOSIS — N5089 Other specified disorders of the male genital organs: Secondary | ICD-10-CM | POA: Diagnosis not present

## 2024-09-21 DIAGNOSIS — N453 Epididymo-orchitis: Secondary | ICD-10-CM | POA: Diagnosis not present

## 2024-09-21 MED ORDER — DOXYCYCLINE HYCLATE 100 MG PO TABS: 100.0000 mg | ORAL_TABLET | Freq: Two times a day (BID) | ORAL | 0 refills | Status: AC
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
## 2024-10-05 ENCOUNTER — Ambulatory Visit: Admitting: Urology

## 2024-10-05 VITALS — BP 129/80 | HR 70

## 2024-10-05 DIAGNOSIS — N5082 Scrotal pain: Secondary | ICD-10-CM | POA: Diagnosis not present

## 2024-10-05 DIAGNOSIS — N509 Disorder of male genital organs, unspecified: Secondary | ICD-10-CM | POA: Diagnosis not present

## 2024-10-05 DIAGNOSIS — N442 Benign cyst of testis: Secondary | ICD-10-CM

## 2024-10-05 DIAGNOSIS — E291 Testicular hypofunction: Secondary | ICD-10-CM

## 2024-10-05 LAB — URINALYSIS, ROUTINE W REFLEX MICROSCOPIC
Bilirubin, UA: NEGATIVE
Glucose, UA: NEGATIVE
Ketones, UA: NEGATIVE
Leukocytes,UA: NEGATIVE
Nitrite, UA: NEGATIVE
Protein,UA: NEGATIVE
RBC, UA: NEGATIVE
Specific Gravity, UA: <1.005 — ABNORMAL LOW (ref 1.005–1.030)
Urobilinogen, Ur: 0.2 mg/dL (ref 0.2–1.0)
pH, UA: 7 (ref 5.0–7.5)

## 2024-10-05 NOTE — Progress Notes (Unsigned)
 10/05/2024 11:15 AM   Terry [name] 01/10/1956 [id number]  Referring provider: Joesph Annabella HERO, FNP [address] [place],  [place] [address]  No chief complaint on file.   HPI:  F/u -     1) BPH - no prior meds or surgery. He felt poorly and had dysuria. Drank more and felt better. Then pain in left flank. UA many bacteria. Took abx - two rounds. Improved. May 2024 PSA 3.1. Has frequency and nocturia. Urgency. Slower stream. IPSS 11. His brother had BPH.  Rx daily tadalafil  Mar 2025. IPSS 4. He had GERD and stopped, but it worked.     2) ED - noticed more trouble getting and maintaining erection. He is on T replacement. Tried daily tadalafil  as above. Switched to prn tadalafil .   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 His Jun 2025 PSA 2.8. Sep 2025 Hct 40.5. T 221 (recent range 452 - 1382). He used topical testosterone . Wants to switch Rx to me. He's been on T for years. Had kids. Was on T gel packet - 50 mg / gm.    Today, added on for scrotal pain. He has mentioned this before, noted right testicle pain since summer 2025. Comes and goes. No radiation. Went to ED as it worsened. Nov 2025 scrotal US  - mildly asymmetrically increased color blood flow. This is nonspecific but could reflect orchitis. There is a 7 mm anechoic mass noted within the RIGHT testicle. This is favored to reflect a benign cyst. Recommended follow-up ultrasound in 6 months.  His UA was negative. He stopped testosterone  as it thought it could be related. Not as sexually active and wondered if that is related. He asked about tumor markers.   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    PMH: Past Medical History:  Diagnosis Date   Diabetes mellitus without complication (HCC)    on meds   Elevated blood sugar    Esophageal reflux    hx of   Essential hypertension, benign    on meds   Glaucoma    on meds (drops)   Hyperplasia of prostate    Internal hemorrhoids    Other and unspecified hyperlipidemia    on meds    Other testicular hypofunction    Sleep apnea    no longer uses CPAP- weight lost   Tubular adenoma of colon    Unspecified glaucoma(365.9)    Unspecified hemorrhoids without mention of complication     Surgical History: Past Surgical History:  Procedure Laterality Date   COLONOSCOPY  2019   JMP-MAC-suprep(good)-tics/int hems/polyps   DENTAL SURGERY  1972   LIPOMA EXCISION Right 2000   shoulder    Home Medications:  Allergies as of 10/05/2024       Reactions   Jardiance  [empagliflozin ]    Recurrent UTI   Penicillins Nausea And Vomiting        Medication List        Accurate as of October 05, 2024 11:15 AM. If you have any questions, ask your nurse or doctor.          amLODipine  5 MG tablet Commonly known as: NORVASC  Take 1 tablet (5 mg total) by mouth daily.   atorvastatin  20 MG tablet Commonly known as: LIPITOR Take 1 tablet (20 mg total) by mouth daily. as directed   bimatoprost 0.01 % Soln Commonly known as: LUMIGAN 1 drop at bedtime.   Cinnamon 500 MG Tabs Take 1 tablet by mouth 2 (two) times daily.  Dexcom G7 Sensor Misc Use continuously to monitor BGs. Change every 10 days. E11.9   dorzolamide-timolol 2-0.5 % ophthalmic solution Commonly known as: COSOPT Place 1 drop into both eyes 2 (two) times daily.   fluticasone  50 MCG/ACT nasal spray Commonly known as: FLONASE  USE 2 SPRAYS IN EACH NOSTRIL DAILY   FREESTYLE LITE test strip Generic drug: glucose blood TEST BLOOD SUGAR TWICE A DAY Dx E11.9   hydrochlorothiazide  12.5 MG capsule Commonly known as: MICROZIDE  Take 1 capsule (12.5 mg total) by mouth daily.   icosapent  Ethyl 1 g capsule Commonly known as: VASCEPA  Take 2 capsules (2 g total) by mouth 2 (two) times daily.   metFORMIN  500 MG 24 hr tablet Commonly known as: GLUCOPHAGE -XR Take 2 tablets (1,000 mg total) by mouth daily with breakfast.   olmesartan  40 MG tablet Commonly known as: BENICAR  Take 1 tablet (40 mg total) by mouth  daily.   Ozempic  (0.25 or 0.5 MG/DOSE) 2 MG/3ML Sopn Generic drug: Semaglutide (0.25 or 0.5MG /DOS) Inject 0.5 mg into the skin every 7 (seven) days.   tadalafil  5 MG tablet Commonly known as: CIALIS  Take 1 tablet (5 mg total) by mouth daily.   testosterone  50 MG/5GM (1%) Gel Commonly known as: ANDROGEL  Place 5 g onto the skin daily.   Vitamin D3 125 MCG (5000 UT) Caps Take 1 capsule by mouth 3 (three) times a week.        Allergies:  Allergies  Allergen Reactions   Jardiance  [Empagliflozin ]     Recurrent UTI   Penicillins Nausea And Vomiting    Family History: Family History  Problem Relation Age of Onset   Heart disease Mother    Stroke Father    Colon cancer Paternal Uncle        31's   Esophageal cancer Neg Hx    Rectal cancer Neg Hx    Stomach cancer Neg Hx     Social History:  reports that he has never smoked. He has never used smokeless tobacco. He reports that he does not currently use alcohol. He reports that he does not use drugs.   Physical Exam: BP 129/80   Pulse 70   Constitutional:  Alert and oriented, No acute distress. HEENT: [place] AT, moist mucus membranes.  Trachea midline, no masses. Cardiovascular: No clubbing, cyanosis, or edema. Respiratory: Normal respiratory effort, no increased work of breathing. GI: Abdomen is soft, nontender, nondistended, no abdominal masses GU: No CVA tenderness Skin: No rashes, bruises or suspicious lesions. Neurologic: Grossly intact, no focal deficits, moving all 4 extremities. Psychiatric: Normal mood and affect. GU: Penis circumcised, normal foreskin, testicles descended bilaterally and palpably normal, bilateral epididymis palpably normal, scrotum normal. DRE: Prostate about 40 g and smooth without hard area or nodule.  There was no inguinal lymphadenopathy or hernia but some laxity of the right inguinal canal.   Laboratory Data: Lab Results  Component Value Date   WBC 6.4 07/24/2024   HGB 13.8 07/24/2024    HCT 40.5 07/24/2024   MCV 93 07/24/2024   PLT 288 07/24/2024    Lab Results  Component Value Date   CREATININE 0.86 07/24/2024    Lab Results  Component Value Date   PSA 1.8 07/08/2014   PSA 1.7 10/15/2013    Lab Results  Component Value Date   TESTOSTERONE  221 (L) 07/24/2024    Lab Results  Component Value Date   HGBA1C 7.0 (H) 07/24/2024    Urinalysis    Component Value Date/Time   COLORURINE YELLOW 09/14/2024  1205   APPEARANCEUR CLEAR 09/14/2024 1205   APPEARANCEUR Clear 08/03/2024 1201   LABSPEC 1.018 09/14/2024 1205   PHURINE 6.0 09/14/2024 1205   GLUCOSEU NEGATIVE 09/14/2024 1205   HGBUR NEGATIVE 09/14/2024 1205   BILIRUBINUR NEGATIVE 09/14/2024 1205   BILIRUBINUR Negative 08/03/2024 1201   KETONESUR NEGATIVE 09/14/2024 1205   PROTEINUR NEGATIVE 09/14/2024 1205   UROBILINOGEN negative 07/08/2014 1041   UROBILINOGEN 0.2 10/26/2008 0722   NITRITE NEGATIVE 09/14/2024 1205   LEUKOCYTESUR NEGATIVE 09/14/2024 1205    Lab Results  Component Value Date   LABMICR Comment 08/03/2024   WBCUA >30 (A) 12/04/2023   RBCUA 0-2 02/24/2018   LABEPIT None seen 12/04/2023   MUCUS Present 06/26/2017   BACTERIA NONE SEEN 09/14/2024    Pertinent Imaging: Scrotal ultrasound November 2025 images reviewed.  Assessment & Plan:    1. Scrotal pain (Primary) Discussed benign nature. Exam and US  benign so he wouldn't need tumor markers. Test atrophy with T replacement discussed.  Usually not related to decreased sexual activity.  He does have some laxity in the right inguinal canal.  I will send him to Dr. Lovie for exam and possible cord block.  Patient is interested in evaluation.  Repeat ultrasound ordered.  - Urinalysis, Routine w reflex microscopic; Future - Urinalysis, Routine w reflex microscopic  2. Low T - F/u JUNE 2026 with labs prior .   No follow-ups on file.  Donnice Brooks, MD  [facility]  [address] [place], [place]  [address] [phone]

## 2024-10-19 LAB — OPHTHALMOLOGY REPORT-SCANNED

## 2024-10-21 ENCOUNTER — Ambulatory Visit (INDEPENDENT_AMBULATORY_CARE_PROVIDER_SITE_OTHER): Payer: TRICARE For Life (TFL)

## 2024-10-21 VITALS — BP 132/76 | HR 66 | Temp 97.8°F | Ht 68.0 in | Wt 168.0 lb

## 2024-10-21 DIAGNOSIS — Z0000 Encounter for general adult medical examination without abnormal findings: Secondary | ICD-10-CM

## 2024-10-21 NOTE — Progress Notes (Deleted)
 Chief Complaint  Patient presents with   Medicare Wellness     Subjective:   Terry [name] is a 68 y.o. male who presents for a Medicare Annual Wellness Visit.  Visit info / Clinical Intake: Medicare Wellness Visit Type:: Subsequent Annual Wellness Visit Persons participating in visit and providing information:: patient Medicare Wellness Visit Mode:: In-person (required for WTM) Pre-visit prep was completed: yes AWV questionnaire completed by patient prior to visit?: yes Date:: 10/20/24 Living arrangements:: (Patient-Rptd) lives with spouse/significant other Patient's Overall Health Status Rating: (Patient-Rptd) good Typical amount of pain: (Patient-Rptd) some Does pain affect daily life?: (Patient-Rptd) no  Dietary Habits and Nutritional Risks How many meals a day?: (Patient-Rptd) 3 Eats fruit and vegetables daily?: (Patient-Rptd) yes Most meals are obtained by: (Patient-Rptd) preparing own meals Diabetic:: (!) yes Any non-healing wounds?: no How often do you check your BS?: -- (every day) Would you like to be referred to a Nutritionist or for Diabetic Management? : no  Functional Status Activities of Daily Living (to include ambulation/medication): (Patient-Rptd) Independent Ambulation: (Patient-Rptd) Independent Medication Administration: (Patient-Rptd) Independent Home Management (perform basic housework or laundry): (Patient-Rptd) Independent Manage your own finances?: (Patient-Rptd) yes Primary transportation is: (Patient-Rptd) driving Concerns about vision?: -- (Dr. Devere [facility] in [place], [place]) Concerns about hearing?: no  Fall Screening Falls in the past year?: (Patient-Rptd) 1 Number of falls in past year: (Patient-Rptd) 0 Was there an injury with Fall?: (Patient-Rptd) 0 Fall Risk Category Calculator: (Patient-Rptd) 1 Patient Fall Risk Level: (Patient-Rptd) Low Fall Risk  Fall Risk Patient at Risk for Falls Due to: No Fall Risks Fall risk Follow  up: Falls evaluation completed; Education provided  Home and Transportation Safety: All rugs have non-skid backing?: (Patient-Rptd) yes All stairs or steps have railings?: (Patient-Rptd) yes Grab bars in the bathtub or shower?: (Patient-Rptd) yes Have non-skid surface in bathtub or shower?: (!) (Patient-Rptd) no Good home lighting?: (Patient-Rptd) yes Regular seat belt use?: (Patient-Rptd) yes Hospital stays in the last year:: (Patient-Rptd) no  Cognitive Assessment Difficulty concentrating, remembering, or making decisions? : no Will 6CIT or Mini Cog be Completed: yes What year is it?: 0 points What month is it?: 0 points Give patient an address phrase to remember (5 components): [address] [place] About what time is it?: 0 points Count backwards from 20 to 1: 0 points Say the months of the year in reverse: 0 points Repeat the address phrase from earlier: 0 points 6 CIT Score: 0 points  Advance Directives (For Healthcare) Does Patient Have a Medical Advance Directive?: Yes Type of Advance Directive: Healthcare Power of [place]; Living will Copy of Healthcare Power of Attorney in Chart?: No - copy requested Copy of Living Will in Chart?: No - copy requested  Reviewed/Updated  Reviewed/Updated: Reviewed All (Medical, Surgical, Family, Medications, Allergies, Care Teams, Patient Goals); Medical History; Surgical History; Family History; Medications; Care Teams; Patient Goals; Allergies    Allergies (verified) Jardiance  [empagliflozin ] and Penicillins   Current Medications (verified) Outpatient Encounter Medications as of 10/21/2024  Medication Sig   amLODipine  (NORVASC ) 5 MG tablet Take 1 tablet (5 mg total) by mouth daily.   atorvastatin  (LIPITOR) 20 MG tablet Take 1 tablet (20 mg total) by mouth daily. as directed   bimatoprost (LUMIGAN) 0.01 % SOLN 1 drop at bedtime.   Cholecalciferol (VITAMIN D3) 5000 units CAPS Take 1 capsule by mouth 3 (three) times a  week.   Cinnamon 500 MG TABS Take 1 tablet by mouth 2 (two) times daily.  Continuous Glucose Sensor (DEXCOM G7 SENSOR) MISC Use continuously to monitor BGs. Change every 10 days. E11.9   dorzolamide-timolol (COSOPT) 2-0.5 % ophthalmic solution Place 1 drop into both eyes 2 (two) times daily.   fluticasone  (FLONASE ) 50 MCG/ACT nasal spray USE 2 SPRAYS IN EACH NOSTRIL DAILY   glucose blood (FREESTYLE LITE) test strip TEST BLOOD SUGAR TWICE A DAY Dx E11.9   hydrochlorothiazide  (MICROZIDE ) 12.5 MG capsule Take 1 capsule (12.5 mg total) by mouth daily.   icosapent  Ethyl (VASCEPA ) 1 g capsule Take 2 capsules (2 g total) by mouth 2 (two) times daily.   metFORMIN  (GLUCOPHAGE -XR) 500 MG 24 hr tablet Take 2 tablets (1,000 mg total) by mouth daily with breakfast.   olmesartan  (BENICAR ) 40 MG tablet Take 1 tablet (40 mg total) by mouth daily.   Semaglutide ,0.25 or 0.5MG /DOS, (OZEMPIC , 0.25 OR 0.5 MG/DOSE,) 2 MG/3ML SOPN Inject 0.5 mg into the skin every 7 (seven) days.   tadalafil  (CIALIS ) 5 MG tablet Take 1 tablet (5 mg total) by mouth daily.   testosterone  (ANDROGEL ) 50 MG/5GM (1%) GEL Place 5 g onto the skin daily.   No facility-administered encounter medications on file as of 10/21/2024.    History: Past Medical History:  Diagnosis Date   Cataract 2020   Diabetes mellitus without complication (HCC)    on meds   Elevated blood sugar    Esophageal reflux    hx of   Essential hypertension, benign    on meds   Glaucoma    on meds (drops)   Hyperplasia of prostate    Internal hemorrhoids    Other and unspecified hyperlipidemia    on meds   Other testicular hypofunction    Sleep apnea    no longer uses CPAP- weight lost   Tubular adenoma of colon    Unspecified glaucoma(365.9)    Unspecified hemorrhoids without mention of complication    Past Surgical History:  Procedure Laterality Date   COLONOSCOPY  2019   JMP-MAC-suprep(good)-tics/int hems/polyps   DENTAL SURGERY  1972   EYE  SURGERY  2001   Radial Keritonomy   LIPOMA EXCISION Right 2000   shoulder   Family History  Problem Relation Age of Onset   Heart disease Mother    Arthritis Mother    Stroke Father    Colon cancer Paternal Uncle        30's   Diabetes Brother    Esophageal cancer Neg Hx    Rectal cancer Neg Hx    Stomach cancer Neg Hx    Social History   Occupational History   Not on file  Tobacco Use   Smoking status: Never   Smokeless tobacco: Never  Vaping Use   Vaping status: Never Used  Substance and Sexual Activity   Alcohol use: Never    Comment: VERY RARE   Drug use: Never   Sexual activity: Yes    Birth control/protection: None   Tobacco Counseling Counseling given: Yes  SDOH Screenings   Food Insecurity: No Food Insecurity (10/20/2024)  Housing: Low Risk (10/20/2024)  Transportation Needs: No Transportation Needs (10/20/2024)  Utilities: Not At Risk (10/21/2024)  Alcohol Screen: Low Risk (10/20/2024)  Depression (PHQ2-9): Low Risk (10/21/2024)  Financial Resource Strain: Low Risk (10/20/2024)  Physical Activity: Sufficiently Active (10/20/2024)  Social Connections: Socially Integrated (10/20/2024)  Stress: No Stress Concern Present (10/20/2024)  Tobacco Use: Low Risk (10/21/2024)  Health Literacy: Adequate Health Literacy (10/21/2024)   See flowsheets for full screening details  Depression Screen  PHQ 2 & 9 Depression Scale- Over the past 2 weeks, how often have you been bothered by any of the following problems? Little interest or pleasure in doing things: 0 Feeling down, depressed, or hopeless (PHQ Adolescent also includes...irritable): 0 PHQ-2 Total Score: 0 Trouble falling or staying asleep, or sleeping too much: 0 Feeling tired or having little energy: 0 Poor appetite or overeating (PHQ Adolescent also includes...weight loss): 0 Feeling bad about yourself - or that you are a failure or have let yourself or your family down: 0 Trouble concentrating on  things, such as reading the newspaper or watching television (PHQ Adolescent also includes...like school work): 0 Moving or speaking so slowly that other people could have noticed. Or the opposite - being so fidgety or restless that you have been moving around a lot more than usual: 0 Thoughts that you would be better off dead, or of hurting yourself in some way: 0 PHQ-9 Total Score: 0 If you checked off any problems, how difficult have these problems made it for you to do your work, take care of things at home, or get along with other people?: Not difficult at all  Depression Treatment Depression Interventions/Treatment : EYV7-0 Score <4 Follow-up Not Indicated     Goals Addressed             This Visit's Progress    Remain active and indpendent   On track            Objective:    Today's Vitals   10/21/24 1037  BP: 132/76  Pulse: 66  Temp: 97.8 F (36.6 C)  TempSrc: Oral  Weight: 168 lb (76.2 kg)  Height: 5' 8 (1.727 m)   Body mass index is 25.54 kg/m.  Hearing/Vision screen No results found. Immunizations and Health Maintenance Health Maintenance  Topic Date Due   FOOT EXAM  12/03/2024   HEMOGLOBIN A1C  01/21/2025   COVID-19 Vaccine (7 - Moderna risk 2025-26 season) 04/13/2025   Diabetic kidney evaluation - eGFR measurement  07/24/2025   Diabetic kidney evaluation - Urine ACR  07/24/2025   OPHTHALMOLOGY EXAM  10/19/2025   Medicare Annual Wellness (AWV)  10/21/2025   DTaP/Tdap/Td (3 - Td or Tdap) 07/02/2028   Colonoscopy  08/25/2030   Pneumococcal Vaccine: 50+ Years  Completed   Influenza Vaccine  Completed   Hepatitis C Screening  Completed   Zoster Vaccines- Shingrix   Completed   Meningococcal B Vaccine  Aged Out        Assessment/Plan:  This is a routine wellness examination for [place].  Patient Care Team: [name], DO as PCP - General (Family Medicine) Camillo Golas, MD as Attending Physician (Ophthalmology)  I have personally  reviewed and noted the following in the patients chart:   Medical and social history Use of alcohol, tobacco or illicit drugs  Current medications and supplements including opioid prescriptions. Functional ability and status Nutritional status Physical activity Advanced directives List of other physicians Hospitalizations, surgeries, and ER visits in previous 12 months Vitals Screenings to include cognitive, depression, and falls Referrals and appointments  No orders of the defined types were placed in this encounter.  In addition, I have reviewed and discussed with patient certain preventive protocols, quality metrics, and best practice recommendations. A written personalized care plan for preventive services as well as general preventive health recommendations were provided to patient.   Ozie Ned, CMA   10/21/2024   Return in 1 year (on 10/21/2025).  After Visit Summary: (  MyChart) Due to this being a telephonic visit, the after visit summary with patients personalized plan was offered to patient via MyChart   Nurse Notes: n/a

## 2024-10-21 NOTE — Patient Instructions (Signed)
 Mr. Weinand,  Thank you for taking the time for your Medicare Wellness Visit. I appreciate your continued commitment to your health goals. Please review the care plan we discussed, and feel free to reach out if I can assist you further.  Please note that Annual Wellness Visits do not include a physical exam. Some assessments may be limited, especially if the visit was conducted virtually. If needed, we may recommend an in-person follow-up with your provider.  Ongoing Care Seeing your primary care provider every 3 to 6 months helps us  monitor your health and provide consistent, personalized care.   Referrals If a referral was made during today's visit and you haven't received any updates within two weeks, please contact the referred provider directly to check on the status.  Recommended Screenings:  Health Maintenance  Topic Date Due   Medicare Annual Wellness Visit  10/20/2024   Complete foot exam   12/03/2024   Hemoglobin A1C  01/21/2025   COVID-19 Vaccine (7 - Moderna risk 2025-26 season) 04/13/2025   Yearly kidney function blood test for diabetes  07/24/2025   Yearly kidney health urinalysis for diabetes  07/24/2025   Eye exam for diabetics  10/19/2025   DTaP/Tdap/Td vaccine (3 - Td or Tdap) 07/02/2028   Colon Cancer Screening  08/25/2030   Pneumococcal Vaccine for age over 91  Completed   Flu Shot  Completed   Hepatitis C Screening  Completed   Zoster (Shingles) Vaccine  Completed   Meningitis B Vaccine  Aged Out       10/21/2024   10:40 AM  Advanced Directives  Does Patient Have a Medical Advance Directive? Yes  Type of Estate Agent of [place];Living will  Copy of Healthcare Power of Attorney in Chart? No - copy requested    Vision: Annual vision screenings are recommended for early detection of glaucoma, cataracts, and diabetic retinopathy. These exams can also reveal signs of chronic conditions such as diabetes and high blood pressure.  Dental:  Annual dental screenings help detect early signs of oral cancer, gum disease, and other conditions linked to overall health, including heart disease and diabetes.  Please see the attached documents for additional preventive care recommendations.

## 2024-10-26 ENCOUNTER — Other Ambulatory Visit: Payer: Self-pay | Admitting: Family Medicine

## 2024-10-26 ENCOUNTER — Other Ambulatory Visit: Payer: Self-pay

## 2024-10-26 DIAGNOSIS — E119 Type 2 diabetes mellitus without complications: Secondary | ICD-10-CM

## 2024-10-27 ENCOUNTER — Telehealth: Payer: Self-pay | Admitting: Family Medicine

## 2024-10-27 ENCOUNTER — Ambulatory Visit: Payer: Self-pay | Admitting: Family Medicine

## 2024-10-27 ENCOUNTER — Encounter: Payer: Self-pay | Admitting: Family Medicine

## 2024-10-27 VITALS — BP 127/78 | HR 98 | Temp 97.7°F | Ht 68.0 in | Wt 167.5 lb

## 2024-10-27 DIAGNOSIS — Z7985 Long-term (current) use of injectable non-insulin antidiabetic drugs: Secondary | ICD-10-CM | POA: Diagnosis not present

## 2024-10-27 DIAGNOSIS — F4320 Adjustment disorder, unspecified: Secondary | ICD-10-CM

## 2024-10-27 DIAGNOSIS — I152 Hypertension secondary to endocrine disorders: Secondary | ICD-10-CM | POA: Diagnosis not present

## 2024-10-27 DIAGNOSIS — E785 Hyperlipidemia, unspecified: Secondary | ICD-10-CM | POA: Diagnosis not present

## 2024-10-27 DIAGNOSIS — E1159 Type 2 diabetes mellitus with other circulatory complications: Secondary | ICD-10-CM | POA: Diagnosis not present

## 2024-10-27 DIAGNOSIS — E119 Type 2 diabetes mellitus without complications: Secondary | ICD-10-CM

## 2024-10-27 DIAGNOSIS — M546 Pain in thoracic spine: Secondary | ICD-10-CM | POA: Diagnosis not present

## 2024-10-27 DIAGNOSIS — E1169 Type 2 diabetes mellitus with other specified complication: Secondary | ICD-10-CM

## 2024-10-27 LAB — BAYER DCA HB A1C WAIVED: HB A1C (BAYER DCA - WAIVED): 6.6 % — ABNORMAL HIGH (ref 4.8–5.6)

## 2024-10-27 NOTE — Telephone Encounter (Signed)
 My understanding is that his urologist is managing his testosterone , which is what we were typically getting every 6 months.  He does not have to come in early for labs as we will likely only be checking A1c and possibly renal function.  However if he would just like to have these done prior to arrival glad to place.  Just let me know

## 2024-10-27 NOTE — Telephone Encounter (Signed)
 Pt wants to know if he needs to do labs before his next appt

## 2024-10-27 NOTE — Progress Notes (Signed)
 "  Subjective: CC:DM PCP: [name], DO Terry [name] is a 68 y.o. male presenting to clinic today for:  Type 2 Diabetes with hypertension, hyperlipidemia:  A1c noted to be 7.0 last visit.  He has been actively working on diet and exercise in efforts to reduce body weight and blood sugar.  Average blood sugars are running around 150s in the morning which is about 40 points lower than it had been.  His average estimated glucose is around 6.7.  He is compliant with all medications as prescribed.  Continues to have some waxing and waning stressors that are related to grief from his mother's passing, complex social situation with his sister and his wife's debilitating back pain.  ROS: He reports no chest pain, shortness of breath or visual disturbance.  Back pain He does report some right sided upper back pain that radiates into the shoulder.  Pain is worse with extension of the right upper extremity and or laying on the right side.  Denies any actual shoulder pain itself.  Ibuprofen 200 mg seems to alleviate pain.  No sensory changes or weakness reported.   Diabetes Health Maintenance Due  Topic Date Due   FOOT EXAM  12/03/2024   HEMOGLOBIN A1C  01/21/2025   OPHTHALMOLOGY EXAM  10/19/2025    ROS: Per HPI  Allergies[1] Past Medical History:  Diagnosis Date   Cataract 2020   Diabetes mellitus without complication (HCC)    on meds   Elevated blood sugar    Esophageal reflux    hx of   Essential hypertension, benign    on meds   Glaucoma    on meds (drops)   Hyperplasia of prostate    Internal hemorrhoids    Other and unspecified hyperlipidemia    on meds   Other testicular hypofunction    Sleep apnea    no longer uses CPAP- weight lost   Tubular adenoma of colon    Unspecified glaucoma(365.9)    Unspecified hemorrhoids without mention of complication    Current Medications[2] Social History   Socioeconomic History   Marital status: Married    Spouse  name: Not on file   Number of children: Not on file   Years of education: Not on file   Highest education level: Doctorate  Occupational History   Not on file  Tobacco Use   Smoking status: Never   Smokeless tobacco: Never  Vaping Use   Vaping status: Never Used  Substance and Sexual Activity   Alcohol use: Never    Comment: VERY RARE   Drug use: Never   Sexual activity: Yes    Birth control/protection: None  Other Topics Concern   Not on file  Social History Narrative   Not on file   Social Drivers of Health   Tobacco Use: Low Risk (10/27/2024)   Patient History    Smoking Tobacco Use: Never    Smokeless Tobacco Use: Never    Passive Exposure: Not on file  Financial Resource Strain: Low Risk (10/20/2024)   Overall Financial Resource Strain (CARDIA)    Difficulty of Paying Living Expenses: Not very hard  Food Insecurity: No Food Insecurity (10/20/2024)   Epic    Worried About Programme Researcher, Broadcasting/film/video in the Last Year: Never true    Ran Out of Food in the Last Year: Never true  Transportation Needs: No Transportation Needs (10/20/2024)   Epic    Lack of Transportation (Medical): No    Lack of Transportation (Non-Medical):  No  Physical Activity: Sufficiently Active (10/20/2024)   Exercise Vital Sign    Days of Exercise per Week: 4 days    Minutes of Exercise per Session: 60 min  Stress: No Stress Concern Present (10/20/2024)   [employer] of Occupational Health - Occupational Stress Questionnaire    Feeling of Stress: Only a little  Social Connections: Socially Integrated (10/20/2024)   Social Connection and Isolation Panel    Frequency of Communication with Friends and Family: More than three times a week    Frequency of Social Gatherings with Friends and Family: Once a week    Attends Religious Services: More than 4 times per year    Active Member of [employer] or Organizations: Yes    Attends Banker Meetings: 1 to 4 times per year    Marital Status:  Married  Catering Manager Violence: Not At Risk (10/21/2024)   Epic    Fear of Current or Ex-Partner: No    Emotionally Abused: No    Physically Abused: No    Sexually Abused: No  Depression (PHQ2-9): Low Risk (10/27/2024)   Depression (PHQ2-9)    PHQ-2 Score: 1  Alcohol Screen: Low Risk (10/20/2024)   Alcohol Screen    Last Alcohol Screening Score (AUDIT): 0  Housing: Low Risk (10/20/2024)   Epic    Unable to Pay for Housing in the Last Year: No    Number of Times Moved in the Last Year: 0    Homeless in the Last Year: No  Utilities: Not At Risk (10/21/2024)   Epic    Threatened with loss of utilities: No  Health Literacy: Adequate Health Literacy (10/21/2024)   B1300 Health Literacy    Frequency of need for help with medical instructions: Never   Family History  Problem Relation Age of Onset   Heart disease Mother    Arthritis Mother    Stroke Father    Colon cancer Paternal Uncle        61's   Diabetes Brother    Esophageal cancer Neg Hx    Rectal cancer Neg Hx    Stomach cancer Neg Hx     Objective: Office vital signs reviewed. BP 127/78   Pulse 98   Temp 97.7 F (36.5 C)   Ht 5' 8 (1.727 m)   Wt 167 lb 8 oz (76 kg)   SpO2 95%   BMI 25.47 kg/m   Physical Examination:  General: Awake, alert, well nourished, No acute distress HEENT: sclera white, MMM Cardio: regular rate and rhythm, S1S2 heard, no murmurs appreciated Pulm: clear to auscultation bilaterally, no wheezes, rhonchi or rales; normal work of breathing on room air MSK: Has full active range of motion in flexion of the shoulder as well as abduction.  Limited internal rotation of the right shoulder.  Negative Hawkins.  Negative empty can.  Has tenderness palpation just above the scapula into the thoracic paraspinals/trapezius.  Increased tonicity of these muscles.   Lab Results  Component Value Date   HGBA1C 7.0 (H) 07/24/2024       10/27/2024    2:41 PM 10/21/2024   10:44 AM 09/21/2024     9:10 AM  Depression screen PHQ 2/9  Decreased Interest 0 0 0  Down, Depressed, Hopeless 0 0 0  PHQ - 2 Score 0 0 0  Altered sleeping 1 0 0  Tired, decreased energy 0 0 0  Change in appetite 0 0 0  Feeling bad or failure about yourself  0  0 0  Trouble concentrating 0 0 0  Moving slowly or fidgety/restless 0 0 0  Suicidal thoughts 0 0 0  PHQ-9 Score 1 0 0  Difficult doing work/chores Not difficult at all Not difficult at all Not difficult at all      10/27/2024    2:41 PM 09/21/2024    9:11 AM 07/27/2024    9:00 AM 12/04/2023    3:29 PM  GAD 7 : Generalized Anxiety Score  Nervous, Anxious, on Edge 0 0 0 0  Control/stop worrying 0 0 0 0  Worry too much - different things 0 0 1 0  Trouble relaxing 0 0 0 0  Restless 0 0 0 0  Easily annoyed or irritable 0 0 1 0  Afraid - awful might happen 0 0 0 0  Total GAD 7 Score 0 0 2 0  Anxiety Difficulty Not difficult at all Not difficult at all Not difficult at all Not difficult at all    Assessment/ Plan: 68 y.o. male   Diabetes mellitus treated with injections of non-insulin medication (HCC) - Plan: Bayer DCA Hb A1c Waived  Hyperlipidemia associated with type 2 diabetes mellitus (HCC)  Hypertension associated with diabetes (HCC)  Grief reaction  Acute right-sided thoracic back pain  Check A1c.  Anticipate controlled blood sugar.  May extend visit out to 4 months.  Not due for fasting labs.  Blood pressure well-controlled.  Continue all medications as prescribed  Dealing with grief.  Has good support system  Suspect muscular in nature as he had negative eval for any rotator cuff involvement.  We discussed strengthening of pectoralis.  Also discussed stretches and a handout was provided with these home physical therapy exercises.  At this time, little to suggest that he has cervical or over shoulder etiology of the upper back pain but if symptoms are persistent could consider referral to physical therapy versus  orthopedics   Perrie Ragin CHRISTELLA Fielding, DO [facility] [phone]      [1]  Allergies Allergen Reactions   Jardiance  [Empagliflozin ]     Recurrent UTI   Penicillins Nausea And Vomiting  [2]  Current Outpatient Medications:    amLODipine  (NORVASC ) 5 MG tablet, Take 1 tablet (5 mg total) by mouth daily., Disp: 90 tablet, Rfl: 3   atorvastatin  (LIPITOR) 20 MG tablet, Take 1 tablet (20 mg total) by mouth daily. as directed, Disp: 90 tablet, Rfl: 3   bimatoprost (LUMIGAN) 0.01 % SOLN, 1 drop at bedtime., Disp: , Rfl:    Cholecalciferol (VITAMIN D3) 5000 units CAPS, Take 1 capsule by mouth 3 (three) times a week., Disp: , Rfl:    Cinnamon 500 MG TABS, Take 1 tablet by mouth 2 (two) times daily., Disp: , Rfl:    dorzolamide-timolol (COSOPT) 2-0.5 % ophthalmic solution, Place 1 drop into both eyes 2 (two) times daily., Disp: , Rfl:    fluticasone  (FLONASE ) 50 MCG/ACT nasal spray, USE 2 SPRAYS IN EACH NOSTRIL DAILY, Disp: 48 g, Rfl: 3   glucose blood (FREESTYLE LITE) test strip, TEST BLOOD SUGAR TWICE A DAY Dx E11.9, Disp: 200 strip, Rfl: 3   hydrochlorothiazide  (MICROZIDE ) 12.5 MG capsule, Take 1 capsule (12.5 mg total) by mouth daily., Disp: 90 capsule, Rfl: 3   icosapent  Ethyl (VASCEPA ) 1 g capsule, Take 2 capsules (2 g total) by mouth 2 (two) times daily., Disp: 360 capsule, Rfl: 3   metFORMIN  (GLUCOPHAGE -XR) 500 MG 24 hr tablet, Take 2 tablets (1,000 mg total) by mouth daily  with breakfast., Disp: 180 tablet, Rfl: 3   olmesartan  (BENICAR ) 40 MG tablet, Take 1 tablet (40 mg total) by mouth daily., Disp: 90 tablet, Rfl: 1   Semaglutide ,0.25 or 0.5MG /DOS, (OZEMPIC , 0.25 OR 0.5 MG/DOSE,) 2 MG/3ML SOPN, Inject 0.5 mg into the skin every 7 (seven) days., Disp: 9 mL, Rfl: 3   testosterone  (ANDROGEL ) 50 MG/5GM (1%) GEL, Place 5 g onto the skin daily., Disp: 150 g, Rfl: 5   Continuous Glucose Sensor (DEXCOM G7 SENSOR) MISC, Use continuously to monitor BGs. Change every 10  days. E11.9 (Patient not taking: Reported on 10/27/2024), Disp: 9 each, Rfl: 4   tadalafil  (CIALIS ) 5 MG tablet, Take 1 tablet (5 mg total) by mouth daily. (Patient not taking: Reported on 10/27/2024), Disp: 90 tablet, Rfl: 3  "

## 2024-10-27 NOTE — Progress Notes (Deleted)
 "  Subjective: CC:*** PCP: Terry Norene HERO, DO YEP:Terry [name] is a 68 y.o. male presenting to clinic today for:  ***   ROS: Per HPI  Allergies[1] Past Medical History:  Diagnosis Date   Cataract 2020   Diabetes mellitus without complication (HCC)    on meds   Elevated blood sugar    Esophageal reflux    hx of   Essential hypertension, benign    on meds   Glaucoma    on meds (drops)   Hyperplasia of prostate    Internal hemorrhoids    Other and unspecified hyperlipidemia    on meds   Other testicular hypofunction    Sleep apnea    no longer uses CPAP- weight lost   Tubular adenoma of colon    Unspecified glaucoma(365.9)    Unspecified hemorrhoids without mention of complication    Current Medications[2] Social History   Socioeconomic History   Marital status: Married    Spouse name: Not on file   Number of children: Not on file   Years of education: Not on file   Highest education level: Doctorate  Occupational History   Not on file  Tobacco Use   Smoking status: Never   Smokeless tobacco: Never  Vaping Use   Vaping status: Never Used  Substance and Sexual Activity   Alcohol use: Never    Comment: VERY RARE   Drug use: Never   Sexual activity: Yes    Birth control/protection: None  Other Topics Concern   Not on file  Social History Narrative   Not on file   Social Drivers of Health   Tobacco Use: Low Risk (10/21/2024)   Patient History    Smoking Tobacco Use: Never    Smokeless Tobacco Use: Never    Passive Exposure: Not on file  Financial Resource Strain: Low Risk (10/20/2024)   Overall Financial Resource Strain (CARDIA)    Difficulty of Paying Living Expenses: Not very hard  Food Insecurity: No Food Insecurity (10/20/2024)   Epic    Worried About Radiation Protection Practitioner of Food in the Last Year: Never true    Ran Out of Food in the Last Year: Never true  Transportation Needs: No Transportation Needs (10/20/2024)   Epic    Lack of  Transportation (Medical): No    Lack of Transportation (Non-Medical): No  Physical Activity: Sufficiently Active (10/20/2024)   Exercise Vital Sign    Days of Exercise per Week: 4 days    Minutes of Exercise per Session: 60 min  Stress: No Stress Concern Present (10/20/2024)   [employer] of Occupational Health - Occupational Stress Questionnaire    Feeling of Stress: Only a little  Social Connections: Socially Integrated (10/20/2024)   Social Connection and Isolation Panel    Frequency of Communication with Friends and Family: More than three times a week    Frequency of Social Gatherings with Friends and Family: Once a week    Attends Religious Services: More than 4 times per year    Active Member of Clubs or Organizations: Yes    Attends Banker Meetings: 1 to 4 times per year    Marital Status: Married  Catering Manager Violence: Not At Risk (10/21/2024)   Epic    Fear of Current or Ex-Partner: No    Emotionally Abused: No    Physically Abused: No    Sexually Abused: No  Depression (PHQ2-9): Low Risk (10/21/2024)   Depression (PHQ2-9)    PHQ-2 Score: 0  Alcohol Screen: Low Risk (10/20/2024)   Alcohol Screen    Last Alcohol Screening Score (AUDIT): 0  Housing: Low Risk (10/20/2024)   Epic    Unable to Pay for Housing in the Last Year: No    Number of Times Moved in the Last Year: 0    Homeless in the Last Year: No  Utilities: Not At Risk (10/21/2024)   Epic    Threatened with loss of utilities: No  Health Literacy: Adequate Health Literacy (10/21/2024)   B1300 Health Literacy    Frequency of need for help with medical instructions: Never   Family History  Problem Relation Age of Onset   Heart disease Mother    Arthritis Mother    Stroke Father    Colon cancer Paternal Uncle        43's   Diabetes Brother    Esophageal cancer Neg Hx    Rectal cancer Neg Hx    Stomach cancer Neg Hx     Objective: Office vital signs reviewed. There were no  vitals taken for this visit.  Physical Examination:  General: Awake, alert, *** nourished, No acute distress HEENT: Normal    Neck: No masses palpated. No lymphadenopathy    Ears: Tympanic membranes intact, normal light reflex, no erythema, no bulging    Eyes: PERRLA, extraocular membranes intact, sclera ***    Nose: nasal turbinates moist, *** nasal discharge    Throat: moist mucus membranes, no erythema, *** tonsillar exudate.  Airway is patent Cardio: regular rate and rhythm, S1S2 heard, no murmurs appreciated Pulm: clear to auscultation bilaterally, no wheezes, rhonchi or rales; normal work of breathing on room air GI: soft, non-tender, non-distended, bowel sounds present x4, no hepatomegaly, no splenomegaly, no masses GU: external vaginal tissue ***, cervix ***, *** punctate lesions on cervix appreciated, *** discharge from cervical os, *** bleeding, *** cervical motion tenderness, *** abdominal/ adnexal masses Extremities: warm, well perfused, No edema, cyanosis or clubbing; +*** pulses bilaterally MSK: *** gait and *** station Skin: dry; intact; no rashes or lesions Neuro: *** Strength and light touch sensation grossly intact, *** DTRs ***/4  Assessment/ Plan: 68 y.o. male   No diagnosis found.   ***   Terry Wiechman CHRISTELLA Fielding, DO [facility] [phone]     [1]  Allergies Allergen Reactions   Jardiance  [Empagliflozin ]     Recurrent UTI   Penicillins Nausea And Vomiting  [2]  Current Outpatient Medications:    amLODipine  (NORVASC ) 5 MG tablet, Take 1 tablet (5 mg total) by mouth daily., Disp: 90 tablet, Rfl: 3   atorvastatin  (LIPITOR) 20 MG tablet, Take 1 tablet (20 mg total) by mouth daily. as directed, Disp: 90 tablet, Rfl: 3   bimatoprost (LUMIGAN) 0.01 % SOLN, 1 drop at bedtime., Disp: , Rfl:    Cholecalciferol (VITAMIN D3) 5000 units CAPS, Take 1 capsule by mouth 3 (three) times a week., Disp: , Rfl:    Cinnamon 500 MG TABS, Take 1 tablet  by mouth 2 (two) times daily., Disp: , Rfl:    Continuous Glucose Sensor (DEXCOM G7 SENSOR) MISC, Use continuously to monitor BGs. Change every 10 days. E11.9, Disp: 9 each, Rfl: 4   dorzolamide-timolol (COSOPT) 2-0.5 % ophthalmic solution, Place 1 drop into both eyes 2 (two) times daily., Disp: , Rfl:    fluticasone  (FLONASE ) 50 MCG/ACT nasal spray, USE 2 SPRAYS IN EACH NOSTRIL DAILY, Disp: 48 g, Rfl: 3   glucose blood (FREESTYLE LITE) test strip, TEST BLOOD SUGAR  TWICE A DAY Dx E11.9, Disp: 200 strip, Rfl: 3   hydrochlorothiazide  (MICROZIDE ) 12.5 MG capsule, Take 1 capsule (12.5 mg total) by mouth daily., Disp: 90 capsule, Rfl: 3   icosapent  Ethyl (VASCEPA ) 1 g capsule, Take 2 capsules (2 g total) by mouth 2 (two) times daily., Disp: 360 capsule, Rfl: 3   metFORMIN  (GLUCOPHAGE -XR) 500 MG 24 hr tablet, Take 2 tablets (1,000 mg total) by mouth daily with breakfast., Disp: 180 tablet, Rfl: 3   olmesartan  (BENICAR ) 40 MG tablet, Take 1 tablet (40 mg total) by mouth daily., Disp: 90 tablet, Rfl: 1   Semaglutide ,0.25 or 0.5MG /DOS, (OZEMPIC , 0.25 OR 0.5 MG/DOSE,) 2 MG/3ML SOPN, Inject 0.5 mg into the skin every 7 (seven) days., Disp: 9 mL, Rfl: 3   tadalafil  (CIALIS ) 5 MG tablet, Take 1 tablet (5 mg total) by mouth daily., Disp: 90 tablet, Rfl: 3   testosterone  (ANDROGEL ) 50 MG/5GM (1%) GEL, Place 5 g onto the skin daily., Disp: 150 g, Rfl: 5  "

## 2024-11-02 ENCOUNTER — Ambulatory Visit: Admitting: Urology

## 2024-11-03 NOTE — Progress Notes (Signed)
 "  Chief Complaint  Patient presents with   Medicare Wellness     Subjective:   Terry [name] is a 68 y.o. male who presents for a Medicare Annual Wellness Visit.  Visit info / Clinical Intake: Medicare Wellness Visit Type:: Subsequent Annual Wellness Visit Persons participating in visit and providing information:: patient Medicare Wellness Visit Mode:: In-person (required for WTM) Pre-visit prep was completed: yes AWV questionnaire completed by patient prior to visit?: yes Date:: 10/20/24 Living arrangements:: (Patient-Rptd) lives with spouse/significant other Patient's Overall Health Status Rating: (Patient-Rptd) good Typical amount of pain: (Patient-Rptd) some Does pain affect daily life?: (Patient-Rptd) no  Dietary Habits and Nutritional Risks How many meals a day?: (Patient-Rptd) 3 Eats fruit and vegetables daily?: (Patient-Rptd) yes Most meals are obtained by: (Patient-Rptd) preparing own meals Diabetic:: (!) yes Any non-healing wounds?: no How often do you check your BS?: -- (every day) Would you like to be referred to a Nutritionist or for Diabetic Management? : no  Functional Status Activities of Daily Living (to include ambulation/medication): (Patient-Rptd) Independent Ambulation: (Patient-Rptd) Independent Medication Administration: (Patient-Rptd) Independent Home Management (perform basic housework or laundry): (Patient-Rptd) Independent Manage your own finances?: (Patient-Rptd) yes Primary transportation is: (Patient-Rptd) driving Concerns about vision?: -- (Dr. [facility] in [place] [employer], [place]) Concerns about hearing?: no  Fall Screening Falls in the past year?: 1 Number of falls in past year: 0 Was there an injury with Fall?: 0 Fall Risk Category Calculator: 1 Patient Fall Risk Level: Low Fall Risk  Fall Risk Patient at Risk for Falls Due to: No Fall Risks Fall risk Follow up: Falls evaluation completed; Education provided  Home and Transportation  Safety: All rugs have non-skid backing?: (Patient-Rptd) yes All stairs or steps have railings?: (Patient-Rptd) yes Grab bars in the bathtub or shower?: (Patient-Rptd) yes Have non-skid surface in bathtub or shower?: (!) (Patient-Rptd) no Good home lighting?: (Patient-Rptd) yes Regular seat belt use?: (Patient-Rptd) yes Hospital stays in the last year:: (Patient-Rptd) no  Cognitive Assessment Difficulty concentrating, remembering, or making decisions? : no Will 6CIT or Mini Cog be Completed: yes What year is it?: 0 points What month is it?: 0 points Give patient an address phrase to remember (5 components): [address] [place] About what time is it?: 0 points Count backwards from 20 to 1: 0 points Say the months of the year in reverse: 0 points Repeat the address phrase from earlier: 0 points 6 CIT Score: 0 points  Advance Directives (For Healthcare) Does Patient Have a Medical Advance Directive?: Yes Type of Advance Directive: Healthcare Power of Rosalie; Living will Copy of Healthcare Power of Attorney in Chart?: No - copy requested Copy of Living Will in Chart?: No - copy requested  Reviewed/Updated  Reviewed/Updated: Reviewed All (Medical, Surgical, Family, Medications, Allergies, Care Teams, Patient Goals); Medical History; Surgical History; Family History; Medications; Care Teams; Patient Goals; Allergies    Allergies (verified) Jardiance  [empagliflozin ] and Penicillins   Current Medications (verified) Outpatient Encounter Medications as of 10/21/2024  Medication Sig   amLODipine  (NORVASC ) 5 MG tablet Take 1 tablet (5 mg total) by mouth daily.   atorvastatin  (LIPITOR) 20 MG tablet Take 1 tablet (20 mg total) by mouth daily. as directed   bimatoprost (LUMIGAN) 0.01 % SOLN 1 drop at bedtime.   Cholecalciferol (VITAMIN D3) 5000 units CAPS Take 1 capsule by mouth 3 (three) times a week.   Cinnamon 500 MG TABS Take 1 tablet by mouth 2 (two) times daily.    Continuous Glucose  Sensor (DEXCOM G7 SENSOR) MISC Use continuously to monitor BGs. Change every 10 days. E11.9 (Patient not taking: Reported on 10/27/2024)   dorzolamide-timolol (COSOPT) 2-0.5 % ophthalmic solution Place 1 drop into both eyes 2 (two) times daily.   fluticasone  (FLONASE ) 50 MCG/ACT nasal spray USE 2 SPRAYS IN EACH NOSTRIL DAILY   glucose blood (FREESTYLE LITE) test strip TEST BLOOD SUGAR TWICE A DAY Dx E11.9   hydrochlorothiazide  (MICROZIDE ) 12.5 MG capsule Take 1 capsule (12.5 mg total) by mouth daily.   icosapent  Ethyl (VASCEPA ) 1 g capsule Take 2 capsules (2 g total) by mouth 2 (two) times daily.   metFORMIN  (GLUCOPHAGE -XR) 500 MG 24 hr tablet Take 2 tablets (1,000 mg total) by mouth daily with breakfast.   olmesartan  (BENICAR ) 40 MG tablet Take 1 tablet (40 mg total) by mouth daily.   Semaglutide ,0.25 or 0.5MG /DOS, (OZEMPIC , 0.25 OR 0.5 MG/DOSE,) 2 MG/3ML SOPN Inject 0.5 mg into the skin every 7 (seven) days.   tadalafil  (CIALIS ) 5 MG tablet Take 1 tablet (5 mg total) by mouth daily. (Patient not taking: Reported on 10/27/2024)   testosterone  (ANDROGEL ) 50 MG/5GM (1%) GEL Place 5 g onto the skin daily.   No facility-administered encounter medications on file as of 10/21/2024.    History: Past Medical History:  Diagnosis Date   Cataract 2020   Diabetes mellitus without complication (HCC)    on meds   Elevated blood sugar    Esophageal reflux    hx of   Essential hypertension, benign    on meds   Glaucoma    on meds (drops)   Hyperplasia of prostate    Internal hemorrhoids    Other and unspecified hyperlipidemia    on meds   Other testicular hypofunction    Sleep apnea    no longer uses CPAP- weight lost   Tubular adenoma of colon    Unspecified glaucoma(365.9)    Unspecified hemorrhoids without mention of complication    Past Surgical History:  Procedure Laterality Date   COLONOSCOPY  2019   JMP-MAC-suprep(good)-tics/int hems/polyps   DENTAL SURGERY  1972    EYE SURGERY  2001   Radial Keritonomy   LIPOMA EXCISION Right 2000   shoulder   Family History  Problem Relation Age of Onset   Heart disease Mother    Arthritis Mother    Stroke Father    Colon cancer Paternal Uncle        9's   Diabetes Brother    Esophageal cancer Neg Hx    Rectal cancer Neg Hx    Stomach cancer Neg Hx    Social History   Occupational History   Not on file  Tobacco Use   Smoking status: Never   Smokeless tobacco: Never  Vaping Use   Vaping status: Never Used  Substance and Sexual Activity   Alcohol use: Never    Comment: VERY RARE   Drug use: Never   Sexual activity: Yes    Birth control/protection: None   Tobacco Counseling Counseling given: Yes  SDOH Screenings   Food Insecurity: No Food Insecurity (10/20/2024)  Housing: Low Risk (10/20/2024)  Transportation Needs: No Transportation Needs (10/20/2024)  Utilities: Not At Risk (10/21/2024)  Alcohol Screen: Low Risk (10/20/2024)  Depression (PHQ2-9): Low Risk (10/27/2024)  Financial Resource Strain: Low Risk (10/20/2024)  Physical Activity: Sufficiently Active (10/20/2024)  Social Connections: Socially Integrated (10/20/2024)  Stress: No Stress Concern Present (10/20/2024)  Tobacco Use: Low Risk (10/27/2024)  Health Literacy: Adequate Health Literacy (10/21/2024)  See flowsheets for full screening details  Depression Screen PHQ 2 & 9 Depression Scale- Over the past 2 weeks, how often have you been bothered by any of the following problems? Little interest or pleasure in doing things: 0 Feeling down, depressed, or hopeless (PHQ Adolescent also includes...irritable): 0 PHQ-2 Total Score: 0 Trouble falling or staying asleep, or sleeping too much: 1 Feeling tired or having little energy: 0 Poor appetite or overeating (PHQ Adolescent also includes...weight loss): 0 Feeling bad about yourself - or that you are a failure or have let yourself or your family down: 0 Trouble concentrating on  things, such as reading the newspaper or watching television (PHQ Adolescent also includes...like school work): 0 Moving or speaking so slowly that other people could have noticed. Or the opposite - being so fidgety or restless that you have been moving around a lot more than usual: 0 Thoughts that you would be better off dead, or of hurting yourself in some way: 0 PHQ-9 Total Score: 1 If you checked off any problems, how difficult have these problems made it for you to do your work, take care of things at home, or get along with other people?: Not difficult at all  Depression Treatment Depression Interventions/Treatment : EYV7-0 Score <4 Follow-up Not Indicated     Goals Addressed             This Visit's Progress    Remain active and indpendent   On track            Objective:    Today's Vitals   10/21/24 1037  BP: 132/76  Pulse: 66  Temp: 97.8 F (36.6 C)  TempSrc: Oral  Weight: 168 lb (76.2 kg)  Height: 5' 8 (1.727 m)   Body mass index is 25.54 kg/m.  Hearing/Vision screen No results found. Immunizations and Health Maintenance Health Maintenance  Topic Date Due   FOOT EXAM  12/03/2024   COVID-19 Vaccine (7 - Moderna risk 2025-26 season) 04/13/2025   HEMOGLOBIN A1C  04/27/2025   Diabetic kidney evaluation - eGFR measurement  07/24/2025   Diabetic kidney evaluation - Urine ACR  07/24/2025   OPHTHALMOLOGY EXAM  10/19/2025   Medicare Annual Wellness (AWV)  10/21/2025   DTaP/Tdap/Td (3 - Td or Tdap) 07/02/2028   Colonoscopy  08/25/2030   Pneumococcal Vaccine: 50+ Years  Completed   Influenza Vaccine  Completed   Hepatitis C Screening  Completed   Zoster Vaccines- Shingrix   Completed   Meningococcal B Vaccine  Aged Out        Assessment/Plan:  This is a routine wellness examination for Graf.  Patient Care Team: [name], DO as PCP - General (Family Medicine) Camillo Golas, MD as Attending Physician (Ophthalmology)  I have personally  reviewed and noted the following in the patients chart:   Medical and social history Use of alcohol, tobacco or illicit drugs  Current medications and supplements including opioid prescriptions. Functional ability and status Nutritional status Physical activity Advanced directives List of other physicians Hospitalizations, surgeries, and ER visits in previous 12 months Vitals Screenings to include cognitive, depression, and falls Referrals and appointments  No orders of the defined types were placed in this encounter.  In addition, I have reviewed and discussed with patient certain preventive protocols, quality metrics, and best practice recommendations. A written personalized care plan for preventive services as well as general preventive health recommendations were provided to patient.   Ozie Ned, CMA   11/03/2024   Return  in 1 year (on 10/21/2025).  After Visit Summary: (MyChart) Due to this being a telephonic visit, the after visit summary with patients personalized plan was offered to patient via MyChart   Nurse Notes: n/a "

## 2025-01-11 ENCOUNTER — Ambulatory Visit: Admitting: Nutrition

## 2025-02-08 ENCOUNTER — Ambulatory Visit: Admitting: Urology

## 2025-02-26 ENCOUNTER — Ambulatory Visit: Admitting: Family Medicine

## 2025-04-05 ENCOUNTER — Ambulatory Visit: Admitting: Urology

## 2025-08-03 ENCOUNTER — Encounter: Admitting: Family Medicine

## 2025-10-22 ENCOUNTER — Ambulatory Visit
